# Patient Record
Sex: Male | Born: 1965 | Race: White | Hispanic: No | Marital: Married | Smoking: Never smoker
Health system: Southern US, Community
[De-identification: ages and names within clinical notes are randomized; demographics above are authoritative.]

## PROBLEM LIST (undated history)

## (undated) DIAGNOSIS — R1310 Dysphagia, unspecified: Secondary | ICD-10-CM

## (undated) DIAGNOSIS — Z87442 Personal history of urinary calculi: Secondary | ICD-10-CM

## (undated) DIAGNOSIS — E119 Type 2 diabetes mellitus without complications: Secondary | ICD-10-CM

## (undated) DIAGNOSIS — Z8619 Personal history of other infectious and parasitic diseases: Secondary | ICD-10-CM

## (undated) DIAGNOSIS — N200 Calculus of kidney: Secondary | ICD-10-CM

## (undated) DIAGNOSIS — K219 Gastro-esophageal reflux disease without esophagitis: Secondary | ICD-10-CM

## (undated) DIAGNOSIS — T7840XA Allergy, unspecified, initial encounter: Secondary | ICD-10-CM

## (undated) DIAGNOSIS — I10 Essential (primary) hypertension: Secondary | ICD-10-CM

## (undated) DIAGNOSIS — E7800 Pure hypercholesterolemia, unspecified: Secondary | ICD-10-CM

## (undated) HISTORY — DX: Pure hypercholesterolemia, unspecified: E78.00

## (undated) HISTORY — DX: Type 2 diabetes mellitus without complications: E11.9

## (undated) HISTORY — DX: Personal history of urinary calculi: Z87.442

## (undated) HISTORY — DX: Personal history of other infectious and parasitic diseases: Z86.19

## (undated) HISTORY — DX: Gastro-esophageal reflux disease without esophagitis: K21.9

## (undated) HISTORY — DX: Allergy, unspecified, initial encounter: T78.40XA

## (undated) HISTORY — DX: Calculus of kidney: N20.0

## (undated) HISTORY — DX: Essential (primary) hypertension: I10

## (undated) HISTORY — DX: Dysphagia, unspecified: R13.10

## (undated) HISTORY — PX: ESOPHAGEAL DILATION: SHX303

---
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
## 2007-01-10 ENCOUNTER — Emergency Department: Payer: Self-pay | Admitting: Emergency Medicine

## 2007-11-16 IMAGING — CT CT ABD-PELV W/O CM
1 of 2 series · 15 of 32 positions shown, 19 images · non-contrast
Comparison: none

REASON FOR EXAM: (1) L flank and abdominal pain,  stone protocol; (2) L
flank and abd pain
COMMENTS:

[Series 2: soft tissue · axial · 0.72mm/px · z∈[-1122,-678]mm · 15 of 168 slices shown, 19 images]
[im 13/168  soft-tissue]
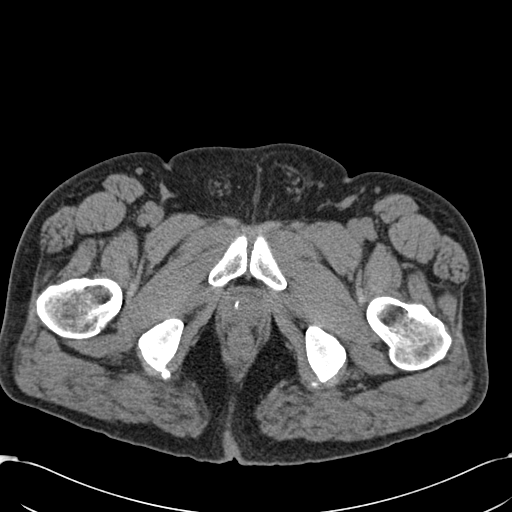
[im 13/168  bone]
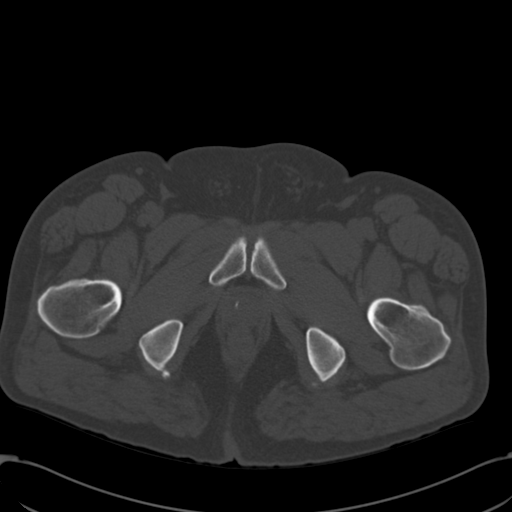
[im 25/168  soft-tissue]
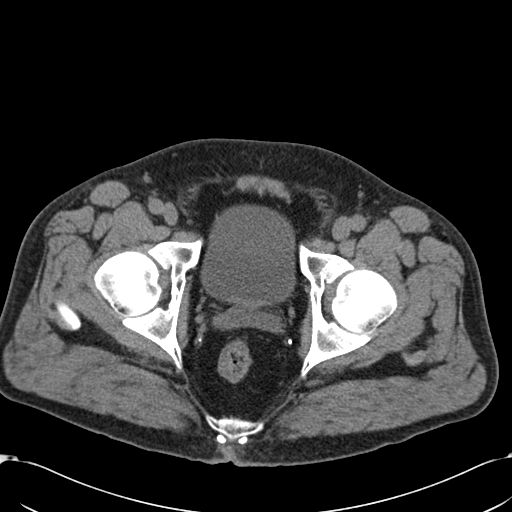
[im 38/168  soft-tissue]
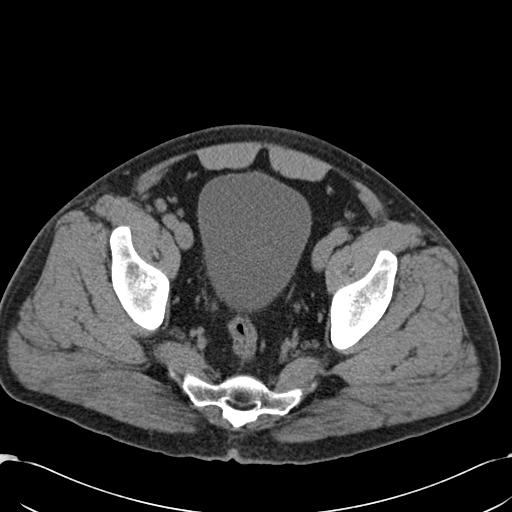
[im 50/168  soft-tissue]
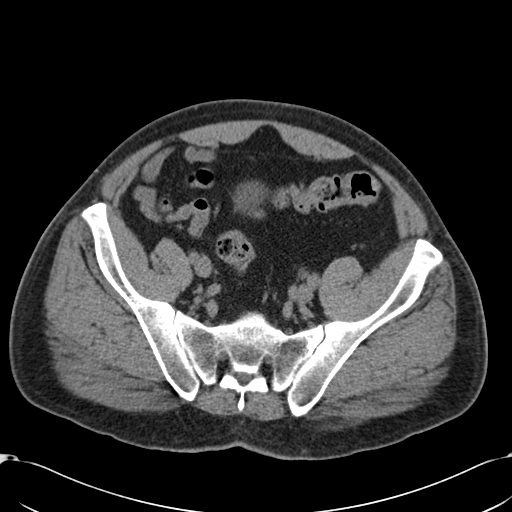
[im 62/168  soft-tissue]
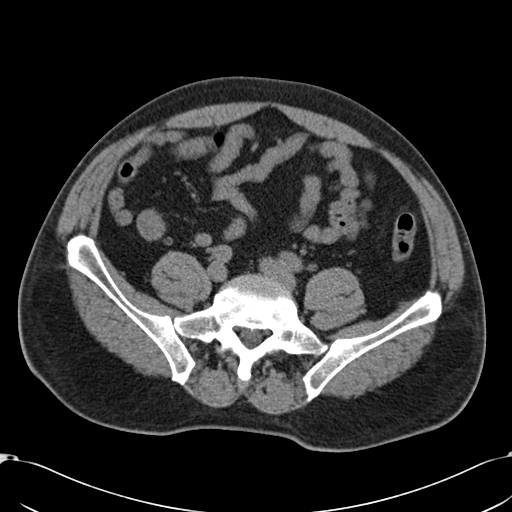
[im 75/168  soft-tissue]
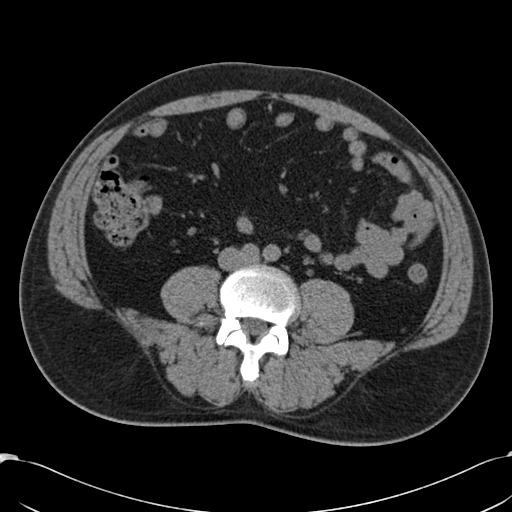
[im 87/168  soft-tissue]
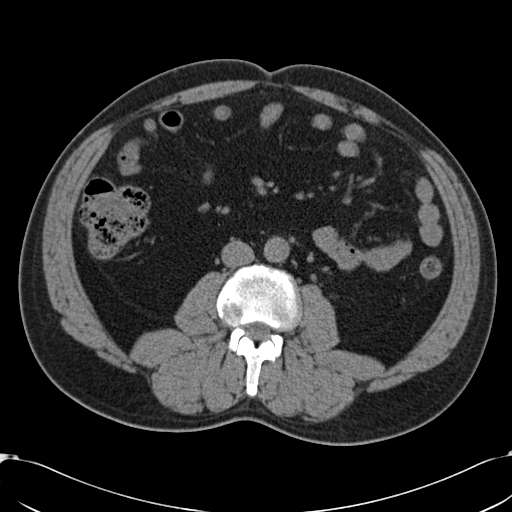
[im 99/168  soft-tissue]
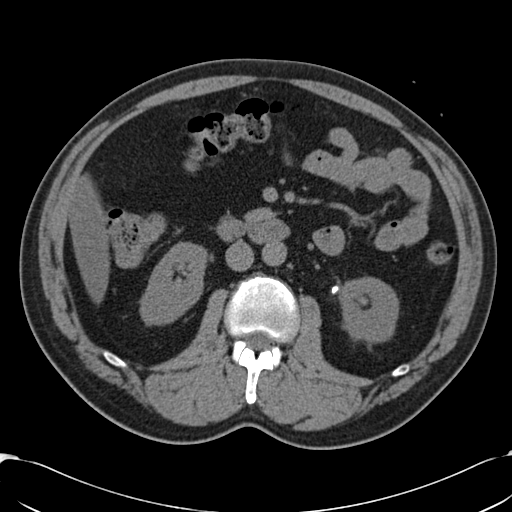
[im 112/168  soft-tissue]
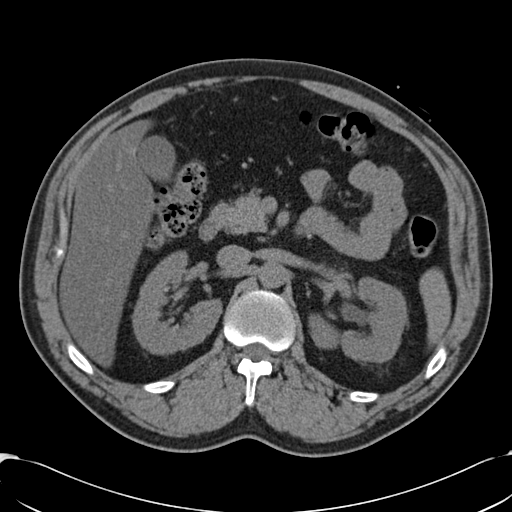
[im 112/168  bone]
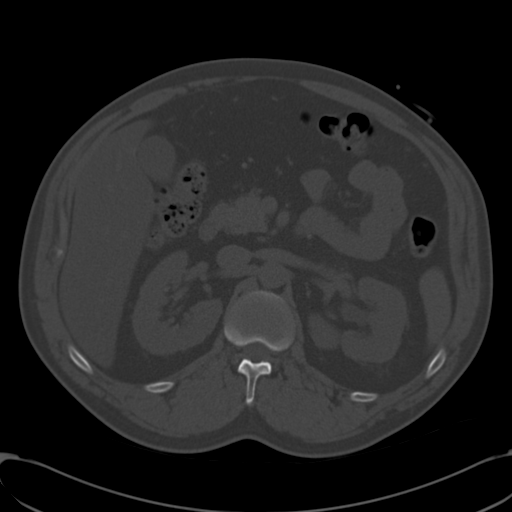
[im 124/168  soft-tissue]
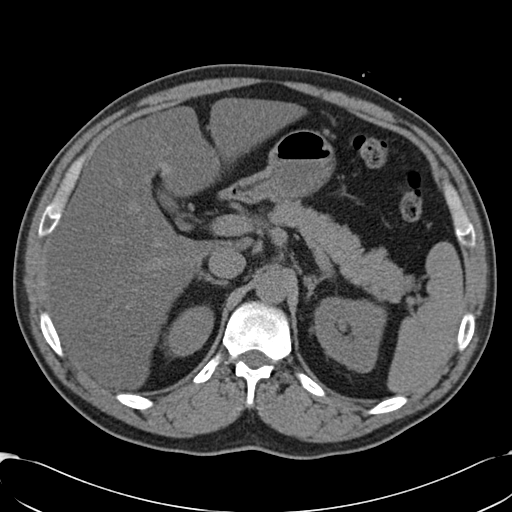
[im 137/168  soft-tissue]
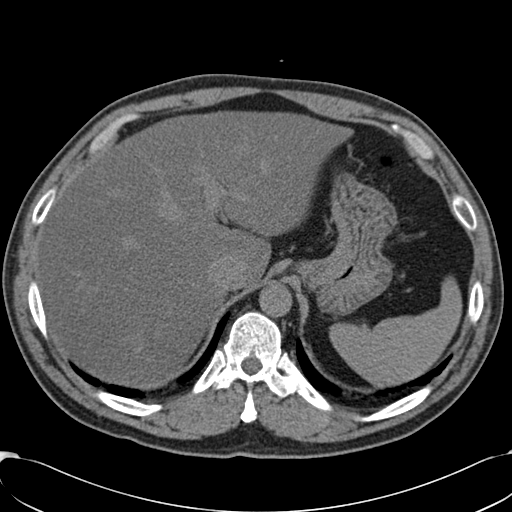
[im 143/168  lung]
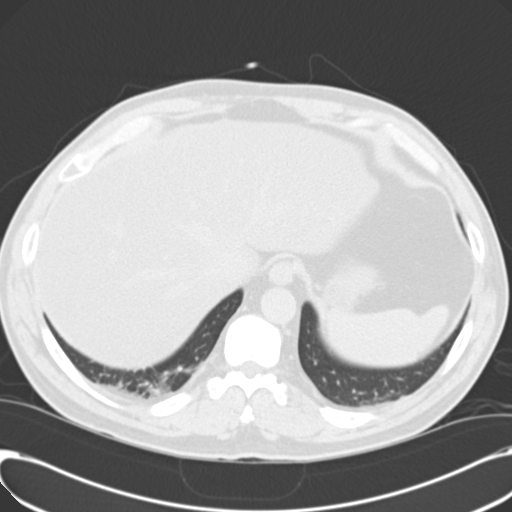
[im 149/168  soft-tissue]
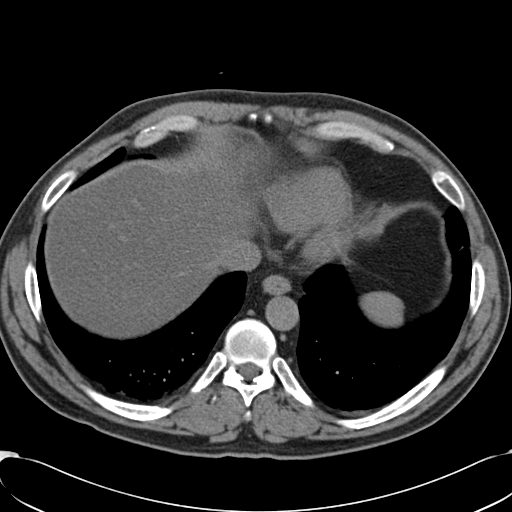
[im 149/168  lung]
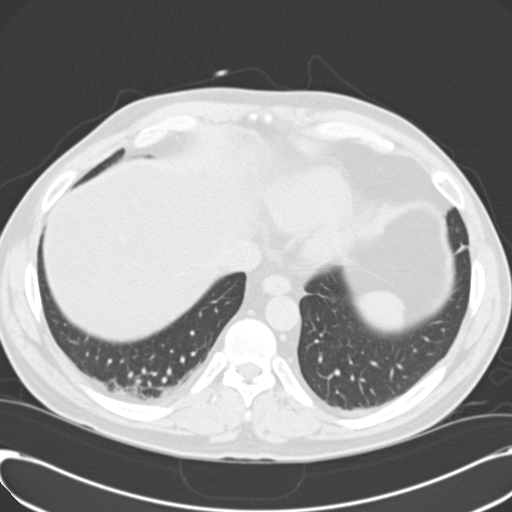
[im 155/168  lung]
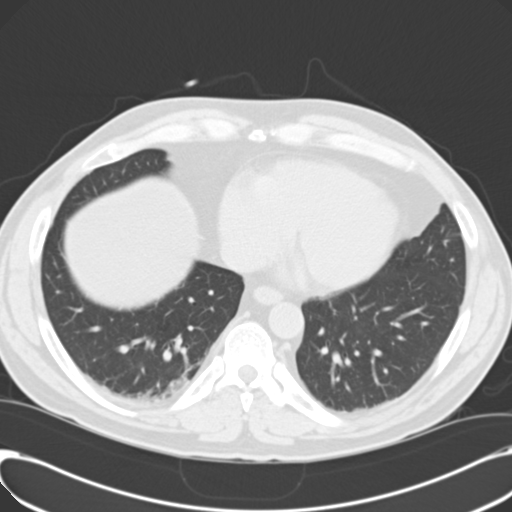
[im 161/168  soft-tissue]
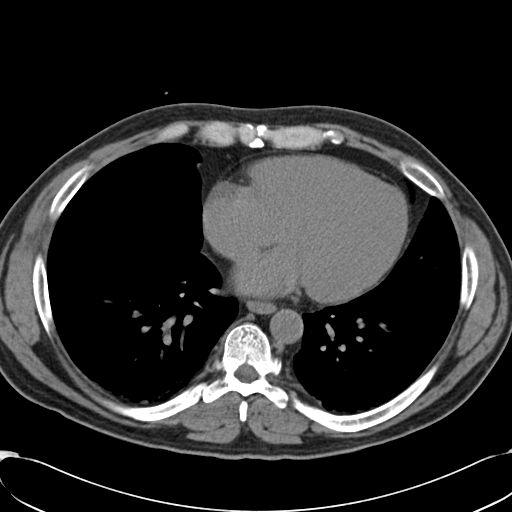
[im 161/168  lung]
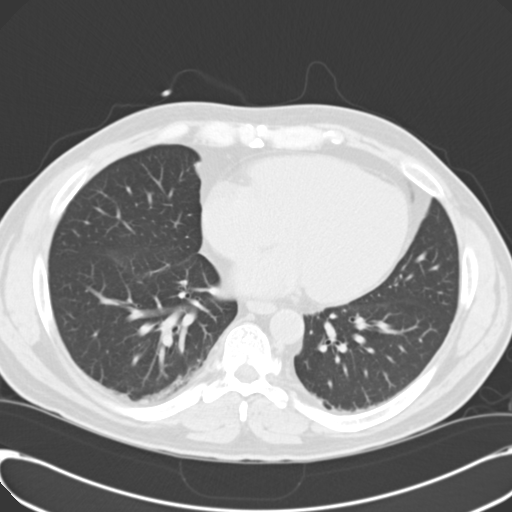

[15 of 32 positions shown; findings below may reference images not displayed]

PROCEDURE:     CT  - CT ABDOMEN AND PELVIS W[DATE]  [DATE]

RESULT:     The study was performed without IV contrast as requested. The
patient is complaining of LEFT flank discomfort.

There is mild hydronephrosis on the LEFT which is likely secondary to a
stone at the LEFT ureteropelvic junction which measures approximately 5 mm
in diameter. There is an additional, approximately 3 mm diameter stone
causing no obstruction lying in the LEFT renal pelvis. The RIGHT kidney
exhibits sub millimeter stones, but no evidence of obstruction.

The liver, gallbladder, spleen, nondistended stomach, pancreas, and adrenal
glands are normal in appearance. The caliber of the abdominal aorta is
normal. The unopacified loops of small and large bowel exhibit no acute
abnormality. The urinary bladder and prostate gland and seminal vesicles are
normal in appearance. The appendix is demonstrated and is normal in caliber.
The lung bases exhibit mild fibrosis in the posterior costophrenic gutters.
IMPRESSION: 1. There is very mild hydronephrosis on the LEFT, which is likely secondary
to an approximately 5 mm diameter stone at the ureteropelvic junction. An
additional 3 mm diameter non-obstructing stone in the LEFT renal pelvis is
seen. No obstruction on the RIGHT is identified.
2. I see no acute abnormality elsewhere within the abdomen or pelvis.

The findings were called to the Emergency Department the conclusion of the
study.

## 2012-11-30 ENCOUNTER — Ambulatory Visit: Payer: Self-pay | Admitting: Internal Medicine

## 2013-07-15 ENCOUNTER — Ambulatory Visit (INDEPENDENT_AMBULATORY_CARE_PROVIDER_SITE_OTHER): Payer: BC Managed Care – PPO | Admitting: Internal Medicine

## 2013-07-15 ENCOUNTER — Encounter: Payer: Self-pay | Admitting: Internal Medicine

## 2013-07-15 VITALS — BP 138/90 | HR 94 | Temp 98.6°F | Ht 68.0 in | Wt 196.5 lb

## 2013-07-15 DIAGNOSIS — I10 Essential (primary) hypertension: Secondary | ICD-10-CM

## 2013-07-15 DIAGNOSIS — N200 Calculus of kidney: Secondary | ICD-10-CM

## 2013-07-15 DIAGNOSIS — E119 Type 2 diabetes mellitus without complications: Secondary | ICD-10-CM

## 2013-07-15 DIAGNOSIS — E7800 Pure hypercholesterolemia, unspecified: Secondary | ICD-10-CM

## 2013-07-15 DIAGNOSIS — K219 Gastro-esophageal reflux disease without esophagitis: Secondary | ICD-10-CM

## 2013-07-15 NOTE — Progress Notes (Signed)
Pre-visit discussion using our clinic review tool. No additional management support is needed unless otherwise documented below in the visit note.  

## 2013-07-16 ENCOUNTER — Telehealth: Payer: Self-pay | Admitting: *Deleted

## 2013-07-16 DIAGNOSIS — E119 Type 2 diabetes mellitus without complications: Secondary | ICD-10-CM | POA: Insufficient documentation

## 2013-07-16 DIAGNOSIS — E7800 Pure hypercholesterolemia, unspecified: Secondary | ICD-10-CM | POA: Insufficient documentation

## 2013-07-16 DIAGNOSIS — Z125 Encounter for screening for malignant neoplasm of prostate: Secondary | ICD-10-CM

## 2013-07-16 DIAGNOSIS — I10 Essential (primary) hypertension: Secondary | ICD-10-CM

## 2013-07-16 NOTE — Telephone Encounter (Signed)
Pt is coming in tomorrow what labs and dx?  

## 2013-07-16 NOTE — Telephone Encounter (Signed)
Orders placed for labs

## 2013-07-17 ENCOUNTER — Other Ambulatory Visit (INDEPENDENT_AMBULATORY_CARE_PROVIDER_SITE_OTHER): Payer: BC Managed Care – PPO

## 2013-07-17 DIAGNOSIS — Z125 Encounter for screening for malignant neoplasm of prostate: Secondary | ICD-10-CM

## 2013-07-17 DIAGNOSIS — E7800 Pure hypercholesterolemia, unspecified: Secondary | ICD-10-CM

## 2013-07-17 DIAGNOSIS — E119 Type 2 diabetes mellitus without complications: Secondary | ICD-10-CM

## 2013-07-17 LAB — COMPREHENSIVE METABOLIC PANEL
ALT: 210 U/L — ABNORMAL HIGH (ref 0–53)
AST: 145 U/L — ABNORMAL HIGH (ref 0–37)
Albumin: 4.3 g/dL (ref 3.5–5.2)
Alkaline Phosphatase: 62 U/L (ref 39–117)
BUN: 11 mg/dL (ref 6–23)
CO2: 27 mEq/L (ref 19–32)
Calcium: 9.2 mg/dL (ref 8.4–10.5)
Chloride: 102 mEq/L (ref 96–112)
Creatinine, Ser: 0.8 mg/dL (ref 0.4–1.5)
GFR: 115.87 mL/min (ref >60.00)
Glucose, Bld: 237 mg/dL — ABNORMAL HIGH (ref 70–99)
Potassium: 4.9 mEq/L (ref 3.5–5.1)
Sodium: 136 mEq/L (ref 135–145)
Total Bilirubin: 1.2 mg/dL (ref 0.3–1.2)
Total Protein: 6.9 g/dL (ref 6.0–8.3)

## 2013-07-17 LAB — PSA: PSA: 0.18 ng/mL (ref 0.10–4.00)

## 2013-07-17 LAB — CBC WITH DIFFERENTIAL/PLATELET
Basophils Absolute: 0.1 10*3/uL (ref 0.0–0.1)
Basophils Relative: 1 % (ref 0.0–3.0)
Eosinophils Absolute: 0.2 10*3/uL (ref 0.0–0.7)
Eosinophils Relative: 3.4 % (ref 0.0–5.0)
HCT: 45.5 % (ref 39.0–52.0)
Hemoglobin: 15.7 g/dL (ref 13.0–17.0)
Lymphocytes Relative: 29.8 % (ref 12.0–46.0)
Lymphs Abs: 1.9 10*3/uL (ref 0.7–4.0)
MCHC: 34.5 g/dL (ref 30.0–36.0)
MCV: 97.9 fl (ref 78.0–100.0)
Monocytes Absolute: 0.5 10*3/uL (ref 0.1–1.0)
Monocytes Relative: 8.3 % (ref 3.0–12.0)
Neutro Abs: 3.7 10*3/uL (ref 1.4–7.7)
Neutrophils Relative %: 57.5 % (ref 43.0–77.0)
Platelets: 210 10*3/uL (ref 150.0–400.0)
RBC: 4.64 Mil/uL (ref 4.22–5.81)
RDW: 13.2 % (ref 11.5–14.6)
WBC: 6.5 10*3/uL (ref 4.5–10.5)

## 2013-07-17 LAB — LDL CHOLESTEROL, DIRECT: Direct LDL: 135.5 mg/dL

## 2013-07-17 LAB — MICROALBUMIN / CREATININE URINE RATIO
Creatinine,U: 139.5 mg/dL
Microalb Creat Ratio: 1.9 mg/g (ref 0.0–30.0)
Microalb, Ur: 2.6 mg/dL — ABNORMAL HIGH (ref 0.0–1.9)

## 2013-07-17 LAB — HEMOGLOBIN A1C: Hgb A1c MFr Bld: 9.9 % — ABNORMAL HIGH (ref 4.6–6.5)

## 2013-07-17 LAB — LIPID PANEL
Cholesterol: 227 mg/dL — ABNORMAL HIGH (ref 0–200)
HDL: 51.6 mg/dL (ref >39.00)
Total CHOL/HDL Ratio: 4
Triglycerides: 279 mg/dL — ABNORMAL HIGH (ref 0.0–149.0)
VLDL: 55.8 mg/dL — ABNORMAL HIGH (ref 0.0–40.0)

## 2013-07-17 LAB — TSH: TSH: 1.61 u[IU]/mL (ref 0.35–5.50)

## 2013-07-18 ENCOUNTER — Encounter: Payer: Self-pay | Admitting: Internal Medicine

## 2013-07-18 DIAGNOSIS — K219 Gastro-esophageal reflux disease without esophagitis: Secondary | ICD-10-CM | POA: Insufficient documentation

## 2013-07-18 DIAGNOSIS — N200 Calculus of kidney: Secondary | ICD-10-CM

## 2013-07-18 HISTORY — DX: Calculus of kidney: N20.0

## 2013-07-18 NOTE — Assessment & Plan Note (Signed)
Low cholesterol diet and exercise.  On no medication.  Check lipid profile.    

## 2013-07-18 NOTE — Assessment & Plan Note (Signed)
Brought in no recorded sugar readings.  Sugars as outlined.  States medications were just adjusted.  Was non compliant with bid dosing.  Is evaluated at [facility] for his eye exam.  Check metabolic panel and a1c.

## 2013-07-18 NOTE — Assessment & Plan Note (Signed)
Blood pressure on recheck improved.  Continue lisinopril.  Check metabolic panel.

## 2013-07-18 NOTE — Progress Notes (Signed)
   Subjective:    [name]: Phillip Shepard, male    DOB: 1966/04/09, 47 y.o.   MRN: [id number]  HPI 47 year old male with past history of hypertension, hypercholesterolemia, GERD with dysphagia s/p esophageal dilatation, allergies and diabetes.  He comes in today to follow up on these issues as well as to establish care.  Previously followed by Dr Park Breed.  States just had his medication adjusted to once daily dosing (secondary to noncompliance with multiple dosings per day.  Taking regularly now.  Has a history of reflux.  Omeprazole controls.  Swallowing better.  S/p esophageal dilatation.  Has seen Dr Markham Jordan.  No cardiac symptoms with increased activity or exertion.  Breathing stable.  Has a history of kidney stones.  Last episode 4-5 years ago.  No problems since.  Drinks 3-4 beers per day.  Discussed the need to cut back.     Past Medical History  Diagnosis Date  . History of chickenpox   . Diabetes mellitus without complication   . GERD (gastroesophageal reflux disease)   . Allergy   . Hypertension   . Hypercholesterolemia   . History of kidney stones   . Dysphagia     s/p esophageal dilatation    Outpatient Encounter Prescriptions as of 07/15/2013  Medication Sig  . glimepiride (AMARYL) 2 MG tablet Take 2 mg by mouth daily with breakfast.  . lisinopril (PRINIVIL,ZESTRIL) 20 MG tablet Take 20 mg by mouth daily.  Marland Kitchen omeprazole (PRILOSEC) 20 MG capsule Take 20 mg by mouth daily.  . SitaGLIPtin-MetFORMIN HCl (JANUMET XR) [phone] MG TB24 Take by mouth daily.    Review of Systems Patient denies any headache, lightheadedness or dizziness.  No sinus or allergy symptoms.  No chest pain, tightness or palpitations.  No increased shortness of breath, cough or congestion.  No nausea or vomiting.  Acid reflux controlled on omeprazole.  No abdominal pain or cramping.  No bowel change, such as diarrhea, constipation, BRBPR or melana.  No urine change.   No swallowing problems.  Just had his  medications adjusted.  Blood sugars averaging 190-220.  Brought in no recorded sugar readings.       Objective:   Physical Exam Filed Vitals:   07/15/13 1144  BP: 138/90  Pulse: 94  Temp: 98.6 F (37 C)   Blood pressure recheck:  130/82, pulse 36  47 year old male in no acute distress.  HEENT:  Nares - clear.  Oropharynx - without lesions. NECK:  Supple.  Nontender.  No audible carotid bruit.  HEART:  Appears to be regular.   LUNGS:  No crackles or wheezing audible.  Respirations even and unlabored.   RADIAL PULSE:  Equal bilaterally.  ABDOMEN:  Soft.  Nontender.  Bowel sounds present and normal.  No audible abdominal bruit.  EXTREMITIES:  No increased edema present.  DP pulses palpable and equal bilaterally.  FEET:  No lesions.       Assessment & Plan:  HEALTH MAINTENANCE.  Schedule her for a physical.  Check routine labs and PSA.    I spent 45 minutes with the patient and more than 50% of the time was spent in consultation regarding the above.

## 2013-07-18 NOTE — Assessment & Plan Note (Signed)
Has a history of kidney stones.  Last stone - 4-5 years ago.  Currently asymptomatic.  Follow.   

## 2013-07-18 NOTE — Assessment & Plan Note (Signed)
Reflux controlled on omeprazole.  Swallowing better after esophageal dilatation.  Follow.   

## 2013-07-19 ENCOUNTER — Other Ambulatory Visit: Payer: Self-pay | Admitting: Internal Medicine

## 2013-07-19 DIAGNOSIS — R945 Abnormal results of liver function studies: Secondary | ICD-10-CM

## 2013-07-19 NOTE — Progress Notes (Signed)
Order placed for f/u labs.  

## 2013-07-22 ENCOUNTER — Encounter: Payer: Self-pay | Admitting: *Deleted

## 2013-08-08 ENCOUNTER — Encounter: Payer: Self-pay | Admitting: *Deleted

## 2013-10-25 ENCOUNTER — Encounter (INDEPENDENT_AMBULATORY_CARE_PROVIDER_SITE_OTHER): Payer: Self-pay

## 2013-10-25 ENCOUNTER — Other Ambulatory Visit: Payer: Self-pay | Admitting: Internal Medicine

## 2013-10-25 ENCOUNTER — Encounter: Payer: Self-pay | Admitting: Internal Medicine

## 2013-10-25 ENCOUNTER — Ambulatory Visit (INDEPENDENT_AMBULATORY_CARE_PROVIDER_SITE_OTHER): Payer: BC Managed Care – PPO | Admitting: Internal Medicine

## 2013-10-25 VITALS — BP 130/80 | HR 85 | Temp 98.4°F | Ht 68.0 in | Wt 199.2 lb

## 2013-10-25 DIAGNOSIS — N200 Calculus of kidney: Secondary | ICD-10-CM

## 2013-10-25 DIAGNOSIS — E119 Type 2 diabetes mellitus without complications: Secondary | ICD-10-CM

## 2013-10-25 DIAGNOSIS — I10 Essential (primary) hypertension: Secondary | ICD-10-CM

## 2013-10-25 DIAGNOSIS — R945 Abnormal results of liver function studies: Secondary | ICD-10-CM

## 2013-10-25 DIAGNOSIS — E7800 Pure hypercholesterolemia, unspecified: Secondary | ICD-10-CM

## 2013-10-25 DIAGNOSIS — F1010 Alcohol abuse, uncomplicated: Secondary | ICD-10-CM

## 2013-10-25 DIAGNOSIS — R7989 Other specified abnormal findings of blood chemistry: Secondary | ICD-10-CM

## 2013-10-25 DIAGNOSIS — K219 Gastro-esophageal reflux disease without esophagitis: Secondary | ICD-10-CM

## 2013-10-25 MED ORDER — LISINOPRIL 20 MG PO TABS: 20.0000 mg | ORAL_TABLET | Freq: Every day | ORAL | Status: DC

## 2013-10-25 MED ORDER — FLUTICASONE PROPIONATE 50 MCG/ACT NA SUSP: 2.0000 | Freq: Every day | NASAL | Status: DC

## 2013-10-25 MED ORDER — SITAGLIP PHOS-METFORMIN HCL ER 100-1000 MG PO TB24: ORAL_TABLET | ORAL | Status: DC

## 2013-10-25 MED ORDER — GLIMEPIRIDE 2 MG PO TABS: 2.0000 mg | ORAL_TABLET | Freq: Every day | ORAL | Status: DC

## 2013-10-25 NOTE — Patient Instructions (Signed)
Flonase nasal spray - 2 sprays each nostril one time per day. Robitussin DM as directed.

## 2013-10-25 NOTE — Progress Notes (Signed)
F/u labs ordered.   

## 2013-10-27 ENCOUNTER — Encounter: Payer: Self-pay | Admitting: Internal Medicine

## 2013-10-27 DIAGNOSIS — R7989 Other specified abnormal findings of blood chemistry: Secondary | ICD-10-CM | POA: Insufficient documentation

## 2013-10-27 DIAGNOSIS — R945 Abnormal results of liver function studies: Secondary | ICD-10-CM | POA: Insufficient documentation

## 2013-10-27 DIAGNOSIS — F1010 Alcohol abuse, uncomplicated: Secondary | ICD-10-CM | POA: Insufficient documentation

## 2013-10-27 DIAGNOSIS — F1020 Alcohol dependence, uncomplicated: Secondary | ICD-10-CM | POA: Insufficient documentation

## 2013-10-27 NOTE — Assessment & Plan Note (Signed)
Has cut down on his alcohol intake.  Follow.

## 2013-10-27 NOTE — Assessment & Plan Note (Signed)
Has a history of kidney stones.  Last stone - 4-5 years ago.  Currently asymptomatic.  Follow.

## 2013-10-27 NOTE — Progress Notes (Signed)
Subjective:    [name]: Phillip Shepard, male    DOB: 01/19/1966, 48 y.o.   MRN: [id number]  HPI 48 year old male with past history of hypertension, hypercholesterolemia, GERD with dysphagia s/p esophageal dilatation, allergies and diabetes.  He comes in today for a scheduled follow up.  Did not want his physical today.   States just had his medication adjusted to once daily dosing (secondary to noncompliance with multiple dosings per day.  Taking regularly now.  Was off his medication with last labs.  Has a history of reflux.  Omeprazole controls.  Swallowing better.  S/p esophageal dilatation.  Has seen Dr Markham JordanElliot.  No cardiac symptoms with increased activity or exertion.  Breathing stable. Has a history of kidney stones.  Last episode 4-5 years ago.  No problems since.  He has cut back on his alcohol intake.  Not checking his sugars.  Discussed diet modification.  Has had some cough.  No fever.  No significant chest congestion. No sob or wheezing.    Past Medical History  Diagnosis Date  . History of chickenpox   . Diabetes mellitus without complication   . GERD (gastroesophageal reflux disease)   . Allergy   . Hypertension   . Hypercholesterolemia   . History of kidney stones   . Dysphagia     s/p esophageal dilatation  . Kidney stones 07/18/2013    Outpatient Encounter Prescriptions as of 10/25/2013  Medication Sig  . glimepiride (AMARYL) 2 MG tablet Take 1 tablet (2 mg total) by mouth daily with breakfast.  . lisinopril (PRINIVIL,ZESTRIL) 20 MG tablet Take 1 tablet (20 mg total) by mouth daily.  Marland Kitchen. omeprazole (PRILOSEC) 20 MG capsule Take 20 mg by mouth daily.  . SitaGLIPtin-MetFORMIN HCl (JANUMET XR) [phone] MG TB24 One tablet q day  . [DISCONTINUED] glimepiride (AMARYL) 2 MG tablet Take 2 mg by mouth daily with breakfast.  . [DISCONTINUED] lisinopril (PRINIVIL,ZESTRIL) 20 MG tablet Take 20 mg by mouth daily.  . [DISCONTINUED] SitaGLIPtin-MetFORMIN HCl (JANUMET XR) [phone] MG  TB24 Take by mouth daily.  . fluticasone (FLONASE) 50 MCG/ACT nasal spray Place 2 sprays into both nostrils daily.    Review of Systems Patient denies any headache, lightheadedness or dizziness.  No sinus congestion.   No chest pain, tightness or palpitations.  No increased shortness of breath.  Does report some cough.  No wheezing.  No chest congestion.  No nausea or vomiting.  Acid reflux controlled on omeprazole.  No abdominal pain or cramping.  No bowel change, such as diarrhea, constipation, BRBPR or melana.  No urine change.   No swallowing problems.   Brought in no recorded sugar readings.  Taking his medications.       Objective:   Physical Exam  Filed Vitals:   10/25/13 0843  BP: 130/80  Pulse: 85  Temp: 98.4 F (36.9 C)   Blood pressure recheck:  64128/6080  48 year old male in no acute distress.  HEENT:  Nares - clear.  Oropharynx - without lesions. NECK:  Supple.  Nontender.  No audible carotid bruit.  HEART:  Appears to be regular.   LUNGS:  No crackles or wheezing audible.  Respirations even and unlabored.   RADIAL PULSE:  Equal bilaterally.  ABDOMEN:  Soft.  Nontender.  Bowel sounds present and normal.  No audible abdominal bruit.  EXTREMITIES:  No increased edema present.  DP pulses palpable and equal bilaterally.  FEET:  No lesions.       Assessment &  Plan:  COUGH.  No chest congestion.  Lungs clear.  Treat with mucinex DM and robitussin DM as directed.  Follow.  Notify me if persistent.    HEALTH MAINTENANCE.  Schedule him for a physical.  PSA .18 (07/17/13).    I spent 25 minutes with the patient and more than 50% of the time was spent in consultation regarding the above.

## 2013-10-27 NOTE — Assessment & Plan Note (Signed)
Reflux controlled on omeprazole.  Swallowing better after esophageal dilatation.  Follow.   

## 2013-10-27 NOTE — Assessment & Plan Note (Signed)
Blood pressure as outlined.  Continue lisinopril.  Check metabolic panel.  

## 2013-10-27 NOTE — Assessment & Plan Note (Signed)
Brought in no recorded sugar readings.   States medications were just adjusted.  Was non compliant with bid dosing.  Is evaluated at [facility] for his eye exam.  Check metabolic panel and a1c.  Discussed the need to check and record sugars.  Discussed diet modifications and exercise.

## 2013-10-27 NOTE — Assessment & Plan Note (Signed)
Increased liver function.  Recheck liver panel.  Has cut down his alcohol intake.  Check abdominal ultrasound.  Further w/up pending.

## 2013-10-27 NOTE — Assessment & Plan Note (Signed)
Low cholesterol diet and exercise.  On no medication.  Check lipid profile.    

## 2013-11-01 ENCOUNTER — Other Ambulatory Visit (INDEPENDENT_AMBULATORY_CARE_PROVIDER_SITE_OTHER): Payer: BC Managed Care – PPO

## 2013-11-01 DIAGNOSIS — E119 Type 2 diabetes mellitus without complications: Secondary | ICD-10-CM

## 2013-11-01 DIAGNOSIS — R945 Abnormal results of liver function studies: Secondary | ICD-10-CM

## 2013-11-01 DIAGNOSIS — E7800 Pure hypercholesterolemia, unspecified: Secondary | ICD-10-CM

## 2013-11-01 DIAGNOSIS — R7989 Other specified abnormal findings of blood chemistry: Secondary | ICD-10-CM

## 2013-11-01 LAB — BASIC METABOLIC PANEL
BUN: 12 mg/dL (ref 6–23)
CO2: 24 mEq/L (ref 19–32)
Calcium: 9.3 mg/dL (ref 8.4–10.5)
Chloride: 104 mEq/L (ref 96–112)
Creatinine, Ser: 0.8 mg/dL (ref 0.4–1.5)
GFR: 109.08 mL/min (ref >60.00)
Glucose, Bld: 290 mg/dL — ABNORMAL HIGH (ref 70–99)
Potassium: 3.9 mEq/L (ref 3.5–5.1)
Sodium: 137 mEq/L (ref 135–145)

## 2013-11-01 LAB — HEPATIC FUNCTION PANEL
ALT: 206 U/L — ABNORMAL HIGH (ref 0–53)
AST: 191 U/L — ABNORMAL HIGH (ref 0–37)
Albumin: 4.4 g/dL (ref 3.5–5.2)
Alkaline Phosphatase: 62 U/L (ref 39–117)
Bilirubin, Direct: 0.2 mg/dL (ref 0.0–0.3)
Total Bilirubin: 1.2 mg/dL (ref 0.3–1.2)
Total Protein: 7.3 g/dL (ref 6.0–8.3)

## 2013-11-01 LAB — LIPID PANEL
Cholesterol: 246 mg/dL — ABNORMAL HIGH (ref 0–200)
HDL: 44 mg/dL (ref >39.00)
LDL Cholesterol: 150 mg/dL — ABNORMAL HIGH (ref 0–99)
Total CHOL/HDL Ratio: 6
Triglycerides: 260 mg/dL — ABNORMAL HIGH (ref 0.0–149.0)
VLDL: 52 mg/dL — ABNORMAL HIGH (ref 0.0–40.0)

## 2013-11-01 LAB — HEMOGLOBIN A1C: Hgb A1c MFr Bld: 10.4 % — ABNORMAL HIGH (ref 4.6–6.5)

## 2013-11-02 LAB — HEPATITIS B SURFACE ANTIBODY,QUALITATIVE: Hep B S Ab: NEGATIVE

## 2013-11-02 LAB — HEPATITIS B CORE ANTIBODY, IGM: Hep B C IgM: NONREACTIVE

## 2013-11-02 LAB — HEPATITIS B SURFACE ANTIGEN: Hepatitis B Surface Ag: NEGATIVE

## 2013-11-02 LAB — HEPATITIS C ANTIBODY: HCV Ab: NEGATIVE

## 2013-11-02 LAB — HEPATITIS B CORE ANTIBODY, TOTAL: Hep B Core Total Ab: NONREACTIVE

## 2013-11-04 LAB — HEPATITIS B E ANTIGEN: Hepatitis Be Antigen: NONREACTIVE

## 2013-11-05 ENCOUNTER — Other Ambulatory Visit: Payer: Self-pay | Admitting: Internal Medicine

## 2013-11-05 DIAGNOSIS — R945 Abnormal results of liver function studies: Secondary | ICD-10-CM

## 2013-11-05 DIAGNOSIS — R7989 Other specified abnormal findings of blood chemistry: Secondary | ICD-10-CM

## 2013-11-05 NOTE — Progress Notes (Signed)
Order placed for abdominal ultrasound.   

## 2013-11-14 ENCOUNTER — Ambulatory Visit: Payer: BC Managed Care – PPO | Admitting: Internal Medicine

## 2013-11-15 ENCOUNTER — Telehealth: Payer: Self-pay | Admitting: Internal Medicine

## 2013-11-15 NOTE — Telephone Encounter (Signed)
Pt came in today for his appt that was 3/26.  Pt states he was called and told to come in, but he got confused on the date, thought today was 3/26.  Next available slot mid-April.  Pt states he needs to be seen before then, he thinks she is changing his medication.  Please advise when to schedule.  Prefers first thing in mornings.

## 2013-11-18 NOTE — Telephone Encounter (Signed)
See if he can come in at 4:00 on 11/19/13.  Thanks.

## 2013-11-18 NOTE — Telephone Encounter (Signed)
Pt aware of appointment 

## 2013-11-19 ENCOUNTER — Ambulatory Visit (INDEPENDENT_AMBULATORY_CARE_PROVIDER_SITE_OTHER): Payer: BC Managed Care – PPO | Admitting: Internal Medicine

## 2013-11-19 ENCOUNTER — Encounter: Payer: Self-pay | Admitting: Internal Medicine

## 2013-11-19 VITALS — BP 112/72 | HR 91 | Temp 98.9°F | Ht 68.0 in | Wt 195.5 lb

## 2013-11-19 DIAGNOSIS — F1010 Alcohol abuse, uncomplicated: Secondary | ICD-10-CM

## 2013-11-19 DIAGNOSIS — E119 Type 2 diabetes mellitus without complications: Secondary | ICD-10-CM

## 2013-11-19 DIAGNOSIS — R7989 Other specified abnormal findings of blood chemistry: Secondary | ICD-10-CM

## 2013-11-19 DIAGNOSIS — I10 Essential (primary) hypertension: Secondary | ICD-10-CM

## 2013-11-19 DIAGNOSIS — R945 Abnormal results of liver function studies: Secondary | ICD-10-CM

## 2013-11-19 NOTE — Progress Notes (Signed)
Pre-visit discussion using our clinic review tool. No additional management support is needed unless otherwise documented below in the visit note.  

## 2013-11-20 ENCOUNTER — Encounter: Payer: Self-pay | Admitting: *Deleted

## 2013-11-20 ENCOUNTER — Ambulatory Visit: Payer: BC Managed Care – PPO | Admitting: Internal Medicine

## 2013-11-24 ENCOUNTER — Encounter: Payer: Self-pay | Admitting: Internal Medicine

## 2013-11-24 NOTE — Assessment & Plan Note (Signed)
Has cut down on his alcohol intake.  Follow.

## 2013-11-24 NOTE — Assessment & Plan Note (Signed)
Blood pressure as outlined.  Continue lisinopril.  Check metabolic panel.  

## 2013-11-24 NOTE — Assessment & Plan Note (Signed)
Brought in no recorded sugar readings.  Was non compliant with bid dosing.  Is evaluated at [facility] for his eye exam.   Discussed the need to check and record sugars.  Discussed diet modifications and exercise.  Will change his medications to Janumet XR 50/1000 2 q day.  Continue amaryl.  Follow sugars.  May need to add Invokana if not controlled.

## 2013-11-24 NOTE — Assessment & Plan Note (Signed)
Increased liver function.   Has cut down his alcohol intake.  Check abdominal ultrasound.  Has not followed through with the testing.  Further w/up pending.

## 2013-11-24 NOTE — Progress Notes (Signed)
   Subjective:    [name]: Phillip Shepard, male    DOB: 02/23/1966, 48 y.o.   MRN: [id number]  HPI 48 year old male with past history of hypertension, hypercholesterolemia, GERD with dysphagia s/p esophageal dilatation, allergies and diabetes.  He comes in today for a scheduled follow up.  Here to discuss his blood sugars.  Poorly controlled.  Not checking his sugar.  Not watching what he eats.   Takes his medication once daily secondary to noncompliance with multiple dosings per day.  Taking regularly now.    He has cut back on his alcohol intake.  Discussed diet modification.  Discussed the need to check his sugars regularly.     Past Medical History  Diagnosis Date  . History of chickenpox   . Diabetes mellitus without complication   . GERD (gastroesophageal reflux disease)   . Allergy   . Hypertension   . Hypercholesterolemia   . History of kidney stones   . Dysphagia     s/p esophageal dilatation  . Kidney stones 07/18/2013    Outpatient Encounter Prescriptions as of 11/19/2013  Medication Sig  . glimepiride (AMARYL) 2 MG tablet Take 1 tablet (2 mg total) by mouth daily with breakfast.  . lisinopril (PRINIVIL,ZESTRIL) 20 MG tablet Take 1 tablet (20 mg total) by mouth daily.  . ranitidine (ZANTAC) 150 MG tablet Take 150 mg by mouth daily as needed for heartburn.  . SitaGLIPtin-MetFORMIN HCl (JANUMET XR) 50-1000 MG TB24 Take 2 tablets by mouth daily.  . [DISCONTINUED] SitaGLIPtin-MetFORMIN HCl (JANUMET XR) [phone] MG TB24 One tablet q day  . [DISCONTINUED] fluticasone (FLONASE) 50 MCG/ACT nasal spray Place 2 sprays into both nostrils daily.  . [DISCONTINUED] omeprazole (PRILOSEC) 20 MG capsule Take 20 mg by mouth daily.    Review of Systems Patient denies any headache, lightheadedness or dizziness.  No sinus congestion.   No chest pain, tightness or palpitations.  No increased shortness of breath.  No chest congestion.  No nausea or vomiting.  Acid reflux controlled on  omeprazole.  No abdominal pain or cramping.  No bowel change, such as diarrhea, constipation, BRBPR or melana. Previous diarrhea has resolved.   Brought in no recorded sugar readings.  Taking his medications.       Objective:   Physical Exam  Filed Vitals:   11/19/13 1558  BP: 112/72  Pulse: 91  Temp: 98.9 F (6337.492 C)   48 year old male in no acute distress.  HEENT:  Nares - clear.  Oropharynx - without lesions. NECK:  Supple.  Nontender.  No audible carotid bruit.  HEART:  Appears to be regular.   LUNGS:  No crackles or wheezing audible.  Respirations even and unlabored.   RADIAL PULSE:  Equal bilaterally.  ABDOMEN:  Soft.  Nontender.  Bowel sounds present and normal.  No audible abdominal bruit.  EXTREMITIES:  No increased edema present.  DP pulses palpable and equal bilaterally.  FEET:  No lesions.       Assessment & Plan:  HEALTH MAINTENANCE.  Schedule him for a physical.  PSA .18 (07/17/13).

## 2013-12-11 ENCOUNTER — Telehealth: Payer: Self-pay | Admitting: Internal Medicine

## 2013-12-11 ENCOUNTER — Other Ambulatory Visit: Payer: Self-pay | Admitting: *Deleted

## 2013-12-11 MED ORDER — SITAGLIP PHOS-METFORMIN HCL ER 50-1000 MG PO TB24: 2.0000 | ORAL_TABLET | Freq: Every day | ORAL | Status: DC

## 2013-12-11 NOTE — Telephone Encounter (Signed)
Left pt a detailed message to notify him that I have placed a coupon & our last box of Janumet XR 50/1000mg  samples up front. Also informed him that I would send in a Rx & that we would no longer be able to give our samples.

## 2013-12-11 NOTE — Telephone Encounter (Signed)
Pt wanted to know if he could get more samples of his diabatic meds wasn't for sure the name  If no samples please send  rx sent [facility]

## 2013-12-27 ENCOUNTER — Ambulatory Visit: Payer: BC Managed Care – PPO | Admitting: Internal Medicine

## 2014-01-21 ENCOUNTER — Other Ambulatory Visit: Payer: Self-pay | Admitting: Internal Medicine

## 2014-01-24 ENCOUNTER — Ambulatory Visit: Payer: BC Managed Care – PPO | Admitting: Internal Medicine

## 2014-01-27 ENCOUNTER — Other Ambulatory Visit: Payer: Self-pay | Admitting: Internal Medicine

## 2014-02-03 ENCOUNTER — Ambulatory Visit (INDEPENDENT_AMBULATORY_CARE_PROVIDER_SITE_OTHER): Payer: BC Managed Care – PPO | Admitting: Internal Medicine

## 2014-02-03 ENCOUNTER — Encounter (INDEPENDENT_AMBULATORY_CARE_PROVIDER_SITE_OTHER): Payer: Self-pay

## 2014-02-03 ENCOUNTER — Encounter: Payer: Self-pay | Admitting: Internal Medicine

## 2014-02-03 VITALS — BP 130/70 | HR 97 | Temp 98.9°F | Ht 68.0 in | Wt 193.2 lb

## 2014-02-03 DIAGNOSIS — F1010 Alcohol abuse, uncomplicated: Secondary | ICD-10-CM

## 2014-02-03 DIAGNOSIS — R945 Abnormal results of liver function studies: Secondary | ICD-10-CM

## 2014-02-03 DIAGNOSIS — K219 Gastro-esophageal reflux disease without esophagitis: Secondary | ICD-10-CM

## 2014-02-03 DIAGNOSIS — E119 Type 2 diabetes mellitus without complications: Secondary | ICD-10-CM

## 2014-02-03 DIAGNOSIS — I10 Essential (primary) hypertension: Secondary | ICD-10-CM

## 2014-02-03 DIAGNOSIS — M25511 Pain in right shoulder: Secondary | ICD-10-CM

## 2014-02-03 DIAGNOSIS — E7800 Pure hypercholesterolemia, unspecified: Secondary | ICD-10-CM

## 2014-02-03 DIAGNOSIS — R7989 Other specified abnormal findings of blood chemistry: Secondary | ICD-10-CM

## 2014-02-03 DIAGNOSIS — M25519 Pain in unspecified shoulder: Secondary | ICD-10-CM

## 2014-02-03 LAB — LIPID PANEL
Cholesterol: 226 mg/dL — ABNORMAL HIGH (ref 0–200)
HDL: 47.9 mg/dL (ref >39.00)
LDL Cholesterol: 129 mg/dL — ABNORMAL HIGH (ref 0–99)
NonHDL: 178.1
Total CHOL/HDL Ratio: 5
Triglycerides: 246 mg/dL — ABNORMAL HIGH (ref 0.0–149.0)
VLDL: 49.2 mg/dL — ABNORMAL HIGH (ref 0.0–40.0)

## 2014-02-03 LAB — HEPATIC FUNCTION PANEL
ALT: 132 U/L — ABNORMAL HIGH (ref 0–53)
AST: 74 U/L — ABNORMAL HIGH (ref 0–37)
Albumin: 4.8 g/dL (ref 3.5–5.2)
Alkaline Phosphatase: 50 U/L (ref 39–117)
Bilirubin, Direct: 0.1 mg/dL (ref 0.0–0.3)
Total Bilirubin: 0.6 mg/dL (ref 0.2–1.2)
Total Protein: 7.4 g/dL (ref 6.0–8.3)

## 2014-02-03 LAB — BASIC METABOLIC PANEL
BUN: 13 mg/dL (ref 6–23)
CO2: 24 mEq/L (ref 19–32)
Calcium: 9.7 mg/dL (ref 8.4–10.5)
Chloride: 100 mEq/L (ref 96–112)
Creatinine, Ser: 0.9 mg/dL (ref 0.4–1.5)
GFR: 92.74 mL/min (ref >60.00)
Glucose, Bld: 222 mg/dL — ABNORMAL HIGH (ref 70–99)
Potassium: 5.4 mEq/L — ABNORMAL HIGH (ref 3.5–5.1)
Sodium: 134 mEq/L — ABNORMAL LOW (ref 135–145)

## 2014-02-03 LAB — HEMOGLOBIN A1C: Hgb A1c MFr Bld: 8.6 % — ABNORMAL HIGH (ref 4.6–6.5)

## 2014-02-03 NOTE — Progress Notes (Signed)
Pre visit review using our clinic review tool, if applicable. No additional management support is needed unless otherwise documented below in the visit note. 

## 2014-02-04 ENCOUNTER — Other Ambulatory Visit: Payer: Self-pay | Admitting: Internal Medicine

## 2014-02-04 DIAGNOSIS — E878 Other disorders of electrolyte and fluid balance, not elsewhere classified: Secondary | ICD-10-CM

## 2014-02-04 NOTE — Progress Notes (Signed)
Order placed for f/u sodium and potassium.   

## 2014-02-10 ENCOUNTER — Encounter: Payer: Self-pay | Admitting: Internal Medicine

## 2014-02-10 ENCOUNTER — Encounter: Payer: BC Managed Care – PPO | Admitting: Internal Medicine

## 2014-02-10 DIAGNOSIS — M25511 Pain in right shoulder: Secondary | ICD-10-CM | POA: Insufficient documentation

## 2014-02-10 NOTE — Assessment & Plan Note (Signed)
Brought in no recorded sugar readings.  Reported sugar readings as outlined.  Better.  Is evaluated at [facility] for his eye exam.   Discussed the need to check and record sugars.  Discussed diet modifications and exercise.  States he is doing better watching his diet.  Taking Janumet XR 50/1000 bid.  Continue amaryl.  Follow sugars.  May need to add Invokana if not controlled.  Check metabolic panel and a1c.

## 2014-02-10 NOTE — Assessment & Plan Note (Signed)
Low cholesterol diet and exercise.  On no medication.  Check lipid profile.

## 2014-02-10 NOTE — Assessment & Plan Note (Signed)
Has cut down on his alcohol intake.  Discussed the need to quit.  Recheck liver panel today.

## 2014-02-10 NOTE — Assessment & Plan Note (Signed)
Persistent shoulder and arm pain as outlined.  Wanted to pursue further evaluation.  Discussed referral to ortho for possible injection.  Need to limit pain medication given his liver function.  He declines further evaluation, w/up or xray at this time.  Will notify me if he changes his mind.

## 2014-02-10 NOTE — Assessment & Plan Note (Signed)
Reflux controlled on omeprazole.  Swallowing better after esophageal dilatation.  Follow.

## 2014-02-10 NOTE — Assessment & Plan Note (Signed)
Blood pressure as outlined.  Continue lisinopril.  Check metabolic panel.  

## 2014-02-10 NOTE — Assessment & Plan Note (Signed)
Increased liver function.   Has cut down his alcohol intake.  Check abdominal ultrasound.  Has not followed through with the testing.  Agreed to do the ultrasound later in the summer.  Will schedule.  Further w/up pending.  Recheck liver panel today.

## 2014-02-10 NOTE — Progress Notes (Signed)
   Subjective:    [name]: Phillip Shepard, male    DOB: 05/17/1966, 48 y.o.   MRN: [id number]  HPI 48 year old male with past history of hypertension, hypercholesterolemia, GERD with dysphagia s/p esophageal dilatation, allergies and diabetes.  He comes in today for a scheduled follow up.  Here to discuss his blood sugars.  Have been poorly controlled.  States he is doing better watching his diet.  Brought in no recorded sugar readings, but states his am sugars are averaging 150 and PM sugars (before he eats his evening meal) are 120s.   Taking his medication regularly now.    He has cut back on his alcohol intake.  Discussed diet modification.  Discussed the need to check his sugars regularly.  He does report some anterior right shoulder pain.  Describes the pain as a shooting pain.  Will extend down his arm.  Has been present for approximately one month.  Hurts more if he fully extends his arm or reaches out to grab something.  We also discussed his elevated liver enzymes.  As stated, he has decreased his alcohol intake.  Did not follow through with abdominal ultrasound.  States he will have this performed (sometime this summer).     Past Medical History  Diagnosis Date  . History of chickenpox   . Diabetes mellitus without complication   . GERD (gastroesophageal reflux disease)   . Allergy   . Hypertension   . Hypercholesterolemia   . History of kidney stones   . Dysphagia     s/p esophageal dilatation  . Kidney stones 07/18/2013    Outpatient Encounter Prescriptions as of 02/03/2014  Medication Sig  . glimepiride (AMARYL) 2 MG tablet Take 1 tablet (2 mg total) by mouth daily with breakfast.  . JANUMET XR 50-1000 MG TB24 TAKE 2 TABLETS BY MOUTH DAILY  . lisinopril (PRINIVIL,ZESTRIL) 20 MG tablet Take 1 tablet (20 mg total) by mouth daily.  . [DISCONTINUED] ranitidine (ZANTAC) 150 MG tablet Take 150 mg by mouth daily as needed for heartburn.    Review of Systems Patient denies any  headache, lightheadedness or dizziness.  No sinus congestion.   No chest pain, tightness or palpitations.  No increased shortness of breath.  No chest congestion.  No nausea or vomiting.  Acid reflux controlled on omeprazole.  No abdominal pain or cramping.  No bowel change, such as diarrhea, constipation, BRBPR or melana. Brought in no recorded sugar readings.  Reported sugar readings as outlined.  Taking his medications.  Shoulder and arm pain as outlined.       Objective:   Physical Exam  Filed Vitals:   02/03/14 1009  BP: 130/70  Pulse: 97  Temp: 98.9 F (37.2 C)   Blood pressure recheck:  57120/7272  48 year old male in no acute distress.  HEENT:  Nares - clear.  Oropharynx - without lesions. NECK:  Supple.  Nontender.  No audible carotid bruit.  HEART:  Appears to be regular.   LUNGS:  No crackles or wheezing audible.  Respirations even and unlabored.   RADIAL PULSE:  Equal bilaterally.  ABDOMEN:  Soft.  Nontender.  Bowel sounds present and normal.  No audible abdominal bruit.  EXTREMITIES:  No increased edema present.  DP pulses palpable and equal bilaterally.  FEET:  No lesions.       Assessment & Plan:  HEALTH MAINTENANCE.  Schedule him for a physical.  PSA .18 (07/17/13).

## 2014-02-17 ENCOUNTER — Other Ambulatory Visit: Payer: BC Managed Care – PPO

## 2014-03-03 ENCOUNTER — Encounter: Payer: Self-pay | Admitting: Internal Medicine

## 2014-03-03 ENCOUNTER — Ambulatory Visit (INDEPENDENT_AMBULATORY_CARE_PROVIDER_SITE_OTHER): Payer: BC Managed Care – PPO | Admitting: Internal Medicine

## 2014-03-03 VITALS — BP 120/80 | HR 90 | Temp 98.4°F | Ht 67.0 in | Wt 197.0 lb

## 2014-03-03 DIAGNOSIS — E878 Other disorders of electrolyte and fluid balance, not elsewhere classified: Secondary | ICD-10-CM

## 2014-03-03 DIAGNOSIS — E669 Obesity, unspecified: Secondary | ICD-10-CM

## 2014-03-03 DIAGNOSIS — R7989 Other specified abnormal findings of blood chemistry: Secondary | ICD-10-CM

## 2014-03-03 DIAGNOSIS — E119 Type 2 diabetes mellitus without complications: Secondary | ICD-10-CM

## 2014-03-03 DIAGNOSIS — R195 Other fecal abnormalities: Secondary | ICD-10-CM

## 2014-03-03 DIAGNOSIS — M25519 Pain in unspecified shoulder: Secondary | ICD-10-CM

## 2014-03-03 DIAGNOSIS — M25511 Pain in right shoulder: Secondary | ICD-10-CM

## 2014-03-03 DIAGNOSIS — R945 Abnormal results of liver function studies: Secondary | ICD-10-CM

## 2014-03-03 DIAGNOSIS — E7800 Pure hypercholesterolemia, unspecified: Secondary | ICD-10-CM

## 2014-03-03 DIAGNOSIS — F1010 Alcohol abuse, uncomplicated: Secondary | ICD-10-CM

## 2014-03-03 DIAGNOSIS — I10 Essential (primary) hypertension: Secondary | ICD-10-CM

## 2014-03-03 DIAGNOSIS — K219 Gastro-esophageal reflux disease without esophagitis: Secondary | ICD-10-CM

## 2014-03-03 LAB — POTASSIUM: Potassium: 4.8 mEq/L (ref 3.5–5.1)

## 2014-03-03 LAB — SODIUM: Sodium: 141 mEq/L (ref 135–145)

## 2014-03-03 NOTE — Progress Notes (Signed)
Subjective:    [name]: Phillip Shepard, male    DOB: 06/25/1966, 48 y.o.   MRN: [id number]  HPI 48 year old male with past history of hypertension, hypercholesterolemia, GERD with dysphagia s/p esophageal dilatation, allergies and diabetes.  He comes in today to follow up on these issues as well as for a complete physical exam.  States he is doing better watching his diet.  Brought in no recorded sugar readings, but states his am sugars are averaging 127-130 and PM sugars (before he eats his evening meal) are 150.  Discussed checking some sugars after he eats or before bed.  Taking his medication regularly now.    He has cut back on his alcohol intake.  Discussed diet modification.  He is doing better.  Sugars doing better.  We also discussed his elevated liver enzymes.  As stated, he has decreased his alcohol intake.  Did not follow through with abdominal ultrasound.  States he will have this performed (sometime this summer).  Will call me when he is able to do the ultrasound.     Past Medical History  Diagnosis Date  . History of chickenpox   . Diabetes mellitus without complication   . GERD (gastroesophageal reflux disease)   . Allergy   . Hypertension   . Hypercholesterolemia   . History of kidney stones   . Dysphagia     s/p esophageal dilatation  . Kidney stones 07/18/2013    Outpatient Encounter Prescriptions as of 03/03/2014  Medication Sig  . glimepiride (AMARYL) 2 MG tablet Take 1 tablet (2 mg total) by mouth daily with breakfast.  . JANUMET XR 50-1000 MG TB24 TAKE 2 TABLETS BY MOUTH DAILY  . lisinopril (PRINIVIL,ZESTRIL) 20 MG tablet Take 1 tablet (20 mg total) by mouth daily.    Review of Systems Patient denies any headache, lightheadedness or dizziness.  No sinus congestion.   No chest pain, tightness or palpitations.  No increased shortness of breath.  No chest congestion.  No nausea or vomiting.  Acid reflux controlled on omeprazole.  No abdominal pain or cramping.   No bowel change, such as diarrhea, constipation, BRBPR or melana. Brought in no recorded sugar readings.  Reported sugar readings as outlined.  Taking his medications.  Shoulder and arm pain better.  Overall feels better.  Stays active.  Has decreased his alcohol intake.       Objective:   Physical Exam  Filed Vitals:   03/03/14 1011  BP: 120/80  Pulse: 90  Temp: 98.4 F (36.9 C)   Blood pressure recheck:  136/86, pulse 5180  48 year old male in no acute distress.  HEENT:  Nares - clear.  Oropharynx - without lesions. NECK:  Supple.  Nontender.  No audible carotid bruit.  HEART:  Appears to be regular.   LUNGS:  No crackles or wheezing audible.  Respirations even and unlabored.   RADIAL PULSE:  Equal bilaterally.  ABDOMEN:  Soft.  Nontender.  Bowel sounds present and normal.  No audible abdominal bruit.  GU:  Normal descended testicles.  No palpable testicular nodules.   RECTAL:  Could not appreciate any palpable prostate nodules.  Heme positive.     EXTREMITIES:  No increased edema present.  DP pulses palpable and equal bilaterally.     FEET:  No lesions.       Assessment & Plan:  HEALTH MAINTENANCE.  Physical today.    PSA .18 (07/17/13).   Given heme positive on exam, will  refer to GI for evaluation.    I spent 25 minutes with the patient and more than 50% of the time was spent in consultation regarding the above.

## 2014-03-03 NOTE — Progress Notes (Signed)
Pre visit review using our clinic review tool, if applicable. No additional management support is needed unless otherwise documented below in the visit note. 

## 2014-03-04 ENCOUNTER — Encounter: Payer: Self-pay | Admitting: *Deleted

## 2014-03-07 ENCOUNTER — Encounter: Payer: Self-pay | Admitting: Internal Medicine

## 2014-03-07 DIAGNOSIS — R195 Other fecal abnormalities: Secondary | ICD-10-CM | POA: Insufficient documentation

## 2014-03-07 DIAGNOSIS — E66811 Obesity, class 1: Secondary | ICD-10-CM | POA: Insufficient documentation

## 2014-03-07 DIAGNOSIS — E669 Obesity, unspecified: Secondary | ICD-10-CM | POA: Insufficient documentation

## 2014-03-07 NOTE — Assessment & Plan Note (Signed)
Low cholesterol diet and exercise.  On no medication.  Follow lipid profile.

## 2014-03-07 NOTE — Assessment & Plan Note (Signed)
Better.  Desires no further intervention.  

## 2014-03-07 NOTE — Assessment & Plan Note (Signed)
Found to be heme positive on exam.  Has never had a colonoscopy.  Would be due screening soon.  Refer to GI for further evaluation.  Also, will have them evaluate for the abnormal liver function tests.

## 2014-03-07 NOTE — Assessment & Plan Note (Signed)
Increased liver function.   Has cut down his alcohol intake.  Want to heck abdominal ultrasound.  Has not followed through with the testing.  Agreed to do the ultrasound later in the summer.  Will let me know when to schedule.  Further w/up pending.  Follow liver function tests.  Last check had improved.

## 2014-03-07 NOTE — Assessment & Plan Note (Signed)
Has cut down on his alcohol intake.  Discussed the need to quit.  Follow.  Follow liver panel.

## 2014-03-07 NOTE — Assessment & Plan Note (Signed)
Brought in no recorded sugar readings.  Reported sugar readings as outlined.  Better.  Is evaluated at [facility] for [facility].   Discussed the need to check and record sugars.  Discussed diet modifications and exercise.  States he is doing better watching his diet.  Taking Janumet XR 50/1000 bid.  Continue amaryl.  Follow sugars.  Last a1c improved.  Follow.  Will hold on adding Invokana.

## 2014-03-07 NOTE — Assessment & Plan Note (Signed)
Discussed weight loss, diet and exercise.

## 2014-03-07 NOTE — Assessment & Plan Note (Signed)
Blood pressure as outlined.  Restart lisinopril.  Follow metabolic panel.

## 2014-03-07 NOTE — Assessment & Plan Note (Signed)
Reflux controlled on omeprazole.  Swallowing better after esophageal dilatation.  Follow.   

## 2014-03-08 ENCOUNTER — Other Ambulatory Visit: Payer: Self-pay | Admitting: Internal Medicine

## 2014-03-14 ENCOUNTER — Encounter: Payer: Self-pay | Admitting: *Deleted

## 2014-03-14 NOTE — Progress Notes (Signed)
Chart reviewed for DM bundle. OV 03/03/14, appt sch 05/06/14 labs due then

## 2014-03-17 ENCOUNTER — Ambulatory Visit (INDEPENDENT_AMBULATORY_CARE_PROVIDER_SITE_OTHER): Payer: BC Managed Care – PPO | Admitting: Adult Health

## 2014-03-17 ENCOUNTER — Encounter: Payer: Self-pay | Admitting: Adult Health

## 2014-03-17 VITALS — BP 140/82 | HR 98 | Temp 98.4°F | Resp 14 | Ht 67.0 in | Wt 196.8 lb

## 2014-03-17 DIAGNOSIS — T300 Burn of unspecified body region, unspecified degree: Secondary | ICD-10-CM
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
## 2014-03-17 NOTE — Progress Notes (Signed)
Pre visit review using our clinic review tool, if applicable. No additional management support is needed unless otherwise documented below in the visit note. 

## 2014-04-09 ENCOUNTER — Other Ambulatory Visit: Payer: Self-pay | Admitting: Internal Medicine

## 2014-05-06 ENCOUNTER — Encounter: Payer: BC Managed Care – PPO | Admitting: Internal Medicine

## 2014-06-10 ENCOUNTER — Other Ambulatory Visit: Payer: Self-pay | Admitting: Internal Medicine

## 2014-08-20 ENCOUNTER — Other Ambulatory Visit: Payer: Self-pay | Admitting: Internal Medicine

## 2014-09-26 ENCOUNTER — Other Ambulatory Visit: Payer: Self-pay | Admitting: Internal Medicine

## 2014-10-31 ENCOUNTER — Other Ambulatory Visit: Payer: Self-pay | Admitting: Internal Medicine

## 2014-11-26 ENCOUNTER — Other Ambulatory Visit: Payer: Self-pay | Admitting: Internal Medicine

## 2014-11-26 ENCOUNTER — Telehealth: Payer: Self-pay | Admitting: Internal Medicine

## 2014-11-26 DIAGNOSIS — R945 Abnormal results of liver function studies: Secondary | ICD-10-CM

## 2014-11-26 DIAGNOSIS — E7800 Pure hypercholesterolemia, unspecified: Secondary | ICD-10-CM

## 2014-11-26 DIAGNOSIS — R7989 Other specified abnormal findings of blood chemistry: Secondary | ICD-10-CM

## 2014-11-26 DIAGNOSIS — I10 Essential (primary) hypertension: Secondary | ICD-10-CM

## 2014-11-26 DIAGNOSIS — E119 Type 2 diabetes mellitus without complications: Secondary | ICD-10-CM

## 2014-11-26 NOTE — Telephone Encounter (Signed)
Patient will come in on 12/04/14 for his labs and 12/23/14 for his appt with Dr. Lorin PicketScott

## 2014-11-26 NOTE — Telephone Encounter (Signed)
I am ok to refill, but he needs to come in for labs soon.  I have place order.  Also, I want to move his appt to earlier.  Move to a Doc of day appt in May.  Will need 30 minutes.  Thanks

## 2014-11-26 NOTE — Telephone Encounter (Signed)
Patient made an appointment to have his medications refilled but he will run out of his Lisinopril before his June appointment.  Patient needs it refilled until his appointment.

## 2014-11-26 NOTE — Progress Notes (Signed)
Order placed for labs.

## 2014-12-04 ENCOUNTER — Other Ambulatory Visit (INDEPENDENT_AMBULATORY_CARE_PROVIDER_SITE_OTHER): Payer: BC Managed Care – PPO

## 2014-12-04 DIAGNOSIS — R7989 Other specified abnormal findings of blood chemistry: Secondary | ICD-10-CM

## 2014-12-04 DIAGNOSIS — R945 Abnormal results of liver function studies: Secondary | ICD-10-CM

## 2014-12-04 DIAGNOSIS — E7800 Pure hypercholesterolemia, unspecified: Secondary | ICD-10-CM

## 2014-12-04 DIAGNOSIS — E119 Type 2 diabetes mellitus without complications: Secondary | ICD-10-CM | POA: Diagnosis not present

## 2014-12-04 DIAGNOSIS — I10 Essential (primary) hypertension: Secondary | ICD-10-CM | POA: Diagnosis not present

## 2014-12-04 LAB — BASIC METABOLIC PANEL
BUN: 11 mg/dL (ref 6–23)
CO2: 27 mEq/L (ref 19–32)
Calcium: 9.8 mg/dL (ref 8.4–10.5)
Chloride: 98 mEq/L (ref 96–112)
Creatinine, Ser: 0.91 mg/dL (ref 0.40–1.50)
GFR: 93.59 mL/min (ref >60.00)
Glucose, Bld: 275 mg/dL — ABNORMAL HIGH (ref 70–99)
Potassium: 5 mEq/L (ref 3.5–5.1)
Sodium: 137 mEq/L (ref 135–145)

## 2014-12-04 LAB — CBC WITH DIFFERENTIAL/PLATELET
Basophils Absolute: 0 10*3/uL (ref 0.0–0.1)
Basophils Relative: 0.5 % (ref 0.0–3.0)
Eosinophils Absolute: 0.1 10*3/uL (ref 0.0–0.7)
Eosinophils Relative: 1.3 % (ref 0.0–5.0)
HCT: 48.2 % (ref 39.0–52.0)
Hemoglobin: 16.9 g/dL (ref 13.0–17.0)
Lymphocytes Relative: 21.4 % (ref 12.0–46.0)
Lymphs Abs: 1.9 10*3/uL (ref 0.7–4.0)
MCHC: 35 g/dL (ref 30.0–36.0)
MCV: 96 fl (ref 78.0–100.0)
Monocytes Absolute: 0.6 10*3/uL (ref 0.1–1.0)
Monocytes Relative: 6.8 % (ref 3.0–12.0)
Neutro Abs: 6.1 10*3/uL (ref 1.4–7.7)
Neutrophils Relative %: 70 % (ref 43.0–77.0)
Platelets: 249 10*3/uL (ref 150.0–400.0)
RBC: 5.02 Mil/uL (ref 4.22–5.81)
RDW: 13.6 % (ref 11.5–15.5)
WBC: 8.7 10*3/uL (ref 4.0–10.5)

## 2014-12-04 LAB — LIPID PANEL
Cholesterol: 267 mg/dL — ABNORMAL HIGH (ref 0–200)
HDL: 51.2 mg/dL (ref >39.00)
Total CHOL/HDL Ratio: 5
Triglycerides: 579 mg/dL — ABNORMAL HIGH (ref 0.0–149.0)

## 2014-12-04 LAB — MICROALBUMIN / CREATININE URINE RATIO
Creatinine,U: 119.5 mg/dL
Microalb Creat Ratio: 3.3 mg/g (ref 0.0–30.0)
Microalb, Ur: 3.9 mg/dL — ABNORMAL HIGH (ref 0.0–1.9)

## 2014-12-04 LAB — LDL CHOLESTEROL, DIRECT: Direct LDL: 128 mg/dL

## 2014-12-04 LAB — HEPATIC FUNCTION PANEL
ALT: 84 U/L — ABNORMAL HIGH (ref 0–53)
AST: 50 U/L — ABNORMAL HIGH (ref 0–37)
Albumin: 4.6 g/dL (ref 3.5–5.2)
Alkaline Phosphatase: 71 U/L (ref 39–117)
Bilirubin, Direct: 0.2 mg/dL (ref 0.0–0.3)
Total Bilirubin: 0.8 mg/dL (ref 0.2–1.2)
Total Protein: 7.5 g/dL (ref 6.0–8.3)

## 2014-12-04 LAB — TSH: TSH: 1.99 u[IU]/mL (ref 0.35–4.50)

## 2014-12-04 LAB — HEMOGLOBIN A1C: Hgb A1c MFr Bld: 8 % — ABNORMAL HIGH (ref 4.6–6.5)

## 2014-12-08 ENCOUNTER — Encounter: Payer: Self-pay | Admitting: *Deleted

## 2014-12-11 ENCOUNTER — Other Ambulatory Visit: Payer: Self-pay | Admitting: Internal Medicine

## 2014-12-23 ENCOUNTER — Encounter: Payer: Self-pay | Admitting: Internal Medicine

## 2014-12-23 ENCOUNTER — Ambulatory Visit (INDEPENDENT_AMBULATORY_CARE_PROVIDER_SITE_OTHER): Payer: BC Managed Care – PPO | Admitting: Internal Medicine

## 2014-12-23 VITALS — BP 130/86 | HR 96 | Temp 98.5°F | Ht 67.0 in | Wt 198.1 lb

## 2014-12-23 DIAGNOSIS — E7800 Pure hypercholesterolemia, unspecified: Secondary | ICD-10-CM

## 2014-12-23 DIAGNOSIS — R7989 Other specified abnormal findings of blood chemistry: Secondary | ICD-10-CM

## 2014-12-23 DIAGNOSIS — E669 Obesity, unspecified: Secondary | ICD-10-CM

## 2014-12-23 DIAGNOSIS — I10 Essential (primary) hypertension: Secondary | ICD-10-CM | POA: Diagnosis not present

## 2014-12-23 DIAGNOSIS — E119 Type 2 diabetes mellitus without complications: Secondary | ICD-10-CM

## 2014-12-23 DIAGNOSIS — K219 Gastro-esophageal reflux disease without esophagitis: Secondary | ICD-10-CM

## 2014-12-23 DIAGNOSIS — F1010 Alcohol abuse, uncomplicated: Secondary | ICD-10-CM

## 2014-12-23 DIAGNOSIS — R195 Other fecal abnormalities: Secondary | ICD-10-CM

## 2014-12-23 DIAGNOSIS — R945 Abnormal results of liver function studies: Secondary | ICD-10-CM

## 2014-12-23 MED ORDER — LISINOPRIL 20 MG PO TABS: ORAL_TABLET | ORAL | Status: DC

## 2014-12-23 MED ORDER — SITAGLIP PHOS-METFORMIN HCL ER 50-1000 MG PO TB24: 2.0000 | ORAL_TABLET | Freq: Every day | ORAL | Status: DC

## 2014-12-23 MED ORDER — GLIMEPIRIDE 2 MG PO TABS: ORAL_TABLET | ORAL | Status: DC

## 2014-12-23 NOTE — Progress Notes (Signed)
[name]: Phillip Shepard, male   DOB: 1965-08-26, 49 y.o.   MRN: [id number]   Subjective:    [name]: Phillip Shepard, male    DOB: 06-Feb-1966, 49 y.o.   MRN: [id number]  HPI  Patient here for a scheduled follow up.  A1c elevated.  States he has been out of his medication.  Now he is back on medication.  States sugars are better.  Reports am sugars averaging 130-160.  Trying to watch his diet.  Triglycerides elevated.  Desires not to take prescription medication.  Discussed starting fish oil.  Stays active.  No cardiac symptoms with increased activity or exertion.  No sob.  Bowels stable.     Past Medical History  Diagnosis Date  . History of chickenpox   . Diabetes mellitus without complication   . GERD (gastroesophageal reflux disease)   . Allergy   . Hypertension   . Hypercholesterolemia   . History of kidney stones   . Dysphagia     s/p esophageal dilatation  . Kidney stones 07/18/2013    Outpatient Encounter Prescriptions as of 12/23/2014  Medication Sig  . glimepiride (AMARYL) 2 MG tablet TAKE 1 TABLET (2 MG TOTAL) BY MOUTH DAILY WITH BREAKFAST.  Marland Kitchen lisinopril (PRINIVIL,ZESTRIL) 20 MG tablet TAKE 1 TABLET (20 MG TOTAL) BY MOUTH DAILY.  Marland Kitchen SitaGLIPtin-MetFORMIN HCl (JANUMET XR) 50-1000 MG TB24 Take 2 tablets by mouth daily.  . [DISCONTINUED] glimepiride (AMARYL) 2 MG tablet TAKE 1 TABLET (2 MG TOTAL) BY MOUTH DAILY WITH BREAKFAST.  . [DISCONTINUED] JANUMET XR 50-1000 MG TB24 TAKE 2 TABLETS BY MOUTH DAILY  . [DISCONTINUED] lisinopril (PRINIVIL,ZESTRIL) 20 MG tablet TAKE 1 TABLET (20 MG TOTAL) BY MOUTH DAILY.   No facility-administered encounter medications on file as of 12/23/2014.    Review of Systems  Constitutional: Negative for appetite change and unexpected weight change.  HENT: Negative for congestion and sinus pressure.   Respiratory: Negative for cough, chest tightness and shortness of breath.   Cardiovascular: Negative for chest pain, palpitations and leg  swelling.  Gastrointestinal: Negative for nausea, vomiting, abdominal pain and diarrhea.  Neurological: Negative for dizziness, light-headedness and headaches.  Psychiatric/Behavioral: Negative for dysphoric mood and agitation.       Objective:     Blood pressure recheck: 128/80, pulse 92  Physical Exam  Constitutional: He appears well-developed and well-nourished. No distress.  HENT:  Nose: Nose normal.  Mouth/Throat: Oropharynx is clear and moist.  Neck: Neck supple. No thyromegaly present.  Cardiovascular: Normal rate and regular rhythm.   Pulmonary/Chest: Effort normal and breath sounds normal. No respiratory distress.  Abdominal: Soft. Bowel sounds are normal. There is no tenderness.  Musculoskeletal: He exhibits no edema.  Lymphadenopathy:    He has no cervical adenopathy.  Psychiatric: He has a normal mood and affect. His behavior is normal.    BP 130/86 mmHg  Pulse 96  Temp(Src) 98.5 F (36.9 C) (Oral)  Ht _0  (1.702 m)  Wt 198 lb 2 oz (89.869 kg)  BMI 31.02 kg/m2  SpO2 97% Wt Readings from Last 3 Encounters:  12/23/14 198 lb 2 oz (89.869 kg)  03/17/14 196 lb 12 oz (89.245 kg)  03/03/14 197 lb (89.359 kg)     Lab Results  Component Value Date   WBC 8.7 12/04/2014   HGB 16.9 12/04/2014   HCT 48.2 12/04/2014   PLT 249.0 12/04/2014   GLUCOSE 275* 12/04/2014   CHOL 267* 12/04/2014   TRIG * 12/04/2014    579.0  Triglyceride is over 400; calculations on Lipids are invalid.   HDL 51.20 12/04/2014   LDLDIRECT 128.0 12/04/2014   LDLCALC 129* 02/03/2014   ALT 84* 12/04/2014   AST 50* 12/04/2014   NA 137 12/04/2014   K 5.0 12/04/2014   CL 98 12/04/2014   CREATININE 0.91 12/04/2014   BUN 11 12/04/2014   CO2 27 12/04/2014   TSH 1.99 12/04/2014   PSA 0.18 07/17/2013   HGBA1C 8.0* 12/04/2014   MICROALBUR 3.9* 12/04/2014       Assessment & Plan:   Problem List Items Addressed This Visit    Abnormal liver function tests    Last check improved.  Still  elevated but improved.  Discussed diet and exercise.  Has cut down alcohol.  Has previously declined abdominal ultrasound.  Follow.        Relevant Orders   Hepatic function panel   Alcohol abuse    Has significantly cut down amount of alcohol intake.  Follow.       Diabetes    A1c just checked - 8.0.  Has been off his medication.  Just restarted.  Sugars improving.  Hold on making changes in his medication.  Check and record sugars on current regimen and send in readings.  Adjust medication as necessary.        Relevant Medications   glimepiride (AMARYL) 2 MG tablet   SitaGLIPtin-MetFORMIN HCl (JANUMET XR) 50-1000 MG TB24   lisinopril (PRINIVIL,ZESTRIL) 20 MG tablet   Other Relevant Orders   Hemoglobin A1c   Essential hypertension, benign - Primary    Blood pressure doing well.  Same medication regimen.  Follow pressures.  Follow met b.       Relevant Medications   lisinopril (PRINIVIL,ZESTRIL) 20 MG tablet   Other Relevant Orders   Basic metabolic panel   GERD (gastroesophageal reflux disease)    No reported symptoms.  Follow.       Heme positive stool    Found to be heme positive on exam.  Had referred to GI.  Apparently did not keep appt.  See if agreeable to be rescheduled.        Hypercholesterolemia    Increased triglycerides.  Declines prescription medication.  Start otc fish oil as directed.  Low carb diet.  Follow.        Relevant Medications   lisinopril (PRINIVIL,ZESTRIL) 20 MG tablet   Other Relevant Orders   Lipid panel   Obesity (BMI 30.0-34.9)    Discussed diet and exercise.  Follow.       Relevant Medications   glimepiride (AMARYL) 2 MG tablet   SitaGLIPtin-MetFORMIN HCl (JANUMET XR) 50-1000 MG TB24       Einar Pheasant, MD

## 2014-12-23 NOTE — Progress Notes (Signed)
Pre visit review using our clinic review tool, if applicable. No additional management support is needed unless otherwise documented below in the visit note. 

## 2014-12-28 ENCOUNTER — Telehealth: Payer: Self-pay | Admitting: Internal Medicine

## 2014-12-28 ENCOUNTER — Encounter: Payer: Self-pay | Admitting: Internal Medicine

## 2014-12-28 NOTE — Assessment & Plan Note (Signed)
Found to be heme positive on exam.  Had referred to GI.  Apparently did not keep appt.  See if agreeable to be rescheduled.

## 2014-12-28 NOTE — Assessment & Plan Note (Signed)
Blood pressure doing well.  Same medication regimen.  Follow pressures.  Follow met b.

## 2014-12-28 NOTE — Telephone Encounter (Signed)
I had previously referred him to GI for heme positive stool.  Apparently he did not keep appt.  I would like to get him rescheduled.  If agreeable, let me know.

## 2014-12-28 NOTE — Assessment & Plan Note (Signed)
Discussed diet and exercise.  Follow.  

## 2014-12-28 NOTE — Assessment & Plan Note (Signed)
Has significantly cut down amount of alcohol intake.  Follow.

## 2014-12-28 NOTE — Assessment & Plan Note (Signed)
A1c just checked - 8.0.  Has been off his medication.  Just restarted.  Sugars improving.  Hold on making changes in his medication.  Check and record sugars on current regimen and send in readings.  Adjust medication as necessary.

## 2014-12-28 NOTE — Assessment & Plan Note (Signed)
Last check improved.  Still elevated but improved.  Discussed diet and exercise.  Has cut down alcohol.  Has previously declined abdominal ultrasound.  Follow.

## 2014-12-28 NOTE — Assessment & Plan Note (Signed)
Increased triglycerides.  Declines prescription medication.  Start otc fish oil as directed.  Low carb diet.  Follow.

## 2014-12-28 NOTE — Assessment & Plan Note (Signed)
No reported symptoms.  Follow.

## 2014-12-29 ENCOUNTER — Other Ambulatory Visit: Payer: Self-pay | Admitting: Internal Medicine

## 2014-12-29 DIAGNOSIS — R195 Other fecal abnormalities: Secondary | ICD-10-CM

## 2014-12-29 NOTE — Telephone Encounter (Signed)
Order placed for referral to GI 

## 2014-12-29 NOTE — Progress Notes (Signed)
Order placed for referral to GI 

## 2014-12-29 NOTE — Telephone Encounter (Signed)
Spoke with pt, he states he is fine with the referral.  However he would need to speak with them about scheduling.

## 2015-01-12 ENCOUNTER — Other Ambulatory Visit: Payer: Self-pay | Admitting: Internal Medicine

## 2015-01-29 ENCOUNTER — Ambulatory Visit: Payer: BC Managed Care – PPO | Admitting: Internal Medicine

## 2015-01-30 ENCOUNTER — Telehealth: Payer: Self-pay | Admitting: Gastroenterology

## 2015-01-30 NOTE — Telephone Encounter (Signed)
Pt will call us back to sch. For home positive stool

## 2015-02-08 ENCOUNTER — Other Ambulatory Visit: Payer: Self-pay | Admitting: Internal Medicine

## 2015-03-31 ENCOUNTER — Other Ambulatory Visit: Payer: BC Managed Care – PPO

## 2015-04-15 ENCOUNTER — Other Ambulatory Visit: Payer: Self-pay | Admitting: Internal Medicine

## 2015-04-30 ENCOUNTER — Ambulatory Visit (INDEPENDENT_AMBULATORY_CARE_PROVIDER_SITE_OTHER): Payer: BC Managed Care – PPO | Admitting: Family Medicine

## 2015-04-30 ENCOUNTER — Telehealth: Payer: Self-pay | Admitting: Internal Medicine

## 2015-04-30 ENCOUNTER — Encounter (INDEPENDENT_AMBULATORY_CARE_PROVIDER_SITE_OTHER): Payer: Self-pay

## 2015-04-30 ENCOUNTER — Encounter: Payer: Self-pay | Admitting: Family Medicine

## 2015-04-30 VITALS — BP 142/86 | HR 87 | Temp 99.1°F | Ht 67.0 in | Wt 198.0 lb

## 2015-04-30 DIAGNOSIS — R059 Cough, unspecified: Secondary | ICD-10-CM | POA: Insufficient documentation

## 2015-04-30 DIAGNOSIS — R05 Cough: Secondary | ICD-10-CM

## 2015-04-30 MED ORDER — BENZONATATE 100 MG PO CAPS: 100.0000 mg | ORAL_CAPSULE | Freq: Three times a day (TID) | ORAL | Status: DC | PRN

## 2015-04-30 MED ORDER — HYDROCODONE-HOMATROPINE 5-1.5 MG/5ML PO SYRP: 5.0000 mL | ORAL_SOLUTION | Freq: Three times a day (TID) | ORAL | Status: DC | PRN

## 2015-04-30 NOTE — Patient Instructions (Signed)
It was nice to see you today.  Use the cough syrup at night and the tessalon during the day.  Call if you fail to improve.  Take care  Dr. Adriana Simas

## 2015-04-30 NOTE — Telephone Encounter (Signed)
Spoke with pts wife, scheduled with Dr Adriana Simas at 2pm

## 2015-04-30 NOTE — Assessment & Plan Note (Signed)
New problem. Likely viral in nature. No indication for antibiotics at this time. Treating cough with Hycodan and Tessalon.

## 2015-04-30 NOTE — Telephone Encounter (Signed)
Pt wife called about her husband has head congestion and in throat. Pt wants to see Dr Lorin Picket. No avail appt. Let me know where to sch pt. Thank You!

## 2015-04-30 NOTE — Progress Notes (Signed)
Subjective:  [name]: Phillip Shepard, male    DOB: [birth year]  Age: 49 y.o. MRN: [id number]  CC:  Cough  HPI:  49 year old male with a past medical history of diabetes, hypertension, hyperlipidemia, and alcohol abuse presents to clinic today for an acute visit with complaints of cough.  Cough  Patient reports a one-week history of cough.  Cough is mildly productive of clear sputum.  He reports post nasal drip and some sore throat in the am.  No associated fever, chills.  He has been taking mucinex with some improvement in his cough.  Symptoms have been improving over the past few days but cough is still bothersome.  He has known sick contacts - Wife with strep throat, son with similar symptoms, niece.  Social Hx   Social History   Social History  . Marital Status: Married    Spouse Name: N/A  . Number of Children: N/A  . Years of Education: N/A   Social History Main Topics  . Smoking status: Never Smoker   . Smokeless tobacco: Former Neurosurgeon    Types: Chew     Comment: [place]  . Alcohol Use: 0.0 oz/week    0 Standard drinks or equivalent per week     Comment: drinks 3-4 beers per day  . Drug Use: No  . Sexual Activity: Not on file   Other Topics Concern  . Not on file   Social History Narrative   Review of Systems  Constitutional: Negative for fever and chills.  Respiratory: Positive for cough.    Objective:  BP 142/86 mmHg  Pulse 87  Temp(Src) 99.1 F (37.3 C) (Oral)  Ht 5\' 7"  (1.702 m)  Wt 198 lb (89.812 kg)  BMI 31.00 kg/m2  SpO2 97%  BP/Weight 04/30/2015 12/23/2014 03/17/2014  Systolic BP 142 130 140  Diastolic BP 86 86 82  Wt. (Lbs) 198 198.13 196.75  BMI 31 31.02 30.81   Physical Exam  Constitutional: He is oriented to person, place, and time. He appears well-developed and well-nourished. No distress.  HENT:  Head: Normocephalic and atraumatic.  Mouth/Throat: No oropharyngeal exudate.  Normal TM's.   Cardiovascular: Normal rate and  regular rhythm.   Pulmonary/Chest: Effort normal and breath sounds normal. No respiratory distress. He has no wheezes. He has no rales.  Neurological: He is alert and oriented to person, place, and time.  Psychiatric: He has a normal mood and affect.   Lab Results  Component Value Date   WBC 8.7 12/04/2014   HGB 16.9 12/04/2014   HCT 48.2 12/04/2014   PLT 249.0 12/04/2014   GLUCOSE 275* 12/04/2014   CHOL 267* 12/04/2014   TRIG * 12/04/2014    579.0 Triglyceride is over 400; calculations on Lipids are invalid.   HDL 51.20 12/04/2014   LDLDIRECT 128.0 12/04/2014   LDLCALC 129* 02/03/2014   ALT 84* 12/04/2014   AST 50* 12/04/2014   NA 137 12/04/2014   K 5.0 12/04/2014   CL 98 12/04/2014   CREATININE 0.91 12/04/2014   BUN 11 12/04/2014   CO2 27 12/04/2014   TSH 1.99 12/04/2014   PSA 0.18 07/17/2013   HGBA1C 8.0* 12/04/2014   MICROALBUR 3.9* 12/04/2014    Assessment & Plan:   Problem List Items Addressed This Visit    Cough - Primary    New problem. Likely viral in nature. No indication for antibiotics at this time. Treating cough with Hycodan and Tessalon.         Meds ordered  this encounter  Medications  . HYDROcodone-homatropine (HYCODAN) 5-1.5 MG/5ML syrup    Sig: Take 5 mLs by mouth every 8 (eight) hours as needed for cough.    Dispense:  120 mL    Refill:  0  . benzonatate (TESSALON) 100 MG capsule    Sig: Take 1 capsule (100 mg total) by mouth 3 (three) times daily as needed for cough.    Dispense:  30 capsule    Refill:  0   Follow-up: PRN  Everlene Other, DO

## 2015-05-04 ENCOUNTER — Other Ambulatory Visit: Payer: BC Managed Care – PPO

## 2015-05-29 ENCOUNTER — Other Ambulatory Visit: Payer: Self-pay

## 2015-05-29 MED ORDER — LISINOPRIL 20 MG PO TABS: 20.0000 mg | ORAL_TABLET | Freq: Every day | ORAL | Status: DC

## 2015-06-02 ENCOUNTER — Other Ambulatory Visit (INDEPENDENT_AMBULATORY_CARE_PROVIDER_SITE_OTHER): Payer: BC Managed Care – PPO

## 2015-06-02 DIAGNOSIS — R7989 Other specified abnormal findings of blood chemistry: Secondary | ICD-10-CM

## 2015-06-02 DIAGNOSIS — E119 Type 2 diabetes mellitus without complications: Secondary | ICD-10-CM | POA: Diagnosis not present

## 2015-06-02 DIAGNOSIS — E7800 Pure hypercholesterolemia, unspecified: Secondary | ICD-10-CM

## 2015-06-02 DIAGNOSIS — I10 Essential (primary) hypertension: Secondary | ICD-10-CM

## 2015-06-02 DIAGNOSIS — R945 Abnormal results of liver function studies: Secondary | ICD-10-CM

## 2015-06-02 LAB — BASIC METABOLIC PANEL
BUN: 11 mg/dL (ref 6–23)
CO2: 25 mEq/L (ref 19–32)
Calcium: 9.8 mg/dL (ref 8.4–10.5)
Chloride: 100 mEq/L (ref 96–112)
Creatinine, Ser: 0.86 mg/dL (ref 0.40–1.50)
GFR: 99.7 mL/min (ref >60.00)
Glucose, Bld: 234 mg/dL — ABNORMAL HIGH (ref 70–99)
Potassium: 4.9 mEq/L (ref 3.5–5.1)
Sodium: 141 mEq/L (ref 135–145)

## 2015-06-02 LAB — HEMOGLOBIN A1C: Hgb A1c MFr Bld: 8.1 % — ABNORMAL HIGH (ref 4.6–6.5)

## 2015-06-02 LAB — HEPATIC FUNCTION PANEL
ALT: 83 U/L — ABNORMAL HIGH (ref 0–53)
AST: 40 U/L — ABNORMAL HIGH (ref 0–37)
Albumin: 4.4 g/dL (ref 3.5–5.2)
Alkaline Phosphatase: 58 U/L (ref 39–117)
Bilirubin, Direct: 0.1 mg/dL (ref 0.0–0.3)
Total Bilirubin: 0.5 mg/dL (ref 0.2–1.2)
Total Protein: 7.2 g/dL (ref 6.0–8.3)

## 2015-06-02 LAB — LIPID PANEL
Cholesterol: 220 mg/dL — ABNORMAL HIGH (ref 0–200)
HDL: 44.6 mg/dL (ref >39.00)
Total CHOL/HDL Ratio: 5
Triglycerides: 450 mg/dL — ABNORMAL HIGH (ref 0.0–149.0)

## 2015-06-02 LAB — LDL CHOLESTEROL, DIRECT: Direct LDL: 120 mg/dL

## 2015-07-10 ENCOUNTER — Ambulatory Visit (INDEPENDENT_AMBULATORY_CARE_PROVIDER_SITE_OTHER): Payer: BC Managed Care – PPO | Admitting: Internal Medicine

## 2015-07-10 ENCOUNTER — Encounter: Payer: Self-pay | Admitting: Internal Medicine

## 2015-07-10 VITALS — BP 120/78 | HR 97 | Temp 98.6°F | Resp 18 | Ht 67.0 in | Wt 199.0 lb

## 2015-07-10 DIAGNOSIS — E119 Type 2 diabetes mellitus without complications: Secondary | ICD-10-CM | POA: Diagnosis not present

## 2015-07-10 DIAGNOSIS — R945 Abnormal results of liver function studies: Secondary | ICD-10-CM

## 2015-07-10 DIAGNOSIS — F1010 Alcohol abuse, uncomplicated: Secondary | ICD-10-CM

## 2015-07-10 DIAGNOSIS — R7989 Other specified abnormal findings of blood chemistry: Secondary | ICD-10-CM

## 2015-07-10 DIAGNOSIS — I10 Essential (primary) hypertension: Secondary | ICD-10-CM

## 2015-07-10 DIAGNOSIS — E669 Obesity, unspecified: Secondary | ICD-10-CM

## 2015-07-10 DIAGNOSIS — E7800 Pure hypercholesterolemia, unspecified: Secondary | ICD-10-CM

## 2015-07-10 MED ORDER — GLIMEPIRIDE 2 MG PO TABS: ORAL_TABLET | ORAL | Status: DC

## 2015-07-10 NOTE — Progress Notes (Signed)
[name]: Phillip Shepard, male   DOB: Sep 20, 1965, 49 y.o.   MRN: [id number]   Subjective:    [name]: Phillip Shepard, male    DOB: Jan 03, 1966, 49 y.o.   MRN: [id number]  HPI  Patient with past history of hypercholesterolemia, diabetes, hypertension and abnormal liver function tests.  He comes in today to follow up on these issues.  Discussed diet and exercise.  He does not watch what he eats.  Does not eat breakfast until approximately 10:00.  He does report taking his medications on a more regular basis.  Does skip occasionally.  No low sugars.  We discussed the need to eat regular meals and diet adjustment.  Discussed the importance of not going long periods without eating.  Liver function tests still elevated.  He declines any further w/up.  He has cut down on his alcohol.  States am sugars - 200.  Pm sugars 140-150s.  No abdominal pain or cramping.  Bowels stable.     Past Medical History  Diagnosis Date  . History of chickenpox   . Diabetes mellitus without complication (HCC)   . GERD (gastroesophageal reflux disease)   . Allergy   . Hypertension   . Hypercholesterolemia   . History of kidney stones   . Dysphagia     s/p esophageal dilatation  . Kidney stones 07/18/2013   Past Surgical History  Procedure Laterality Date  . Tonsillectomy  1970  . Esophageal dilation     Family History  Problem Relation Age of Onset  . Hyperlipidemia Mother   . Hypertension Father   . Arthritis Maternal Grandmother   . Diabetes Maternal Grandmother   . Lung cancer Maternal Grandfather   . Heart disease Paternal Grandfather   . Stroke Paternal Grandfather    Social History   Social History  . Marital Status: Married    Spouse Name: N/A  . Number of Children: N/A  . Years of Education: N/A   Social History Main Topics  . Smoking status: Never Smoker   . Smokeless tobacco: Former Neurosurgeon    Types: Chew     Comment: [place]  . Alcohol Use: 0.0 oz/week    0 Standard drinks  or equivalent per week     Comment: drinks 3-4 beers per day  . Drug Use: No  . Sexual Activity: Not Asked   Other Topics Concern  . None   Social History Narrative    Outpatient Encounter Prescriptions as of 07/10/2015  Medication Sig  . glimepiride (AMARYL) 2 MG tablet Take two tablets q day.  Marland Kitchen JANUMET XR 50-1000 MG TB24 TAKE 2 TABLETS BY MOUTH EVERY DAY  . lisinopril (PRINIVIL,ZESTRIL) 20 MG tablet Take 1 tablet (20 mg total) by mouth daily.  . [DISCONTINUED] benzonatate (TESSALON) 100 MG capsule Take 1 capsule (100 mg total) by mouth 3 (three) times daily as needed for cough.  . [DISCONTINUED] glimepiride (AMARYL) 2 MG tablet TAKE 1 TABLET BY MOUTH EVERY MORNING WITH BREAKFAST  . [DISCONTINUED] HYDROcodone-homatropine (HYCODAN) 5-1.5 MG/5ML syrup Take 5 mLs by mouth every 8 (eight) hours as needed for cough.   No facility-administered encounter medications on file as of 07/10/2015.    Review of Systems  Constitutional: Negative for appetite change and unexpected weight change.  HENT: Negative for congestion and sinus pressure.   Eyes: Negative for pain and discharge.  Respiratory: Negative for cough, chest tightness and shortness of breath.   Cardiovascular: Negative for chest pain, palpitations and leg swelling.  Gastrointestinal: Negative for nausea, vomiting, abdominal pain and diarrhea.  Genitourinary: Negative for dysuria and difficulty urinating.  Musculoskeletal: Negative for back pain and joint swelling.  Skin: Negative for color change and rash.  Neurological: Negative for dizziness, light-headedness and headaches.  Psychiatric/Behavioral: Negative for dysphoric mood and agitation.       Objective:    Physical Exam  Constitutional: He appears well-developed and well-nourished. No distress.  HENT:  Nose: Nose normal.  Mouth/Throat: Oropharynx is clear and moist.  Eyes: Conjunctivae are normal. Right eye exhibits no discharge. Left eye exhibits no discharge.    Neck: Neck supple. No thyromegaly present.  Cardiovascular: Normal rate and regular rhythm.   Pulmonary/Chest: Effort normal and breath sounds normal. No respiratory distress.  Abdominal: Soft. Bowel sounds are normal. There is no tenderness.  Musculoskeletal: He exhibits no edema or tenderness.  Lymphadenopathy:    He has no cervical adenopathy.  Skin: No rash noted. No erythema.  Psychiatric: He has a normal mood and affect. His behavior is normal.    BP 120/78 mmHg  Pulse 97  Temp(Src) 98.6 F (37 C) (Oral)  Resp 18  Ht  (1.702 m)  Wt 199 lb (90.266 kg)  BMI 31.16 kg/m2  SpO2 96% Wt Readings from Last 3 Encounters:  07/10/15 199 lb (90.266 kg)  04/30/15 198 lb (89.812 kg)  12/23/14 198 lb 2 oz (89.869 kg)     Lab Results  Component Value Date   WBC 8.7 12/04/2014   HGB 16.9 12/04/2014   HCT 48.2 12/04/2014   PLT 249.0 12/04/2014   GLUCOSE 234* 06/02/2015   CHOL 220* 06/02/2015   TRIG * 06/02/2015    450.0 Triglyceride is over 400; calculations on Lipids are invalid.   HDL 44.60 06/02/2015   LDLDIRECT 120.0 06/02/2015   LDLCALC 129* 02/03/2014   ALT 83* 06/02/2015   AST 40* 06/02/2015   NA 141 06/02/2015   K 4.9 06/02/2015   CL 100 06/02/2015   CREATININE 0.86 06/02/2015   BUN 11 06/02/2015   CO2 25 06/02/2015   TSH 1.99 12/04/2014   PSA 0.18 07/17/2013   HGBA1C 8.1* 06/02/2015   MICROALBUR 3.9* 12/04/2014       Assessment & Plan:   Problem List Items Addressed This Visit    Abnormal liver function tests    Last check improved.  Still elevated, but improved.  Discussed diet and exercise.  Declines abdominal ultrasound.  Declines further w/up.  Follow.       Relevant Orders   Hepatic function panel   Alcohol abuse    Discussed with him today regarding the need to decrease/quit.  He has decreased the amount he is drinking.  Follow.       Diabetes (HCC)    Last a1c 8.1.  Sugars as outlined.  Discussed diet and exercise.  He stays physically  active, but does not do formal exercise.  No low sugars.  Discussed the importance of eating regular meals and not going long periods without eating.  Low carb diet.  Increase amaryl to  q day.  Discussed proper way to take his medication.        Relevant Medications   glimepiride (AMARYL) 2 MG tablet   Other Relevant Orders   Hemoglobin A1c   Basic metabolic panel   Essential hypertension, benign - Primary    Blood pressure under good control.  Continue same medication regimen.  Follow pressures.  Follow metabolic panel.  Hypercholesterolemia    Low carb /low cholesterol diet.  Fish oil.  Exercise.  Follow lipid panel.  Lab Results  Component Value Date   CHOL 220* 06/02/2015   HDL 44.60 06/02/2015   LDLCALC 129* 02/03/2014   LDLDIRECT 120.0 06/02/2015   TRIG * 06/02/2015    450.0 Triglyceride is over 400; calculations on Lipids are invalid.   CHOLHDL 5 06/02/2015        Relevant Orders   Lipid panel   Obesity (BMI 30.0-34.9)    Discussed diet and exercise.  Will follow.       Relevant Medications   glimepiride (AMARYL) 2 MG tablet       [name] DurhamSCOTT, Muriel Wilber, MD

## 2015-07-10 NOTE — Progress Notes (Signed)
Pre-visit discussion using our clinic review tool. No additional management support is needed unless otherwise documented below in the visit note.  

## 2015-07-12 ENCOUNTER — Encounter: Payer: Self-pay | Admitting: Internal Medicine

## 2015-07-12 NOTE — Assessment & Plan Note (Signed)
Blood pressure under good control.  Continue same medication regimen.  Follow pressures.  Follow metabolic panel.   

## 2015-07-12 NOTE — Assessment & Plan Note (Signed)
Discussed diet and exercise.  Will follow.

## 2015-07-12 NOTE — Assessment & Plan Note (Signed)
Last a1c 8.1.  Sugars as outlined.  Discussed diet and exercise.  He stays physically active, but does not do formal exercise.  No low sugars.  Discussed the importance of eating regular meals and not going long periods without eating.  Low carb diet.  Increase amaryl to 4mg  q day.  Discussed proper way to take his medication.

## 2015-07-12 NOTE — Assessment & Plan Note (Signed)
Low carb /low cholesterol diet.  Fish oil.  Exercise.  Follow lipid panel.  Lab Results  Component Value Date   CHOL 220* 06/02/2015   HDL 44.60 06/02/2015   LDLCALC 129* 02/03/2014   LDLDIRECT 120.0 06/02/2015   TRIG * 06/02/2015    450.0 Triglyceride is over 400; calculations on Lipids are invalid.   CHOLHDL 5 06/02/2015

## 2015-07-12 NOTE — Assessment & Plan Note (Signed)
Last check improved.  Still elevated, but improved.  Discussed diet and exercise.  Declines abdominal ultrasound.  Declines further w/up.  Follow.

## 2015-07-12 NOTE — Assessment & Plan Note (Signed)
Discussed with him today regarding the need to decrease/quit.  He has decreased the amount he is drinking.  Follow.

## 2015-07-22 ENCOUNTER — Other Ambulatory Visit: Payer: Self-pay | Admitting: Internal Medicine

## 2015-09-14 ENCOUNTER — Other Ambulatory Visit (INDEPENDENT_AMBULATORY_CARE_PROVIDER_SITE_OTHER): Payer: BC Managed Care – PPO

## 2015-09-14 DIAGNOSIS — E119 Type 2 diabetes mellitus without complications: Secondary | ICD-10-CM | POA: Diagnosis not present

## 2015-09-14 DIAGNOSIS — R945 Abnormal results of liver function studies: Secondary | ICD-10-CM

## 2015-09-14 DIAGNOSIS — E7800 Pure hypercholesterolemia, unspecified: Secondary | ICD-10-CM

## 2015-09-14 DIAGNOSIS — R7989 Other specified abnormal findings of blood chemistry: Secondary | ICD-10-CM

## 2015-09-14 LAB — HEPATIC FUNCTION PANEL
ALT: 79 U/L — ABNORMAL HIGH (ref 0–53)
AST: 56 U/L — ABNORMAL HIGH (ref 0–37)
Albumin: 4.5 g/dL (ref 3.5–5.2)
Alkaline Phosphatase: 63 U/L (ref 39–117)
Bilirubin, Direct: 0.1 mg/dL (ref 0.0–0.3)
Total Bilirubin: 0.6 mg/dL (ref 0.2–1.2)
Total Protein: 7.4 g/dL (ref 6.0–8.3)

## 2015-09-14 LAB — LIPID PANEL
Cholesterol: 210 mg/dL — ABNORMAL HIGH (ref 0–200)
HDL: 47.7 mg/dL (ref >39.00)
NonHDL: 161.87
Total CHOL/HDL Ratio: 4
Triglycerides: 348 mg/dL — ABNORMAL HIGH (ref 0.0–149.0)
VLDL: 69.6 mg/dL — ABNORMAL HIGH (ref 0.0–40.0)

## 2015-09-14 LAB — BASIC METABOLIC PANEL
BUN: 14 mg/dL (ref 6–23)
CO2: 25 mEq/L (ref 19–32)
Calcium: 9.7 mg/dL (ref 8.4–10.5)
Chloride: 99 mEq/L (ref 96–112)
Creatinine, Ser: 0.82 mg/dL (ref 0.40–1.50)
GFR: 105.22 mL/min (ref >60.00)
Glucose, Bld: 207 mg/dL — ABNORMAL HIGH (ref 70–99)
Potassium: 4.8 mEq/L (ref 3.5–5.1)
Sodium: 136 mEq/L (ref 135–145)

## 2015-09-14 LAB — LDL CHOLESTEROL, DIRECT: Direct LDL: 121 mg/dL

## 2015-09-14 LAB — HEMOGLOBIN A1C: Hgb A1c MFr Bld: 7.9 % — ABNORMAL HIGH (ref 4.6–6.5)

## 2015-09-17 ENCOUNTER — Ambulatory Visit (INDEPENDENT_AMBULATORY_CARE_PROVIDER_SITE_OTHER): Payer: BC Managed Care – PPO | Admitting: Internal Medicine

## 2015-09-17 ENCOUNTER — Encounter: Payer: Self-pay | Admitting: Internal Medicine

## 2015-09-17 VITALS — BP 126/78 | HR 87 | Temp 98.5°F | Resp 18 | Ht 67.0 in | Wt 202.1 lb

## 2015-09-17 DIAGNOSIS — R945 Abnormal results of liver function studies: Secondary | ICD-10-CM

## 2015-09-17 DIAGNOSIS — Z125 Encounter for screening for malignant neoplasm of prostate: Secondary | ICD-10-CM

## 2015-09-17 DIAGNOSIS — I10 Essential (primary) hypertension: Secondary | ICD-10-CM

## 2015-09-17 DIAGNOSIS — F1010 Alcohol abuse, uncomplicated: Secondary | ICD-10-CM

## 2015-09-17 DIAGNOSIS — E119 Type 2 diabetes mellitus without complications: Secondary | ICD-10-CM | POA: Diagnosis not present

## 2015-09-17 DIAGNOSIS — E669 Obesity, unspecified: Secondary | ICD-10-CM

## 2015-09-17 DIAGNOSIS — R195 Other fecal abnormalities: Secondary | ICD-10-CM | POA: Diagnosis not present

## 2015-09-17 DIAGNOSIS — R7989 Other specified abnormal findings of blood chemistry: Secondary | ICD-10-CM

## 2015-09-17 DIAGNOSIS — E7800 Pure hypercholesterolemia, unspecified: Secondary | ICD-10-CM

## 2015-09-17 NOTE — Progress Notes (Signed)
[name]: Phillip Shepard, male   DOB: 10-29-1965, 50 y.o.   MRN: [id number]   Subjective:    [name]: Phillip Shepard, male    DOB: Jun 29, 1966, 50 y.o.   MRN: [id number]  HPI  Patient with past history of hypercholesterolemia, GERD and hypertension.  He comes in today to follow up on these issues.  He stays active.  We discussed formal exercise.  No chest pain or tightness.  No sob.  No acid reflux.  No abdominal pain or cramping.  Bowels stable.  Discussed his sugar. a1c just checked - 7.9.  Decreased slightly.  He is not taking his medication as prescribed.  Misses pm doses.  We discussed the importance of taking his medication regularly.  Discussed low carb diet.  Discussed the need to decrease alcohol intake.  Triglycerides have improved.     Past Medical History  Diagnosis Date  . History of chickenpox   . Diabetes mellitus without complication (HCC)   . GERD (gastroesophageal reflux disease)   . Allergy   . Hypertension   . Hypercholesterolemia   . History of kidney stones   . Dysphagia     s/p esophageal dilatation  . Kidney stones 07/18/2013   Past Surgical History  Procedure Laterality Date  . Tonsillectomy  1970  . Esophageal dilation     Family History  Problem Relation Age of Onset  . Hyperlipidemia Mother   . Hypertension Father   . Arthritis Maternal Grandmother   . Diabetes Maternal Grandmother   . Lung cancer Maternal Grandfather   . Heart disease Paternal Grandfather   . Stroke Paternal Grandfather    Social History   Social History  . Marital Status: Married    Spouse Name: N/A  . Number of Children: N/A  . Years of Education: N/A   Social History Main Topics  . Smoking status: Never Smoker   . Smokeless tobacco: Former Neurosurgeon    Types: Chew     Comment: [place]  . Alcohol Use: 0.0 oz/week    0 Standard drinks or equivalent per week     Comment: drinks 3-4 beers per day  . Drug Use: No  . Sexual Activity: Not Asked   Other Topics  Concern  . None   Social History Narrative    Outpatient Encounter Prescriptions as of 09/17/2015  Medication Sig  . glimepiride (AMARYL) 2 MG tablet TAKE 1 TABLET BY MOUTH EVERY MORNING WITH BREAKFAST  . JANUMET XR 50-1000 MG TB24 TAKE 2 TABLETS BY MOUTH EVERY DAY  . lisinopril (PRINIVIL,ZESTRIL) 20 MG tablet Take 1 tablet (20 mg total) by mouth daily.   No facility-administered encounter medications on file as of 09/17/2015.    Review of Systems  Constitutional: Negative for appetite change and unexpected weight change.  HENT: Negative for congestion and sinus pressure.   Eyes: Negative for discharge and redness.  Respiratory: Negative for cough, chest tightness and shortness of breath.   Cardiovascular: Negative for chest pain, palpitations and leg swelling.  Gastrointestinal: Negative for nausea, vomiting, abdominal pain and diarrhea.  Genitourinary: Negative for dysuria and difficulty urinating.  Musculoskeletal: Negative for back pain and joint swelling.  Skin: Negative for color change and rash.  Neurological: Negative for dizziness, light-headedness and headaches.  Psychiatric/Behavioral: Negative for dysphoric mood and agitation.       Objective:    Physical Exam  Constitutional: He appears well-developed and well-nourished. No distress.  HENT:  Nose: Nose normal.  Mouth/Throat: Oropharynx is clear  and moist.  Eyes: Conjunctivae are normal. Right eye exhibits no discharge. Left eye exhibits no discharge.  Neck: Neck supple. No thyromegaly present.  Cardiovascular: Normal rate and regular rhythm.   Pulmonary/Chest: Effort normal and breath sounds normal. No respiratory distress.  Abdominal: Soft. Bowel sounds are normal. There is no tenderness.  Musculoskeletal: He exhibits no edema or tenderness.  Lymphadenopathy:    He has no cervical adenopathy.  Skin: Skin is warm. No rash noted. No erythema.  Psychiatric: He has a normal mood and affect. His behavior is  normal.    BP 126/78 mmHg  Pulse 87  Temp(Src) 98.5 F (36.9 C) (Oral)  Resp 18  Ht  (1.702 m)  Wt 202 lb 2 oz (91.683 kg)  BMI 31.65 kg/m2  SpO2 97% Wt Readings from Last 3 Encounters:  09/17/15 202 lb 2 oz (91.683 kg)  07/10/15 199 lb (90.266 kg)  04/30/15 198 lb (89.812 kg)     Lab Results  Component Value Date   WBC 8.7 12/04/2014   HGB 16.9 12/04/2014   HCT 48.2 12/04/2014   PLT 249.0 12/04/2014   GLUCOSE 207* 09/14/2015   CHOL 210* 09/14/2015   TRIG 348.0* 09/14/2015   HDL 47.70 09/14/2015   LDLDIRECT 121.0 09/14/2015   LDLCALC 129* 02/03/2014   ALT 79* 09/14/2015   AST 56* 09/14/2015   NA 136 09/14/2015   K 4.8 09/14/2015   CL 99 09/14/2015   CREATININE 0.82 09/14/2015   BUN 14 09/14/2015   CO2 25 09/14/2015   TSH 1.99 12/04/2014   PSA 0.18 07/17/2013   HGBA1C 7.9* 09/14/2015   MICROALBUR 3.9* 12/04/2014       Assessment & Plan:   Problem List Items Addressed This Visit    Abnormal liver function tests    Recent liver panel relatively stable.  Discussed abdominal ultrasound and further w/up.  He declines.  Follow.  Decrease alcohol intake.  Needs better control of his sugar and cholesterol.  Diet and weight loss.        Relevant Orders   Hepatic function panel   Alcohol abuse    Discussed the need to cut down on his alcohol intake.  Follow.        Diabetes (HCC)    Last a1c 7.9.  Discussed low carb diet and exercise.  Discussed importance of taking his medication regularly.  Will hold on changing or adding medication,since he is not taking as prescribed.  Follow sugars.  Decrease alcohol intake.        Relevant Orders   Hemoglobin A1c   Microalbumin / creatinine urine ratio   Essential hypertension, benign    Blood pressure under good control.  Continue same medication regimen.  Follow pressures.  Follow metabolic panel.        Relevant Orders   TSH   Basic metabolic panel   Heme positive stool - Primary    Discussed GI referral.   He declines.        Relevant Orders   Fecal occult blood, imunochemical   CBC with Differential/Platelet   Hypercholesterolemia    Triglycerides have improved.  Discussed diet and exercise.  Follow lipid panel.        Relevant Orders   Lipid panel   Obesity (BMI 30.0-34.9)    Discussed diet and exercise.         Other Visit Diagnoses    Prostate cancer screening        Relevant Orders    PSA  Einar Pheasant, MD

## 2015-09-17 NOTE — Progress Notes (Signed)
Pre-visit discussion using our clinic review tool. No additional management support is needed unless otherwise documented below in the visit note.  

## 2015-09-21 ENCOUNTER — Encounter: Payer: Self-pay | Admitting: Internal Medicine

## 2015-09-21 NOTE — Assessment & Plan Note (Signed)
Discussed diet and exercise 

## 2015-09-21 NOTE — Assessment & Plan Note (Signed)
Discussed the need to cut down on his alcohol intake.  Follow.

## 2015-09-21 NOTE — Assessment & Plan Note (Signed)
Blood pressure under good control.  Continue same medication regimen.  Follow pressures.  Follow metabolic panel.   

## 2015-09-21 NOTE — Assessment & Plan Note (Signed)
Last a1c 7.9.  Discussed low carb diet and exercise.  Discussed importance of taking his medication regularly.  Will hold on changing or adding medication,since he is not taking as prescribed.  Follow sugars.  Decrease alcohol intake.

## 2015-09-21 NOTE — Assessment & Plan Note (Signed)
Discussed GI referral.  He declines.

## 2015-09-21 NOTE — Assessment & Plan Note (Signed)
Triglycerides have improved.  Discussed diet and exercise.  Follow lipid panel.

## 2015-09-21 NOTE — Assessment & Plan Note (Signed)
Recent liver panel relatively stable.  Discussed abdominal ultrasound and further w/up.  He declines.  Follow.  Decrease alcohol intake.  Needs better control of his sugar and cholesterol.  Diet and weight loss.

## 2015-09-23 ENCOUNTER — Other Ambulatory Visit: Payer: Self-pay | Admitting: Internal Medicine

## 2015-11-11 ENCOUNTER — Other Ambulatory Visit: Payer: Self-pay | Admitting: Internal Medicine

## 2015-12-22 ENCOUNTER — Other Ambulatory Visit (INDEPENDENT_AMBULATORY_CARE_PROVIDER_SITE_OTHER): Payer: BC Managed Care – PPO

## 2015-12-22 DIAGNOSIS — R7989 Other specified abnormal findings of blood chemistry: Secondary | ICD-10-CM | POA: Diagnosis not present

## 2015-12-22 DIAGNOSIS — E119 Type 2 diabetes mellitus without complications: Secondary | ICD-10-CM | POA: Diagnosis not present

## 2015-12-22 DIAGNOSIS — R195 Other fecal abnormalities: Secondary | ICD-10-CM | POA: Diagnosis not present

## 2015-12-22 DIAGNOSIS — I10 Essential (primary) hypertension: Secondary | ICD-10-CM

## 2015-12-22 DIAGNOSIS — E7800 Pure hypercholesterolemia, unspecified: Secondary | ICD-10-CM

## 2015-12-22 DIAGNOSIS — R945 Abnormal results of liver function studies: Secondary | ICD-10-CM

## 2015-12-22 DIAGNOSIS — Z125 Encounter for screening for malignant neoplasm of prostate: Secondary | ICD-10-CM | POA: Diagnosis not present

## 2015-12-22 LAB — HEPATIC FUNCTION PANEL
ALT: 112 U/L — ABNORMAL HIGH (ref 0–53)
AST: 56 U/L — ABNORMAL HIGH (ref 0–37)
Albumin: 4.5 g/dL (ref 3.5–5.2)
Alkaline Phosphatase: 49 U/L (ref 39–117)
Bilirubin, Direct: 0.1 mg/dL (ref 0.0–0.3)
Total Bilirubin: 0.6 mg/dL (ref 0.2–1.2)
Total Protein: 6.8 g/dL (ref 6.0–8.3)

## 2015-12-22 LAB — HEMOGLOBIN A1C: Hgb A1c MFr Bld: 9.5 % — ABNORMAL HIGH (ref 4.6–6.5)

## 2015-12-22 LAB — TSH: TSH: 2 u[IU]/mL (ref 0.35–4.50)

## 2015-12-22 LAB — LIPID PANEL
Cholesterol: 233 mg/dL — ABNORMAL HIGH (ref 0–200)
HDL: 51.8 mg/dL (ref >39.00)
NonHDL: 181.19
Total CHOL/HDL Ratio: 4
Triglycerides: 244 mg/dL — ABNORMAL HIGH (ref 0.0–149.0)
VLDL: 48.8 mg/dL — ABNORMAL HIGH (ref 0.0–40.0)

## 2015-12-22 LAB — CBC WITH DIFFERENTIAL/PLATELET
Basophils Absolute: 0.1 10*3/uL (ref 0.0–0.1)
Basophils Relative: 1 % (ref 0.0–3.0)
Eosinophils Absolute: 0.3 10*3/uL (ref 0.0–0.7)
Eosinophils Relative: 5.1 % — ABNORMAL HIGH (ref 0.0–5.0)
HCT: 41.9 % (ref 39.0–52.0)
Hemoglobin: 14.5 g/dL (ref 13.0–17.0)
Lymphocytes Relative: 26.2 % (ref 12.0–46.0)
Lymphs Abs: 1.6 10*3/uL (ref 0.7–4.0)
MCHC: 34.7 g/dL (ref 30.0–36.0)
MCV: 96.9 fl (ref 78.0–100.0)
Monocytes Absolute: 0.4 10*3/uL (ref 0.1–1.0)
Monocytes Relative: 7.5 % (ref 3.0–12.0)
Neutro Abs: 3.6 10*3/uL (ref 1.4–7.7)
Neutrophils Relative %: 60.2 % (ref 43.0–77.0)
Platelets: 196 10*3/uL (ref 150.0–400.0)
RBC: 4.32 Mil/uL (ref 4.22–5.81)
RDW: 13.4 % (ref 11.5–15.5)
WBC: 6 10*3/uL (ref 4.0–10.5)

## 2015-12-22 LAB — BASIC METABOLIC PANEL
BUN: 13 mg/dL (ref 6–23)
CO2: 26 mEq/L (ref 19–32)
Calcium: 9.9 mg/dL (ref 8.4–10.5)
Chloride: 100 mEq/L (ref 96–112)
Creatinine, Ser: 0.86 mg/dL (ref 0.40–1.50)
GFR: 99.49 mL/min (ref >60.00)
Glucose, Bld: 247 mg/dL — ABNORMAL HIGH (ref 70–99)
Potassium: 5.1 mEq/L (ref 3.5–5.1)
Sodium: 137 mEq/L (ref 135–145)

## 2015-12-22 LAB — LDL CHOLESTEROL, DIRECT: Direct LDL: 140 mg/dL

## 2015-12-22 LAB — PSA: PSA: 0.18 ng/mL (ref 0.10–4.00)

## 2015-12-24 ENCOUNTER — Encounter: Payer: BC Managed Care – PPO | Admitting: Internal Medicine

## 2016-02-15 ENCOUNTER — Telehealth: Payer: Self-pay | Admitting: Internal Medicine

## 2016-02-15 NOTE — Telephone Encounter (Signed)
Called and lm for pt to call back to see if he could do the 6/29 @ 9am.. Please schedule when pt calls back

## 2016-02-16 ENCOUNTER — Other Ambulatory Visit: Payer: Self-pay | Admitting: Internal Medicine

## 2016-03-23 ENCOUNTER — Encounter: Payer: BC Managed Care – PPO | Admitting: Internal Medicine

## 2016-04-04 ENCOUNTER — Other Ambulatory Visit: Payer: Self-pay | Admitting: Internal Medicine

## 2016-04-18 ENCOUNTER — Ambulatory Visit (INDEPENDENT_AMBULATORY_CARE_PROVIDER_SITE_OTHER): Payer: BC Managed Care – PPO | Admitting: Internal Medicine

## 2016-04-18 ENCOUNTER — Encounter (INDEPENDENT_AMBULATORY_CARE_PROVIDER_SITE_OTHER): Payer: Self-pay

## 2016-04-18 ENCOUNTER — Encounter: Payer: Self-pay | Admitting: Internal Medicine

## 2016-04-18 VITALS — BP 126/78 | HR 92 | Temp 98.5°F | Resp 16 | Ht 67.5 in | Wt 200.0 lb

## 2016-04-18 DIAGNOSIS — I10 Essential (primary) hypertension: Secondary | ICD-10-CM

## 2016-04-18 DIAGNOSIS — R7989 Other specified abnormal findings of blood chemistry: Secondary | ICD-10-CM

## 2016-04-18 DIAGNOSIS — E119 Type 2 diabetes mellitus without complications: Secondary | ICD-10-CM

## 2016-04-18 DIAGNOSIS — Z0000 Encounter for general adult medical examination without abnormal findings: Secondary | ICD-10-CM

## 2016-04-18 DIAGNOSIS — E7800 Pure hypercholesterolemia, unspecified: Secondary | ICD-10-CM

## 2016-04-18 DIAGNOSIS — F1010 Alcohol abuse, uncomplicated: Secondary | ICD-10-CM

## 2016-04-18 DIAGNOSIS — R195 Other fecal abnormalities: Secondary | ICD-10-CM

## 2016-04-18 DIAGNOSIS — E669 Obesity, unspecified: Secondary | ICD-10-CM | POA: Diagnosis not present

## 2016-04-18 DIAGNOSIS — R945 Abnormal results of liver function studies: Secondary | ICD-10-CM

## 2016-04-18 DIAGNOSIS — K219 Gastro-esophageal reflux disease without esophagitis: Secondary | ICD-10-CM

## 2016-04-18 MED ORDER — TADALAFIL 5 MG PO TABS: ORAL_TABLET | ORAL | 0 refills | Status: DC

## 2016-04-18 NOTE — Progress Notes (Signed)
[name]: Phillip Shepard, male   DOB: 1965-11-08, 50 y.o.   MRN: [id number]   Subjective:    [name]: Phillip Shepard, male    DOB: 08/10/1966, 50 y.o.   MRN: [id number]  HPI  Patient here for his physical exam.  States he is doing relatively well.  Stays active.  No cardiac symptoms with increased activity or exertion.  No sob.  No acid reflux.  No abdominal pain or cramping.  Bowels stable.  Sugars elevated.  States highest reading 230.  States sugar improved for a while and then now elevated again.  Has decreased his alcohol intake.  States sugars seem to increased when he cut back on his alcohol intake.  He also reports problems with sustaining an erection.  Can obtain an erection, but trouble sustaining an erection.     Past Medical History:  Diagnosis Date  . Allergy   . Diabetes mellitus without complication ([place])   . Dysphagia    s/p esophageal dilatation  . GERD (gastroesophageal reflux disease)   . History of chickenpox   . History of kidney stones   . Hypercholesterolemia   . Hypertension   . Kidney stones 07/18/2013   Past Surgical History:  Procedure Laterality Date  . ESOPHAGEAL DILATION    . TONSILLECTOMY  1970   Family History  Problem Relation Age of Onset  . Hyperlipidemia Mother   . Hypertension Father   . Arthritis Maternal Grandmother   . Diabetes Maternal Grandmother   . Lung cancer Maternal Grandfather   . Heart disease Paternal Grandfather   . Stroke Paternal Grandfather    Social History   Social History  . Marital status: Married    Spouse name: Diane  . Number of children: 2  . Years of education: N/A   Occupational History  .  [employer]   Social History Main Topics  . Smoking status: Never Smoker  . Smokeless tobacco: Former Systems developer    Types: Chew     Comment: [address]  . Alcohol use 0.0 oz/week     Comment: drinks 3-4 beers per day  . Drug use: No  . Sexual activity: Yes   Other Topics Concern  . None   Social  History Narrative  . None    Outpatient Encounter Prescriptions as of 04/18/2016  Medication Sig  . glimepiride (AMARYL) 2 MG tablet TAKE 2 TABLETS BY MOUTH EVERY DAY  . JANUMET XR 50-1000 MG TB24 TAKE 2 TABLETS BY MOUTH EVERY DAY  . lisinopril (PRINIVIL,ZESTRIL) 20 MG tablet Take 1 tablet (20 mg total) by mouth daily.  Marland Kitchen omeprazole (PRILOSEC) 20 MG capsule Take 20 mg by mouth daily.  . tadalafil (CIALIS) 5 MG tablet One tablet q 72 hours as directed.   No facility-administered encounter medications on file as of 04/18/2016.     Review of Systems  Constitutional: Negative for appetite change and unexpected weight change.  HENT: Negative for congestion and sinus pressure.   Eyes: Negative for pain and visual disturbance.  Respiratory: Negative for cough, chest tightness and shortness of breath.   Cardiovascular: Negative for chest pain, palpitations and leg swelling.  Gastrointestinal: Negative for abdominal pain, diarrhea, nausea and vomiting.  Genitourinary: Negative for difficulty urinating and dysuria.       Describes having problems sustaining an erection.    Musculoskeletal: Negative for back pain and joint swelling.  Skin: Negative for color change and rash.  Neurological: Negative for dizziness, light-headedness and headaches.  Hematological: Negative  for adenopathy. Does not bruise/bleed easily.  Psychiatric/Behavioral: Negative for agitation and dysphoric mood.       Objective:     Blood pressure rechecked by me;  128/80  Physical Exam  Constitutional: He is oriented to person, place, and time. He appears well-developed and well-nourished. No distress.  HENT:  Head: Normocephalic and atraumatic.  Nose: Nose normal.  Mouth/Throat: Oropharynx is clear and moist. No oropharyngeal exudate.  Eyes: Conjunctivae are normal. Right eye exhibits no discharge. Left eye exhibits no discharge.  Neck: Neck supple. No thyromegaly present.  Cardiovascular: Normal rate and regular  rhythm.   Pulmonary/Chest: Breath sounds normal. No respiratory distress. He has no wheezes.  Abdominal: Soft. Bowel sounds are normal. There is no tenderness.  Genitourinary: Rectum normal.  Genitourinary Comments: Rectal exam - no palpable prostate nodules.    Musculoskeletal: He exhibits no edema or tenderness.  Lymphadenopathy:    He has no cervical adenopathy.  Neurological: He is alert and oriented to person, place, and time.  Skin: Skin is warm and dry. No rash noted. No erythema.  Psychiatric: He has a normal mood and affect. His behavior is normal.    BP 126/78 (BP Location: Left Arm, Patient Position: Sitting, Cuff Size: Large)   Pulse 92   Temp 98.5 F (36.9 C) (Oral)   Resp 16   Ht 5' 7.5" (1.715 m)   Wt 200 lb (90.7 kg)   BMI 30.86 kg/m  Wt Readings from Last 3 Encounters:  04/18/16 200 lb (90.7 kg)  09/17/15 202 lb 2 oz (91.7 kg)  07/10/15 199 lb (90.3 kg)     Lab Results  Component Value Date   WBC 6.0 12/22/2015   HGB 14.5 12/22/2015   HCT 41.9 12/22/2015   PLT 196.0 12/22/2015   GLUCOSE 247 (H) 12/22/2015   CHOL 233 (H) 12/22/2015   TRIG 244.0 (H) 12/22/2015   HDL 51.80 12/22/2015   LDLDIRECT 140.0 12/22/2015   LDLCALC 129 (H) 02/03/2014   ALT 112 (H) 12/22/2015   AST 56 (H) 12/22/2015   NA 137 12/22/2015   K 5.1 12/22/2015   CL 100 12/22/2015   CREATININE 0.86 12/22/2015   BUN 13 12/22/2015   CO2 26 12/22/2015   TSH 2.00 12/22/2015   PSA 0.18 12/22/2015   HGBA1C 9.5 (H) 12/22/2015   MICROALBUR 3.9 (H) 12/04/2014        Assessment & Plan:   Problem List Items Addressed This Visit    Abnormal liver function tests    Declines abdominal ultrasound.  Has decreased alcohol intake.  Follow liver panel.        Alcohol abuse    Has cut down significantly on alcohol intake.  Follow.       Diabetes ([place])    a1c elevated on last check.  No recorded sugars.  Low carb diet.  Follow met b and a1c.  Discussed importance of diet and exercise.          Relevant Orders   EKG 12-Lead (Completed)   Hemoglobin N3I   Basic metabolic panel   Essential hypertension, benign    Blood pressure under good control.  Continue same medication regimen.  Follow pressures.  Follow metabolic panel.        Relevant Medications   tadalafil (CIALIS) 5 MG tablet   GERD (gastroesophageal reflux disease)    No upper symptoms reported.       Relevant Medications   omeprazole (PRILOSEC) 20 MG capsule   Heme positive stool  Again discussed GI referral.  He declines.  Will notify me when agreeable.  No further bleeding.       Hypercholesterolemia    Low cholesterol diet and exercise.  Follow lipid panel.  If persistent elevation, start statin medication.       Relevant Medications   tadalafil (CIALIS) 5 MG tablet   Other Relevant Orders   Hepatic function panel   Lipid panel   Obesity (BMI 30.0-34.9)    Diet and exercise.  Follow.        Other Visit Diagnoses    Routine general medical examination at a health care facility    -  Primary       Einar Pheasant, MD

## 2016-04-24 ENCOUNTER — Encounter: Payer: Self-pay | Admitting: Internal Medicine

## 2016-04-24 NOTE — Assessment & Plan Note (Signed)
Again discussed GI referral.  He declines.  Will notify me when agreeable.  No further bleeding.

## 2016-04-24 NOTE — Assessment & Plan Note (Signed)
a1c elevated on last check.  No recorded sugars.  Low carb diet.  Follow met b and a1c.  Discussed importance of diet and exercise.

## 2016-04-24 NOTE — Assessment & Plan Note (Signed)
Has cut down significantly on alcohol intake.  Follow.

## 2016-04-24 NOTE — Assessment & Plan Note (Signed)
Low cholesterol diet and exercise.  Follow lipid panel.  If persistent elevation, start statin medication.

## 2016-04-24 NOTE — Assessment & Plan Note (Signed)
Declines abdominal ultrasound.  Has decreased alcohol intake.  Follow liver panel.

## 2016-04-24 NOTE — Assessment & Plan Note (Signed)
Diet and exercise.  Follow.  

## 2016-04-24 NOTE — Assessment & Plan Note (Signed)
Blood pressure under good control.  Continue same medication regimen.  Follow pressures.  Follow metabolic panel.   

## 2016-04-24 NOTE — Assessment & Plan Note (Signed)
No upper symptoms reported.   

## 2016-04-29 ENCOUNTER — Other Ambulatory Visit: Payer: BC Managed Care – PPO

## 2016-04-29 ENCOUNTER — Telehealth: Payer: Self-pay | Admitting: *Deleted

## 2016-04-29 NOTE — Telephone Encounter (Signed)
Received a letter of denial for Cialis. Form placed in Dr. Lorin PicketScott red folder for appeal letter.

## 2016-05-01 NOTE — Telephone Encounter (Signed)
I have looked through my papers and do not have form.

## 2016-05-03 NOTE — Telephone Encounter (Signed)
Per Dr. Lorin PicketScott, form was found in folder.

## 2016-05-06 ENCOUNTER — Encounter: Payer: Self-pay | Admitting: Internal Medicine

## 2016-05-06 NOTE — Telephone Encounter (Signed)
Checking on status of form just in case I missed it

## 2016-05-06 NOTE — Telephone Encounter (Signed)
[facility] to [facility]

## 2016-05-06 NOTE — Telephone Encounter (Signed)
Note typed and printed.   In box.  Please forward.  Thanks

## 2016-05-09 ENCOUNTER — Other Ambulatory Visit: Payer: BC Managed Care – PPO

## 2016-06-14 ENCOUNTER — Other Ambulatory Visit: Payer: Self-pay | Admitting: Internal Medicine

## 2016-06-27 ENCOUNTER — Other Ambulatory Visit: Payer: Self-pay | Admitting: Internal Medicine

## 2016-06-30 ENCOUNTER — Other Ambulatory Visit: Payer: Self-pay | Admitting: Internal Medicine

## 2016-07-22 ENCOUNTER — Encounter: Payer: Self-pay | Admitting: Internal Medicine

## 2016-07-22 ENCOUNTER — Ambulatory Visit (INDEPENDENT_AMBULATORY_CARE_PROVIDER_SITE_OTHER): Payer: BC Managed Care – PPO | Admitting: Internal Medicine

## 2016-07-22 DIAGNOSIS — R195 Other fecal abnormalities: Secondary | ICD-10-CM

## 2016-07-22 DIAGNOSIS — E669 Obesity, unspecified: Secondary | ICD-10-CM

## 2016-07-22 DIAGNOSIS — R945 Abnormal results of liver function studies: Secondary | ICD-10-CM

## 2016-07-22 DIAGNOSIS — F1010 Alcohol abuse, uncomplicated: Secondary | ICD-10-CM

## 2016-07-22 DIAGNOSIS — E7800 Pure hypercholesterolemia, unspecified: Secondary | ICD-10-CM | POA: Diagnosis not present

## 2016-07-22 DIAGNOSIS — R7989 Other specified abnormal findings of blood chemistry: Secondary | ICD-10-CM

## 2016-07-22 DIAGNOSIS — E119 Type 2 diabetes mellitus without complications: Secondary | ICD-10-CM | POA: Diagnosis not present

## 2016-07-22 DIAGNOSIS — K219 Gastro-esophageal reflux disease without esophagitis: Secondary | ICD-10-CM

## 2016-07-22 DIAGNOSIS — I10 Essential (primary) hypertension: Secondary | ICD-10-CM

## 2016-07-22 LAB — HEPATIC FUNCTION PANEL
ALT: 91 U/L — ABNORMAL HIGH (ref 0–53)
AST: 65 U/L — ABNORMAL HIGH (ref 0–37)
Albumin: 4.4 g/dL (ref 3.5–5.2)
Alkaline Phosphatase: 53 U/L (ref 39–117)
Bilirubin, Direct: 0.1 mg/dL (ref 0.0–0.3)
Total Bilirubin: 0.6 mg/dL (ref 0.2–1.2)
Total Protein: 6.8 g/dL (ref 6.0–8.3)

## 2016-07-22 LAB — LIPID PANEL
Cholesterol: 250 mg/dL — ABNORMAL HIGH (ref 0–200)
HDL: 52.7 mg/dL (ref >39.00)
NonHDL: 197.38
Total CHOL/HDL Ratio: 5
Triglycerides: 319 mg/dL — ABNORMAL HIGH (ref 0.0–149.0)
VLDL: 63.8 mg/dL — ABNORMAL HIGH (ref 0.0–40.0)

## 2016-07-22 LAB — BASIC METABOLIC PANEL
BUN: 14 mg/dL (ref 6–23)
CO2: 28 mEq/L (ref 19–32)
Calcium: 9.3 mg/dL (ref 8.4–10.5)
Chloride: 104 mEq/L (ref 96–112)
Creatinine, Ser: 0.82 mg/dL (ref 0.40–1.50)
GFR: 104.87 mL/min (ref >60.00)
Glucose, Bld: 238 mg/dL — ABNORMAL HIGH (ref 70–99)
Potassium: 4.1 mEq/L (ref 3.5–5.1)
Sodium: 142 mEq/L (ref 135–145)

## 2016-07-22 LAB — HEMOGLOBIN A1C: Hgb A1c MFr Bld: 9.6 % — ABNORMAL HIGH (ref 4.6–6.5)

## 2016-07-22 LAB — LDL CHOLESTEROL, DIRECT: Direct LDL: 136 mg/dL

## 2016-07-22 NOTE — Progress Notes (Signed)
[name]: Phillip Shepard, male   DOB: 1966/07/08, 50 y.o.   MRN: [id number]   Subjective:    [name]: Phillip Shepard, male    DOB: 1965/11/09, 50 y.o.   MRN: [id number]  HPI  Patient here for a scheduled follow up.  He has not been checking his blood sugars.  States when he has checked they were ranging 140-200.  Not checked lately.  States has to get more strips.  Stays active.  No chest pain.  No sob.  No acid reflux.  No abdominal pain or cramping.  Bowels stable.  He has decreased his alcohol intake.  Discussed diet and exercise.  Discussed importance of checking his blood sugars.     Past Medical History:  Diagnosis Date  . Allergy   . Diabetes mellitus without complication ([place])   . Dysphagia    s/p esophageal dilatation  . GERD (gastroesophageal reflux disease)   . History of chickenpox   . History of kidney stones   . Hypercholesterolemia   . Hypertension   . Kidney stones 07/18/2013   Past Surgical History:  Procedure Laterality Date  . ESOPHAGEAL DILATION    . TONSILLECTOMY  1970   Family History  Problem Relation Age of Onset  . Hyperlipidemia Mother   . Hypertension Father   . Arthritis Maternal Grandmother   . Diabetes Maternal Grandmother   . Lung cancer Maternal Grandfather   . Heart disease Paternal Grandfather   . Stroke Paternal Grandfather    Social History   Social History  . Marital status: Married    Spouse name: Diane  . Number of children: 2  . Years of education: N/A   Occupational History  .  [employer]   Social History Main Topics  . Smoking status: Never Smoker  . Smokeless tobacco: Former Systems developer    Types: Chew     Comment: [place]  . Alcohol use 0.0 oz/week     Comment: drinks 3-4 beers per day  . Drug use: No  . Sexual activity: Yes   Other Topics Concern  . None   Social History Narrative  . None    Outpatient Encounter Prescriptions as of 07/22/2016  Medication Sig  . glimepiride (AMARYL) 2 MG tablet  TAKE 2 TABLETS BY MOUTH EVERY DAY  . JANUMET XR 50-1000 MG TB24 TAKE 2 TABLETS BY MOUTH EVERY DAY  . lisinopril (PRINIVIL,ZESTRIL) 20 MG tablet TAKE 1 TABLET (20 MG TOTAL) BY MOUTH DAILY.  Marland Kitchen omeprazole (PRILOSEC) 20 MG capsule Take 20 mg by mouth daily.  . tadalafil (CIALIS) 5 MG tablet One tablet q 72 hours as directed.   No facility-administered encounter medications on file as of 07/22/2016.     Review of Systems  Constitutional: Negative for appetite change and unexpected weight change.  HENT: Negative for sinus pain and sore throat.   Respiratory: Negative for cough, chest tightness and shortness of breath.   Cardiovascular: Negative for chest pain, palpitations and leg swelling.  Gastrointestinal: Negative for abdominal pain, diarrhea, nausea and vomiting.  Genitourinary: Negative for difficulty urinating and dysuria.  Musculoskeletal: Negative for back pain and joint swelling.  Skin: Negative for color change and rash.  Neurological: Negative for dizziness, light-headedness and headaches.  Psychiatric/Behavioral: Negative for agitation and dysphoric mood.       Objective:     Blood pressure rechecked by me:  130/84  Physical Exam  Constitutional: He appears well-developed and well-nourished. No distress.  HENT:  Nose: Nose normal.  Mouth/Throat: Oropharynx is clear and moist.  Neck: Neck supple. No thyromegaly present.  Cardiovascular: Normal rate and regular rhythm.   Pulmonary/Chest: Effort normal and breath sounds normal. No respiratory distress.  Abdominal: Soft. Bowel sounds are normal. There is no tenderness.  Musculoskeletal: He exhibits no edema or tenderness.  Lymphadenopathy:    He has no cervical adenopathy.  Skin: No rash noted. No erythema.  Psychiatric: He has a normal mood and affect. His behavior is normal.    BP 130/84   Pulse 94   Temp 98.3 F (36.8 C) (Oral)   Ht _0  (1.727 m)   Wt 203 lb 9.6 oz (92.4 kg)   SpO2 95%   BMI 30.96 kg/m  Wt  Readings from Last 3 Encounters:  07/22/16 203 lb 9.6 oz (92.4 kg)  04/18/16 200 lb (90.7 kg)  09/17/15 202 lb 2 oz (91.7 kg)     Lab Results  Component Value Date   WBC 6.0 12/22/2015   HGB 14.5 12/22/2015   HCT 41.9 12/22/2015   PLT 196.0 12/22/2015   GLUCOSE 238 (H) 07/22/2016   CHOL 250 (H) 07/22/2016   TRIG 319.0 (H) 07/22/2016   HDL 52.70 07/22/2016   LDLDIRECT 136.0 07/22/2016   LDLCALC 129 (H) 02/03/2014   ALT 91 (H) 07/22/2016   AST 65 (H) 07/22/2016   NA 142 07/22/2016   K 4.1 07/22/2016   CL 104 07/22/2016   CREATININE 0.82 07/22/2016   BUN 14 07/22/2016   CO2 28 07/22/2016   TSH 2.00 12/22/2015   PSA 0.18 12/22/2015   HGBA1C 9.6 (H) 07/22/2016   MICROALBUR 3.9 (H) 12/04/2014       Assessment & Plan:   Problem List Items Addressed This Visit    Abnormal liver function tests    Has declined abdominal ultrasound.  Recheck liver panel today.        Alcohol abuse    Has cut down.  Follow.  Discussed with him today.        Diabetes [facility])    Not checking his sugars.  Discussed importance of checking his sugars.  Discussed importance of diet and exercise and taking medication regularly. Discussed the need to continue with decrease alcohol intake.  Exercise.  Follow met b and a1c.       Essential hypertension, benign    Blood pressure elevated initially.  Recheck improved.  Have him spot check his pressure.  Check metabolic panel.        GERD (gastroesophageal reflux disease)    Upper symptoms.  On prilosec.        Heme positive stool    He has declined GI evaluation. Discussed with him today.  Will notify me when agreeable for referral.       Hypercholesterolemia    Low cholesterol diet and exercise.  Follow lipid panel. Recheck lipid panel today.        Obesity (BMI 30.0-34.9)    Discussed diet and exercise.  Follow.            Einar Pheasant, MD

## 2016-07-22 NOTE — Progress Notes (Signed)
Pre visit review using our clinic review tool, if applicable. No additional management support is needed unless otherwise documented below in the visit note. 

## 2016-07-24 ENCOUNTER — Encounter: Payer: Self-pay | Admitting: Internal Medicine

## 2016-07-24 NOTE — Assessment & Plan Note (Signed)
Discussed diet and exercise.  Follow.  

## 2016-07-24 NOTE — Assessment & Plan Note (Signed)
Not checking his sugars.  Discussed importance of checking his sugars.  Discussed importance of diet and exercise and taking medication regularly. Discussed the need to continue with decrease alcohol intake.  Exercise.  Follow met b and a1c.

## 2016-07-24 NOTE — Assessment & Plan Note (Signed)
Has cut down.  Follow.  Discussed with him today.

## 2016-07-24 NOTE — Assessment & Plan Note (Signed)
Blood pressure elevated initially.  Recheck improved.  Have him spot check his pressure.  Check metabolic panel.

## 2016-07-24 NOTE — Assessment & Plan Note (Signed)
Low cholesterol diet and exercise.  Follow lipid panel. Recheck lipid panel today.

## 2016-07-24 NOTE — Assessment & Plan Note (Signed)
Has declined abdominal ultrasound.  Recheck liver panel today.

## 2016-07-24 NOTE — Assessment & Plan Note (Signed)
He has declined GI evaluation. Discussed with him today.  Will notify me when agreeable for referral.

## 2016-07-24 NOTE — Assessment & Plan Note (Signed)
Upper symptoms.  On prilosec.

## 2016-08-23 ENCOUNTER — Other Ambulatory Visit: Payer: Self-pay | Admitting: Internal Medicine

## 2016-09-23 ENCOUNTER — Other Ambulatory Visit: Payer: Self-pay | Admitting: Internal Medicine

## 2016-09-30 ENCOUNTER — Ambulatory Visit (INDEPENDENT_AMBULATORY_CARE_PROVIDER_SITE_OTHER): Payer: BC Managed Care – PPO | Admitting: Internal Medicine

## 2016-09-30 ENCOUNTER — Encounter: Payer: Self-pay | Admitting: Internal Medicine

## 2016-09-30 VITALS — BP 130/76 | HR 64 | Temp 98.5°F | Resp 16 | Ht 68.0 in | Wt 202.2 lb

## 2016-09-30 DIAGNOSIS — E7800 Pure hypercholesterolemia, unspecified: Secondary | ICD-10-CM

## 2016-09-30 DIAGNOSIS — K219 Gastro-esophageal reflux disease without esophagitis: Secondary | ICD-10-CM

## 2016-09-30 DIAGNOSIS — F1010 Alcohol abuse, uncomplicated: Secondary | ICD-10-CM

## 2016-09-30 DIAGNOSIS — E119 Type 2 diabetes mellitus without complications: Secondary | ICD-10-CM | POA: Diagnosis not present

## 2016-09-30 DIAGNOSIS — E66811 Obesity, class 1: Secondary | ICD-10-CM

## 2016-09-30 DIAGNOSIS — R7989 Other specified abnormal findings of blood chemistry: Secondary | ICD-10-CM

## 2016-09-30 DIAGNOSIS — R945 Abnormal results of liver function studies: Secondary | ICD-10-CM

## 2016-09-30 DIAGNOSIS — E669 Obesity, unspecified: Secondary | ICD-10-CM | POA: Diagnosis not present

## 2016-09-30 DIAGNOSIS — I10 Essential (primary) hypertension: Secondary | ICD-10-CM

## 2016-09-30 NOTE — Progress Notes (Signed)
Pre-visit discussion using our clinic review tool. No additional management support is needed unless otherwise documented below in the visit note.  

## 2016-09-30 NOTE — Progress Notes (Signed)
[name]: Phillip Shepard, male   DOB: 03/28/1966, 51 y.o.   MRN: [id number]   Subjective:    [name]: Phillip Shepard, male    DOB: [birth year], 51 y.o.   MRN: [id number]  HPI  Patient here for a scheduled follow up.  Has known diabetes.  Sugar is poorly controlled.  States am sugars averaging 170-190, pm sugars 210 and sugars before bed averaging 160.  He is not watching what he eats.  Not exercising.  No chest pain.  No sob.  No acid reflux.  No abdominal pain.  Bowels moving.  Taking medication.  Discussed treatment changes.  He desires not to do injectable medications.     Past Medical History:  Diagnosis Date  . Allergy   . Diabetes mellitus without complication ([facility])   . Dysphagia    s/p esophageal dilatation  . GERD (gastroesophageal reflux disease)   . History of chickenpox   . History of kidney stones   . Hypercholesterolemia   . Hypertension   . Kidney stones 07/18/2013   Past Surgical History:  Procedure Laterality Date  . ESOPHAGEAL DILATION    . TONSILLECTOMY  1970   Family History  Problem Relation Age of Onset  . Hyperlipidemia Mother   . Hypertension Father   . Arthritis Maternal Grandmother   . Diabetes Maternal Grandmother   . Lung cancer Maternal Grandfather   . Heart disease Paternal Grandfather   . Stroke Paternal Grandfather    Social History   Social History  . Marital status: Married    Spouse name: Diane  . Number of children: 2  . Years of education: N/A   Occupational History  .  [employer]   Social History Main Topics  . Smoking status: Never Smoker  . Smokeless tobacco: Former Neurosurgeon    Types: Chew     Comment: [place]  . Alcohol use 0.0 oz/week     Comment: drinks 3-4 beers per day  . Drug use: No  . Sexual activity: Yes   Other Topics Concern  . None   Social History Narrative  . None    Outpatient Encounter Prescriptions as of 09/30/2016  Medication Sig  . glimepiride (AMARYL) 2 MG tablet TAKE 2 TABLETS BY  MOUTH EVERY DAY  . JANUMET XR 50-1000 MG TB24 TAKE 2 TABLETS BY MOUTH EVERY DAY  . lisinopril (PRINIVIL,ZESTRIL) 20 MG tablet TAKE 1 TABLET (20 MG TOTAL) BY MOUTH DAILY.  Marland Kitchen omeprazole (PRILOSEC) 20 MG capsule Take 20 mg by mouth daily.  . tadalafil (CIALIS) 5 MG tablet One tablet q 72 hours as directed.   No facility-administered encounter medications on file as of 09/30/2016.     Review of Systems  Constitutional: Negative for appetite change and unexpected weight change.  HENT: Negative for congestion and sinus pressure.   Respiratory: Negative for cough, chest tightness and shortness of breath.   Cardiovascular: Negative for chest pain, palpitations and leg swelling.  Gastrointestinal: Negative for abdominal pain, diarrhea, nausea and vomiting.  Genitourinary: Negative for difficulty urinating and dysuria.  Musculoskeletal: Negative for back pain and joint swelling.  Skin: Negative for color change and rash.  Neurological: Negative for dizziness, light-headedness and headaches.  Psychiatric/Behavioral: Negative for agitation and dysphoric mood.       Objective:    Physical Exam  Constitutional: He appears well-developed and well-nourished. No distress.  HENT:  Nose: Nose normal.  Mouth/Throat: Oropharynx is clear and moist.  Neck: Neck supple. No thyromegaly present.  Cardiovascular: Normal rate and regular rhythm.   Pulmonary/Chest: Effort normal and breath sounds normal. No respiratory distress.  Abdominal: Soft. Bowel sounds are normal. There is no tenderness.  Musculoskeletal: He exhibits no edema or tenderness.  Lymphadenopathy:    He has no cervical adenopathy.  Skin: No rash noted. No erythema.  Psychiatric: He has a normal mood and affect. His behavior is normal.    BP 130/76 (BP Location: Left Arm, Patient Position: Sitting, Cuff Size: Large)   Pulse 64   Temp 98.5 F (36.9 C) (Oral)   Resp 16   Ht 5\' 8"  (1.727 m)   Wt 202 lb 3.2 oz (91.7 kg)   SpO2 98%    BMI 30.74 kg/m  Wt Readings from Last 3 Encounters:  09/30/16 202 lb 3.2 oz (91.7 kg)  07/22/16 203 lb 9.6 oz (92.4 kg)  04/18/16 200 lb (90.7 kg)     Lab Results  Component Value Date   WBC 6.0 12/22/2015   HGB 14.5 12/22/2015   HCT 41.9 12/22/2015   PLT 196.0 12/22/2015   GLUCOSE 238 (H) 07/22/2016   CHOL 250 (H) 07/22/2016   TRIG 319.0 (H) 07/22/2016   HDL 52.70 07/22/2016   LDLDIRECT 136.0 07/22/2016   LDLCALC 129 (H) 02/03/2014   ALT 91 (H) 07/22/2016   AST 65 (H) 07/22/2016   NA 142 07/22/2016   K 4.1 07/22/2016   CL 104 07/22/2016   CREATININE 0.82 07/22/2016   BUN 14 07/22/2016   CO2 28 07/22/2016   TSH 2.00 12/22/2015   PSA 0.18 12/22/2015   HGBA1C 9.6 (H) 07/22/2016   MICROALBUR 3.9 (H) 12/04/2014       Assessment & Plan:   Problem List Items Addressed This Visit    Abnormal liver function tests    Declines ultrasound.  Discussed with him today.  He continues to decline.  Diet, exercise and weight loss.  Follow liver panel.        Alcohol abuse    Discussed the need to cut down the amount he drinks.  Follow.        Diabetes (HCC) - Primary    Low carb diet and exercise.  Discussed changing medications.  He desires not to do injectable medications.  Given persistent increase in his sugars, will refer to endocrinology for further evaluation and treatment.        Relevant Orders   Ambulatory referral to Endocrinology   Essential hypertension, benign    Blood pressure under good control.  Continue same medication regimen.  Follow pressures.  Follow metabolic panel.        GERD (gastroesophageal reflux disease) (Chronic)    On prilosec.  Controlled.        Hypercholesterolemia    Low cholesterol diet and exercise.  Follow lipid panel.        Obesity (BMI 30.0-34.9)    Diet and exercise.  Follow.            Phillip Shepard, Paisleigh Maroney, MD

## 2016-10-07 ENCOUNTER — Telehealth: Payer: Self-pay | Admitting: *Deleted

## 2016-10-09 ENCOUNTER — Encounter: Payer: Self-pay | Admitting: Internal Medicine

## 2016-10-09 NOTE — Assessment & Plan Note (Signed)
Discussed the need to cut down the amount he drinks.  Follow.

## 2016-10-09 NOTE — Assessment & Plan Note (Signed)
Low carb diet and exercise.  Discussed changing medications.  He desires not to do injectable medications.  Given persistent increase in his sugars, will refer to endocrinology for further evaluation and treatment.

## 2016-10-09 NOTE — Assessment & Plan Note (Signed)
Low cholesterol diet and exercise.  Follow lipid panel.   

## 2016-10-09 NOTE — Assessment & Plan Note (Signed)
Declines ultrasound.  Discussed with him today.  He continues to decline.  Diet, exercise and weight loss.  Follow liver panel.

## 2016-10-09 NOTE — Assessment & Plan Note (Signed)
Blood pressure under good control.  Continue same medication regimen.  Follow pressures.  Follow metabolic panel.   

## 2016-10-09 NOTE — Assessment & Plan Note (Signed)
Diet and exercise.  Follow.  

## 2016-10-09 NOTE — Assessment & Plan Note (Signed)
On prilosec.  Controlled.   

## 2016-10-10 ENCOUNTER — Ambulatory Visit: Payer: BC Managed Care – PPO | Admitting: Family

## 2016-10-26 ENCOUNTER — Other Ambulatory Visit: Payer: Self-pay | Admitting: Internal Medicine

## 2016-12-19 ENCOUNTER — Other Ambulatory Visit: Payer: Self-pay

## 2016-12-19 MED ORDER — GLIMEPIRIDE 2 MG PO TABS: 4.0000 mg | ORAL_TABLET | Freq: Every day | ORAL | 0 refills | Status: DC

## 2017-01-12 ENCOUNTER — Ambulatory Visit (INDEPENDENT_AMBULATORY_CARE_PROVIDER_SITE_OTHER): Payer: BC Managed Care – PPO | Admitting: Internal Medicine

## 2017-01-12 ENCOUNTER — Encounter: Payer: Self-pay | Admitting: Internal Medicine

## 2017-01-12 VITALS — BP 130/64 | HR 100 | Temp 98.6°F | Resp 12 | Ht 68.0 in | Wt 199.2 lb

## 2017-01-12 DIAGNOSIS — E669 Obesity, unspecified: Secondary | ICD-10-CM | POA: Diagnosis not present

## 2017-01-12 DIAGNOSIS — F1010 Alcohol abuse, uncomplicated: Secondary | ICD-10-CM | POA: Diagnosis not present

## 2017-01-12 DIAGNOSIS — I10 Essential (primary) hypertension: Secondary | ICD-10-CM

## 2017-01-12 DIAGNOSIS — K219 Gastro-esophageal reflux disease without esophagitis: Secondary | ICD-10-CM | POA: Diagnosis not present

## 2017-01-12 DIAGNOSIS — R945 Abnormal results of liver function studies: Secondary | ICD-10-CM

## 2017-01-12 DIAGNOSIS — E119 Type 2 diabetes mellitus without complications: Secondary | ICD-10-CM

## 2017-01-12 DIAGNOSIS — R7989 Other specified abnormal findings of blood chemistry: Secondary | ICD-10-CM

## 2017-01-12 DIAGNOSIS — E7800 Pure hypercholesterolemia, unspecified: Secondary | ICD-10-CM

## 2017-01-12 LAB — HEPATIC FUNCTION PANEL
ALT: 142 U/L — ABNORMAL HIGH (ref 0–53)
AST: 108 U/L — ABNORMAL HIGH (ref 0–37)
Albumin: 4.6 g/dL (ref 3.5–5.2)
Alkaline Phosphatase: 57 U/L (ref 39–117)
Bilirubin, Direct: 0.2 mg/dL (ref 0.0–0.3)
Total Bilirubin: 0.8 mg/dL (ref 0.2–1.2)
Total Protein: 6.9 g/dL (ref 6.0–8.3)

## 2017-01-12 LAB — LIPID PANEL
Cholesterol: 258 mg/dL — ABNORMAL HIGH (ref 0–200)
HDL: 41.2 mg/dL (ref >39.00)
Total CHOL/HDL Ratio: 6
Triglycerides: 627 mg/dL — ABNORMAL HIGH (ref 0.0–149.0)

## 2017-01-12 LAB — MICROALBUMIN / CREATININE URINE RATIO
Creatinine,U: 175.4 mg/dL
Microalb Creat Ratio: 1.6 mg/g (ref 0.0–30.0)
Microalb, Ur: 2.8 mg/dL — ABNORMAL HIGH (ref 0.0–1.9)

## 2017-01-12 LAB — CBC WITH DIFFERENTIAL/PLATELET
Basophils Absolute: 0.1 10*3/uL (ref 0.0–0.1)
Basophils Relative: 1 % (ref 0.0–3.0)
Eosinophils Absolute: 0.2 10*3/uL (ref 0.0–0.7)
Eosinophils Relative: 2.6 % (ref 0.0–5.0)
HCT: 43.8 % (ref 39.0–52.0)
Hemoglobin: 15.4 g/dL (ref 13.0–17.0)
Lymphocytes Relative: 23.9 % (ref 12.0–46.0)
Lymphs Abs: 1.4 10*3/uL (ref 0.7–4.0)
MCHC: 35.1 g/dL (ref 30.0–36.0)
MCV: 98.2 fl (ref 78.0–100.0)
Monocytes Absolute: 0.4 10*3/uL (ref 0.1–1.0)
Monocytes Relative: 7.4 % (ref 3.0–12.0)
Neutro Abs: 3.9 10*3/uL (ref 1.4–7.7)
Neutrophils Relative %: 65.1 % (ref 43.0–77.0)
Platelets: 216 10*3/uL (ref 150.0–400.0)
RBC: 4.47 Mil/uL (ref 4.22–5.81)
RDW: 13.3 % (ref 11.5–15.5)
WBC: 5.9 10*3/uL (ref 4.0–10.5)

## 2017-01-12 LAB — TSH: TSH: 1.46 u[IU]/mL (ref 0.35–4.50)

## 2017-01-12 LAB — BASIC METABOLIC PANEL
BUN: 16 mg/dL (ref 6–23)
CO2: 26 mEq/L (ref 19–32)
Calcium: 9.8 mg/dL (ref 8.4–10.5)
Chloride: 101 mEq/L (ref 96–112)
Creatinine, Ser: 1.02 mg/dL (ref 0.40–1.50)
GFR: 81.37 mL/min (ref >60.00)
Glucose, Bld: 272 mg/dL — ABNORMAL HIGH (ref 70–99)
Potassium: 4.7 mEq/L (ref 3.5–5.1)
Sodium: 138 mEq/L (ref 135–145)

## 2017-01-12 LAB — HEMOGLOBIN A1C: Hgb A1c MFr Bld: 9.1 % — ABNORMAL HIGH (ref 4.6–6.5)

## 2017-01-12 LAB — LDL CHOLESTEROL, DIRECT: Direct LDL: 133 mg/dL

## 2017-01-12 MED ORDER — GLIMEPIRIDE 2 MG PO TABS: 4.0000 mg | ORAL_TABLET | Freq: Every day | ORAL | 1 refills | Status: DC

## 2017-01-12 MED ORDER — LISINOPRIL 20 MG PO TABS: 20.0000 mg | ORAL_TABLET | Freq: Every day | ORAL | 1 refills | Status: DC

## 2017-01-12 NOTE — Assessment & Plan Note (Signed)
Declines ultrasound.  Discussed again with him today.  Discussed diet, exercise and weight loss.  Recheck liver panel today.

## 2017-01-12 NOTE — Progress Notes (Signed)
Pre-visit discussion using our clinic review tool. No additional management support is needed unless otherwise documented below in the visit note.  

## 2017-01-12 NOTE — Progress Notes (Signed)
[name]: Phillip Shepard, male   DOB: 08/02/1966, 51 y.o.   MRN: [id number]   Subjective:    [name]: Phillip Shepard, male    DOB: 05/06/1966, 51 y.o.   MRN: [id number]  HPI  Patient here for a scheduled follow up.  States he feels good.  Stays physically active.  No chest pain.  No sob.  No acid reflux.  No abdominal pain.  Bowels moving.  Still drinking alcohol.  Discussed the need to decrease the amount.  Reports not drinking as much.  Did not bring in any sugar readings.  States still averaging 200.  Did not see endocrinology.  Does plan to f/u.  Discussed low carb diet.     Past Medical History:  Diagnosis Date  . Allergy   . Diabetes mellitus without complication ([facility])   . Dysphagia    s/p esophageal dilatation  . GERD (gastroesophageal reflux disease)   . History of chickenpox   . History of kidney stones   . Hypercholesterolemia   . Hypertension   . Kidney stones 07/18/2013   Past Surgical History:  Procedure Laterality Date  . ESOPHAGEAL DILATION    . TONSILLECTOMY  1970   Family History  Problem Relation Age of Onset  . Hyperlipidemia Mother   . Hypertension Father   . Arthritis Maternal Grandmother   . Diabetes Maternal Grandmother   . Lung cancer Maternal Grandfather   . Heart disease Paternal Grandfather   . Stroke Paternal Grandfather    Social History   Social History  . Marital status: Married    Spouse name: Diane  . Number of children: 2  . Years of education: N/A   Occupational History  .  [employer]   Social History Main Topics  . Smoking status: Never Smoker  . Smokeless tobacco: Former NeurosurgeonUser    Types: Chew     Comment: [place]  . Alcohol use 0.0 oz/week     Comment: drinks 3-4 beers per day  . Drug use: No  . Sexual activity: Yes   Other Topics Concern  . None   Social History Narrative  . None    Outpatient Encounter Prescriptions as of 01/12/2017  Medication Sig  . glimepiride (AMARYL) 2 MG tablet Take 2  tablets (4 mg total) by mouth daily.  Marland Kitchen. JANUMET XR 50-1000 MG TB24 TAKE 2 TABLETS BY MOUTH EVERY DAY  . lisinopril (PRINIVIL,ZESTRIL) 20 MG tablet Take 1 tablet (20 mg total) by mouth daily.  Marland Kitchen. omeprazole (PRILOSEC) 20 MG capsule Take 20 mg by mouth daily.  . [DISCONTINUED] glimepiride (AMARYL) 2 MG tablet Take 2 tablets (4 mg total) by mouth daily.  . [DISCONTINUED] lisinopril (PRINIVIL,ZESTRIL) 20 MG tablet TAKE 1 TABLET (20 MG TOTAL) BY MOUTH DAILY.  . tadalafil (CIALIS) 5 MG tablet One tablet q 72 hours as directed. (Patient not taking: Reported on 01/12/2017)   No facility-administered encounter medications on file as of 01/12/2017.     Review of Systems  Constitutional: Negative for appetite change and unexpected weight change.  HENT: Negative for congestion and sinus pressure.   Respiratory: Negative for cough, chest tightness and shortness of breath.   Cardiovascular: Negative for chest pain, palpitations and leg swelling.  Gastrointestinal: Negative for abdominal pain, diarrhea, nausea and vomiting.  Genitourinary: Negative for difficulty urinating and dysuria.  Musculoskeletal: Negative for back pain and joint swelling.  Skin: Negative for color change and rash.  Neurological: Negative for dizziness, light-headedness and headaches.  Psychiatric/Behavioral: Negative  for agitation and dysphoric mood.       Objective:    Physical Exam  Constitutional: He appears well-developed and well-nourished. No distress.  HENT:  Nose: Nose normal.  Mouth/Throat: Oropharynx is clear and moist.  Neck: Neck supple. No thyromegaly present.  Cardiovascular: Normal rate and regular rhythm.   Pulmonary/Chest: Effort normal and breath sounds normal. No respiratory distress.  Abdominal: Soft. Bowel sounds are normal. There is no tenderness.  Musculoskeletal: He exhibits no edema or tenderness.  Lymphadenopathy:    He has no cervical adenopathy.  Skin: No rash noted. No erythema.  Psychiatric:  He has a normal mood and affect. His behavior is normal.    BP 130/64 (BP Location: Left Arm, Patient Position: Sitting, Cuff Size: Large)   Pulse 100   Temp 98.6 F (37 C) (Oral)   Resp 12   Ht 5\' 8"  (1.727 m)   Wt 199 lb 3.2 oz (90.4 kg)   SpO2 96%   BMI 30.29 kg/m  Wt Readings from Last 3 Encounters:  01/12/17 199 lb 3.2 oz (90.4 kg)  09/30/16 202 lb 3.2 oz (91.7 kg)  07/22/16 203 lb 9.6 oz (92.4 kg)     Lab Results  Component Value Date   WBC 5.9 01/12/2017   HGB 15.4 01/12/2017   HCT 43.8 01/12/2017   PLT 216.0 01/12/2017   GLUCOSE 272 (H) 01/12/2017   CHOL 258 (H) 01/12/2017   TRIG (H) 01/12/2017    627.0 Triglyceride is over 400; calculations on Lipids are invalid.   HDL 41.20 01/12/2017   LDLDIRECT 133.0 01/12/2017   LDLCALC 129 (H) 02/03/2014   ALT 142 (H) 01/12/2017   AST 108 (H) 01/12/2017   NA 138 01/12/2017   K 4.7 01/12/2017   CL 101 01/12/2017   CREATININE 1.02 01/12/2017   BUN 16 01/12/2017   CO2 26 01/12/2017   TSH 1.46 01/12/2017   PSA 0.18 12/22/2015   HGBA1C 9.1 (H) 01/12/2017   MICROALBUR 2.8 (H) 01/12/2017        Assessment & Plan:   Problem List Items Addressed This Visit    Abnormal liver function tests - Primary    Declines ultrasound.  Discussed again with him today.  Discussed diet, exercise and weight loss.  Recheck liver panel today.        Relevant Orders   Hepatic function panel (Completed)   Alcohol abuse    Discussed the need to decrease alcohol intake.  He has decreased some. Follow.       Diabetes (HCC)    Poorly controlled.  States taking medication regularly.  Agreed to endocrinology referral.  Does not watch his diet.  Discussed diet and exercise.  Discussed eye exam.       Relevant Medications   glimepiride (AMARYL) 2 MG tablet   lisinopril (PRINIVIL,ZESTRIL) 20 MG tablet   Other Relevant Orders   Hemoglobin A1c (Completed)   Basic metabolic panel (Completed)   Microalbumin / creatinine urine ratio  (Completed)   Essential hypertension, benign    Blood pressure under good control.  Continue same medication regimen.  Follow pressures.  Follow metabolic panel.        Relevant Medications   lisinopril (PRINIVIL,ZESTRIL) 20 MG tablet   Other Relevant Orders   CBC with Differential/Platelet (Completed)   TSH (Completed)   GERD (gastroesophageal reflux disease) (Chronic)    Controlled on omeprazole.        Hypercholesterolemia    Low cholesterol diet and exercise.  Follow lipid  panel.        Relevant Medications   lisinopril (PRINIVIL,ZESTRIL) 20 MG tablet   Other Relevant Orders   Lipid panel (Completed)   Obesity (BMI 30.0-34.9)    Diet and exercise.  Follow.       Relevant Medications   glimepiride (AMARYL) 2 MG tablet       [name] Homeworth, MD

## 2017-01-14 ENCOUNTER — Other Ambulatory Visit: Payer: Self-pay | Admitting: Internal Medicine

## 2017-01-14 ENCOUNTER — Encounter: Payer: Self-pay | Admitting: Internal Medicine

## 2017-01-14 DIAGNOSIS — R945 Abnormal results of liver function studies: Secondary | ICD-10-CM

## 2017-01-14 DIAGNOSIS — R7989 Other specified abnormal findings of blood chemistry: Secondary | ICD-10-CM

## 2017-01-14 MED ORDER — OMEGA-3-ACID ETHYL ESTERS 1 G PO CAPS: 1.0000 g | ORAL_CAPSULE | Freq: Three times a day (TID) | ORAL | 1 refills | Status: DC

## 2017-01-14 NOTE — Assessment & Plan Note (Signed)
Poorly controlled.  States taking medication regularly.  Agreed to endocrinology referral.  Does not watch his diet.  Discussed diet and exercise.  Discussed eye exam.

## 2017-01-14 NOTE — Assessment & Plan Note (Signed)
Low cholesterol diet and exercise.  Follow lipid panel.   

## 2017-01-14 NOTE — Assessment & Plan Note (Signed)
Discussed the need to decrease alcohol intake.  He has decreased some. Follow.

## 2017-01-14 NOTE — Assessment & Plan Note (Signed)
Blood pressure under good control.  Continue same medication regimen.  Follow pressures.  Follow metabolic panel.   

## 2017-01-14 NOTE — Progress Notes (Signed)
Ultrasound ordered.  rx sent in for lovaza.

## 2017-01-14 NOTE — Assessment & Plan Note (Signed)
Controlled on omeprazole.   

## 2017-01-14 NOTE — Assessment & Plan Note (Signed)
Diet and exercise.  Follow.  

## 2017-01-18 ENCOUNTER — Telehealth: Payer: Self-pay

## 2017-01-18 NOTE — Telephone Encounter (Signed)
PA for Lovaza completed on cover my meds

## 2017-01-23 NOTE — Telephone Encounter (Signed)
auth received  eff 01-18-17 to 01-18-18

## 2017-02-10 ENCOUNTER — Ambulatory Visit: Payer: BC Managed Care – PPO

## 2017-04-03 ENCOUNTER — Other Ambulatory Visit: Payer: Self-pay | Admitting: Internal Medicine

## 2017-04-21 ENCOUNTER — Other Ambulatory Visit: Payer: Self-pay | Admitting: Internal Medicine

## 2017-04-26 ENCOUNTER — Ambulatory Visit (INDEPENDENT_AMBULATORY_CARE_PROVIDER_SITE_OTHER): Payer: BC Managed Care – PPO | Admitting: Internal Medicine

## 2017-04-26 ENCOUNTER — Encounter: Payer: Self-pay | Admitting: Internal Medicine

## 2017-04-26 VITALS — BP 130/62 | HR 68 | Temp 98.5°F | Resp 12 | Ht 68.0 in | Wt 196.0 lb

## 2017-04-26 DIAGNOSIS — R7989 Other specified abnormal findings of blood chemistry: Secondary | ICD-10-CM

## 2017-04-26 DIAGNOSIS — E7800 Pure hypercholesterolemia, unspecified: Secondary | ICD-10-CM | POA: Diagnosis not present

## 2017-04-26 DIAGNOSIS — I10 Essential (primary) hypertension: Secondary | ICD-10-CM | POA: Diagnosis not present

## 2017-04-26 DIAGNOSIS — K219 Gastro-esophageal reflux disease without esophagitis: Secondary | ICD-10-CM

## 2017-04-26 DIAGNOSIS — R945 Abnormal results of liver function studies: Secondary | ICD-10-CM

## 2017-04-26 DIAGNOSIS — F1010 Alcohol abuse, uncomplicated: Secondary | ICD-10-CM | POA: Diagnosis not present

## 2017-04-26 DIAGNOSIS — Z125 Encounter for screening for malignant neoplasm of prostate: Secondary | ICD-10-CM

## 2017-04-26 DIAGNOSIS — E119 Type 2 diabetes mellitus without complications: Secondary | ICD-10-CM

## 2017-04-26 LAB — HEMOGLOBIN A1C: Hgb A1c MFr Bld: 10.2 % — ABNORMAL HIGH (ref 4.6–6.5)

## 2017-04-26 LAB — LIPID PANEL
Cholesterol: 262 mg/dL — ABNORMAL HIGH (ref 0–200)
HDL: 43.4 mg/dL (ref >39.00)
Total CHOL/HDL Ratio: 6
Triglycerides: 544 mg/dL — ABNORMAL HIGH (ref 0.0–149.0)

## 2017-04-26 LAB — HEPATIC FUNCTION PANEL
ALT: 119 U/L — ABNORMAL HIGH (ref 0–53)
AST: 96 U/L — ABNORMAL HIGH (ref 0–37)
Albumin: 4.5 g/dL (ref 3.5–5.2)
Alkaline Phosphatase: 66 U/L (ref 39–117)
Bilirubin, Direct: 0.2 mg/dL (ref 0.0–0.3)
Total Bilirubin: 0.7 mg/dL (ref 0.2–1.2)
Total Protein: 6.7 g/dL (ref 6.0–8.3)

## 2017-04-26 LAB — PSA: PSA: 0.22 ng/mL (ref 0.10–4.00)

## 2017-04-26 LAB — BASIC METABOLIC PANEL
BUN: 15 mg/dL (ref 6–23)
CO2: 24 mEq/L (ref 19–32)
Calcium: 10.3 mg/dL (ref 8.4–10.5)
Chloride: 99 mEq/L (ref 96–112)
Creatinine, Ser: 1.01 mg/dL (ref 0.40–1.50)
GFR: 82.21 mL/min (ref >60.00)
Glucose, Bld: 335 mg/dL — ABNORMAL HIGH (ref 70–99)
Potassium: 5.1 mEq/L (ref 3.5–5.1)
Sodium: 137 mEq/L (ref 135–145)

## 2017-04-26 LAB — LDL CHOLESTEROL, DIRECT: Direct LDL: 130 mg/dL

## 2017-04-26 MED ORDER — SITAGLIP PHOS-METFORMIN HCL ER 50-1000 MG PO TB24: 2.0000 | ORAL_TABLET | Freq: Every day | ORAL | 5 refills | Status: DC

## 2017-04-26 NOTE — Progress Notes (Signed)
[name]: Phillip Shepard, male   DOB: Nov 26, 1965, 51 y.o.   MRN: [id number]   Subjective:    [name]: Phillip Shepard, male    DOB: 01-15-1966, 51 y.o.   MRN: [id number]  HPI  Patient here for a scheduled follow up.  He reports he feels good. Not watching his diet.  Not checking sugars regularly.  Brought in no readings. States "they are about the same".  Stays physically active, but does not perform regular exercise.  No chest pain.  No sob.  No acid reflux.  No abdominal pain or cramping.  Bowels moving.  Never had abdominal ultrasound.  Did not get the endocrinology appt.  States was not called with appt.  Per note, did not return calls to make appt.  Discussed with him today.  He is ready to proceed with appt.  Discussed the need to get his sugars under better control.  Discussed importance of diet and exercise.  Discussed the need to decrease alcohol intake.     Past Medical History:  Diagnosis Date  . Allergy   . Diabetes mellitus without complication (HCC)   . Dysphagia    s/p esophageal dilatation  . GERD (gastroesophageal reflux disease)   . History of chickenpox   . History of kidney stones   . Hypercholesterolemia   . Hypertension   . Kidney stones 07/18/2013   Past Surgical History:  Procedure Laterality Date  . ESOPHAGEAL DILATION    . TONSILLECTOMY  1970   Family History  Problem Relation Age of Onset  . Hyperlipidemia Mother   . Hypertension Father   . Arthritis Maternal Grandmother   . Diabetes Maternal Grandmother   . Lung cancer Maternal Grandfather   . Heart disease Paternal Grandfather   . Stroke Paternal Grandfather    Social History   Social History  . Marital status: Married    Spouse name: Diane  . Number of children: 2  . Years of education: N/A   Occupational History  .  [employer]   Social History Main Topics  . Smoking status: Never Smoker  . Smokeless tobacco: Former Neurosurgeon    Types: Chew     Comment: [place]  . Alcohol  use 0.0 oz/week     Comment: drinks 3-4 beers per day  . Drug use: No  . Sexual activity: Yes   Other Topics Concern  . None   Social History Narrative  . None    Outpatient Encounter Prescriptions as of 04/26/2017  Medication Sig  . glimepiride (AMARYL) 2 MG tablet Take 2 tablets (4 mg total) by mouth daily.  Marland Kitchen lisinopril (PRINIVIL,ZESTRIL) 20 MG tablet Take 1 tablet (20 mg total) by mouth daily.  Marland Kitchen lisinopril (PRINIVIL,ZESTRIL) 20 MG tablet TAKE 1 TABLET (20 MG TOTAL) BY MOUTH DAILY.  Marland Kitchen omega-3 acid ethyl esters (LOVAZA) 1 g capsule TAKE 1 CAPSULE (1 G TOTAL) BY MOUTH 3 (THREE) TIMES DAILY.  Marland Kitchen omeprazole (PRILOSEC) 20 MG capsule Take 20 mg by mouth daily.  . SitaGLIPtin-MetFORMIN HCl (JANUMET XR) 50-1000 MG TB24 Take 2 tablets by mouth daily.  . tadalafil (CIALIS) 5 MG tablet One tablet q 72 hours as directed.  . [DISCONTINUED] JANUMET XR 50-1000 MG TB24 TAKE 2 TABLETS BY MOUTH EVERY DAY   No facility-administered encounter medications on file as of 04/26/2017.     Review of Systems  Constitutional: Negative for appetite change and unexpected weight change.  HENT: Negative for congestion and sinus pressure.   Respiratory: Negative  for cough, chest tightness and shortness of breath.   Cardiovascular: Negative for chest pain, palpitations and leg swelling.  Gastrointestinal: Negative for abdominal pain, diarrhea, nausea and vomiting.  Genitourinary: Negative for difficulty urinating and dysuria.  Musculoskeletal: Negative for back pain and joint swelling.  Skin: Negative for color change and rash.  Neurological: Negative for dizziness, light-headedness and headaches.  Psychiatric/Behavioral: Negative for agitation and dysphoric mood.       Objective:    Physical Exam  Constitutional: He appears well-developed and well-nourished. No distress.  HENT:  Nose: Nose normal.  Mouth/Throat: Oropharynx is clear and moist.  Neck: Neck supple. No thyromegaly present.  Cardiovascular:  Normal rate and regular rhythm.   Pulmonary/Chest: Effort normal and breath sounds normal. No respiratory distress.  Abdominal: Soft. Bowel sounds are normal. There is no tenderness.  Musculoskeletal: He exhibits no edema or tenderness.  Lymphadenopathy:    He has no cervical adenopathy.  Skin: No rash noted. No erythema.  Psychiatric: He has a normal mood and affect. His behavior is normal.    BP 130/62 (BP Location: Left Arm, Patient Position: Sitting, Cuff Size: Large)   Pulse 68   Temp 98.5 F (36.9 C) (Oral)   Resp 12   Ht 5\' 8"  (1.727 m)   Wt 196 lb (88.9 kg)   SpO2 98%   BMI 29.80 kg/m  Wt Readings from Last 3 Encounters:  04/26/17 196 lb (88.9 kg)  01/12/17 199 lb 3.2 oz (90.4 kg)  09/30/16 202 lb 3.2 oz (91.7 kg)     Lab Results  Component Value Date   WBC 5.9 01/12/2017   HGB 15.4 01/12/2017   HCT 43.8 01/12/2017   PLT 216.0 01/12/2017   GLUCOSE 335 (H) 04/26/2017   CHOL 262 (H) 04/26/2017   TRIG (H) 04/26/2017    544.0 Triglyceride is over 400; calculations on Lipids are invalid.   HDL 43.40 04/26/2017   LDLDIRECT 130.0 04/26/2017   LDLCALC 129 (H) 02/03/2014   ALT 119 (H) 04/26/2017   AST 96 (H) 04/26/2017   NA 137 04/26/2017   K 5.1 04/26/2017   CL 99 04/26/2017   CREATININE 1.01 04/26/2017   BUN 15 04/26/2017   CO2 24 04/26/2017   TSH 1.46 01/12/2017   PSA 0.22 04/26/2017   HGBA1C 10.2 (H) 04/26/2017   MICROALBUR 2.8 (H) 01/12/2017       Assessment & Plan:   Problem List Items Addressed This Visit    Abnormal liver function tests    Will schedule abdominal ultrasound.  Discussed the need to get sugars under better control.  Discussed diet, exercise and weight loss. Recheck liver panel.        Relevant Orders   Hepatic function panel (Completed)   US Abdomen Complete   Alcohol abuse    Discussed with him today.  Discussed the need to decrease amount of alcohol.  Discussed concerns regarding liver issues.        Diabetes (HCC) - Primary     Poorly controlled.  Discussed diet and exercise.  He does not watch what he eats.   Does not exercise regularly.  Discussed the need to get his sugars under better control.  Discussed complications of poorly controlled diabetes.  He is currently on janumet and amaryl.  Discussed other medications.  Discussed insulin.  He is hesitant to start insulin.  Discussed endocrinology referral.  He is agreeable to referral.  Will arrange.  Explained the need to keep appt with this referral.  Relevant Medications   SitaGLIPtin-MetFORMIN HCl (JANUMET XR) 50-1000 MG TB24   Other Relevant Orders   Hemoglobin A1c (Completed)   Basic metabolic panel (Completed)   Ambulatory referral to Endocrinology   Essential hypertension, benign    Blood pressure under good control.  Continue same medication regimen.  Follow pressures.  Follow metabolic panel.        GERD (gastroesophageal reflux disease) (Chronic)    Controlled on omeprazole.        Hypercholesterolemia    Discussed diet and exercise with him today.  Will hold on statin medication until we get his liver issue sorted out.  Follow lipid panel.       Relevant Orders   Lipid panel (Completed)    Other Visit Diagnoses    Prostate cancer screening       Relevant Orders   PSA (Completed)       [name] , MD

## 2017-04-27 ENCOUNTER — Other Ambulatory Visit: Payer: Self-pay | Admitting: Internal Medicine

## 2017-04-27 DIAGNOSIS — E875 Hyperkalemia: Secondary | ICD-10-CM

## 2017-04-27 NOTE — Progress Notes (Signed)
Order placed for f/u potassium check.  

## 2017-04-29 ENCOUNTER — Encounter: Payer: Self-pay | Admitting: Internal Medicine

## 2017-04-29 NOTE — Assessment & Plan Note (Signed)
Will schedule abdominal ultrasound.  Discussed the need to get sugars under better control.  Discussed diet, exercise and weight loss. Recheck liver panel.

## 2017-04-29 NOTE — Assessment & Plan Note (Signed)
Discussed with him today.  Discussed the need to decrease amount of alcohol.  Discussed concerns regarding liver issues.

## 2017-04-29 NOTE — Assessment & Plan Note (Signed)
Controlled on omeprazole.   

## 2017-04-29 NOTE — Assessment & Plan Note (Signed)
Discussed diet and exercise with him today.  Will hold on statin medication until we get his liver issue sorted out.  Follow lipid panel.

## 2017-04-29 NOTE — Assessment & Plan Note (Signed)
Poorly controlled.  Discussed diet and exercise.  He does not watch what he eats.   Does not exercise regularly.  Discussed the need to get his sugars under better control.  Discussed complications of poorly controlled diabetes.  He is currently on janumet and amaryl.  Discussed other medications.  Discussed insulin.  He is hesitant to start insulin.  Discussed endocrinology referral.  He is agreeable to referral.  Will arrange.  Explained the need to keep appt with this referral.

## 2017-04-29 NOTE — Assessment & Plan Note (Signed)
Blood pressure under good control.  Continue same medication regimen.  Follow pressures.  Follow metabolic panel.   

## 2017-05-03 ENCOUNTER — Other Ambulatory Visit (INDEPENDENT_AMBULATORY_CARE_PROVIDER_SITE_OTHER): Payer: BC Managed Care – PPO

## 2017-05-03 DIAGNOSIS — E875 Hyperkalemia: Secondary | ICD-10-CM | POA: Diagnosis not present

## 2017-05-03 LAB — POTASSIUM: Potassium: 4.3 mEq/L (ref 3.5–5.1)

## 2017-05-09 ENCOUNTER — Ambulatory Visit: Payer: BC Managed Care – PPO

## 2017-05-24 ENCOUNTER — Ambulatory Visit: Payer: BC Managed Care – PPO

## 2017-06-21 ENCOUNTER — Other Ambulatory Visit: Payer: Self-pay | Admitting: Internal Medicine

## 2017-09-05 ENCOUNTER — Ambulatory Visit (INDEPENDENT_AMBULATORY_CARE_PROVIDER_SITE_OTHER): Payer: BC Managed Care – PPO

## 2017-09-05 ENCOUNTER — Ambulatory Visit: Payer: BC Managed Care – PPO | Admitting: Internal Medicine

## 2017-09-05 ENCOUNTER — Encounter: Payer: Self-pay | Admitting: Internal Medicine

## 2017-09-05 DIAGNOSIS — E7800 Pure hypercholesterolemia, unspecified: Secondary | ICD-10-CM | POA: Diagnosis not present

## 2017-09-05 DIAGNOSIS — M25512 Pain in left shoulder: Secondary | ICD-10-CM | POA: Diagnosis not present

## 2017-09-05 DIAGNOSIS — E119 Type 2 diabetes mellitus without complications: Secondary | ICD-10-CM | POA: Diagnosis not present

## 2017-09-05 DIAGNOSIS — F1010 Alcohol abuse, uncomplicated: Secondary | ICD-10-CM

## 2017-09-05 DIAGNOSIS — R945 Abnormal results of liver function studies: Secondary | ICD-10-CM

## 2017-09-05 DIAGNOSIS — R7989 Other specified abnormal findings of blood chemistry: Secondary | ICD-10-CM

## 2017-09-05 DIAGNOSIS — I10 Essential (primary) hypertension: Secondary | ICD-10-CM | POA: Diagnosis not present

## 2017-09-05 DIAGNOSIS — K219 Gastro-esophageal reflux disease without esophagitis: Secondary | ICD-10-CM

## 2017-09-05 MED ORDER — OSELTAMIVIR PHOSPHATE 75 MG PO CAPS: 75.0000 mg | ORAL_CAPSULE | Freq: Every day | ORAL | 0 refills | Status: DC

## 2017-09-05 NOTE — Progress Notes (Signed)
Pre visit review using our clinic review tool, if applicable. No additional management support is needed unless otherwise documented below in the visit note. 

## 2017-09-05 NOTE — Progress Notes (Signed)
[name]: Phillip Shepard, male   DOB: [birth year], 52 y.o.   MRN: [id number]   Subjective:    [name]: Phillip Shepard, male    DOB: [birth year], 52 y.o.   MRN: [id number]  HPI  Patient here for a scheduled follow up.  He has been having problems with his left shoulder.  Increased pain.  Appears to flare intermittently.  Some increased issues lately.  Certain movements appear to aggravate.  Has been taking antiinflammatories and using a topical ointment - which helps some.  Using his hands and arms a lot.  No recent injury or fall.  He stays physically active, but does not do any type of formal exercise.  Not watching his diet.  Brings in no recorded sugars.  States still around 200.  He is taking his medication.  No chest pain.  No sob.  No acid reflux.  No abdominal pain.  Bowels moving.  Did not make the appt with endocrinology and did not keep his appt for f/u abdominal ultrasound.  Discussed with him today.  We discussed treatment options for his diabetes.     Past Medical History:  Diagnosis Date  . Allergy   . Diabetes mellitus without complication (HCC)   . Dysphagia    s/p esophageal dilatation  . GERD (gastroesophageal reflux disease)   . History of chickenpox   . History of kidney stones   . Hypercholesterolemia   . Hypertension   . Kidney stones 07/18/2013   Past Surgical History:  Procedure Laterality Date  . ESOPHAGEAL DILATION    . TONSILLECTOMY  1970   Family History  Problem Relation Age of Onset  . Hyperlipidemia Mother   . Hypertension Father   . Arthritis Maternal Grandmother   . Diabetes Maternal Grandmother   . Lung cancer Maternal Grandfather   . Heart disease Paternal Grandfather   . Stroke Paternal Grandfather    Social History   Socioeconomic History  . Marital status: Married    Spouse name: Diane  . Number of children: 2  . Years of education: None  . Highest education level: None  Social Needs  . Financial resource strain: None  .  Food insecurity - worry: None  . Food insecurity - inability: None  . Transportation needs - medical: None  . Transportation needs - non-medical: None  Occupational History    Employer:  [employer]  Tobacco Use  . Smoking status: Never Smoker  . Smokeless tobacco: Former Neurosurgeon    Types: Chew  . Tobacco comment: [place]  Substance and Sexual Activity  . Alcohol use: Yes    Alcohol/week: 0.0 oz    Comment: drinks 3-4 beers per day  . Drug use: No  . Sexual activity: Yes  Other Topics Concern  . None  Social History Narrative  . None    Outpatient Encounter Medications as of 09/05/2017  Medication Sig  . glimepiride (AMARYL) 2 MG tablet Take 2 tablets (4 mg total) by mouth daily.  Marland Kitchen lisinopril (PRINIVIL,ZESTRIL) 20 MG tablet Take 1 tablet (20 mg total) by mouth daily.  Marland Kitchen lisinopril (PRINIVIL,ZESTRIL) 20 MG tablet TAKE 1 TABLET (20 MG TOTAL) BY MOUTH DAILY.  Marland Kitchen omega-3 acid ethyl esters (LOVAZA) 1 g capsule TAKE 1 CAPSULE (1 G TOTAL) BY MOUTH 3 (THREE) TIMES DAILY.  Marland Kitchen omeprazole (PRILOSEC) 20 MG capsule Take 20 mg by mouth daily.  . SitaGLIPtin-MetFORMIN HCl (JANUMET XR) 50-1000 MG TB24 Take 2 tablets by mouth daily.  . tadalafil (CIALIS) 5  MG tablet One tablet q 72 hours as directed.  Marland Kitchen oseltamivir (TAMIFLU) 75 MG capsule Take 1 capsule (75 mg total) by mouth daily.   No facility-administered encounter medications on file as of 09/05/2017.     Review of Systems  Constitutional: Negative for appetite change.       Weight is up a few more pounds.    HENT: Negative for congestion and sinus pressure.   Respiratory: Negative for cough, chest tightness and shortness of breath.   Cardiovascular: Negative for chest pain, palpitations and leg swelling.  Gastrointestinal: Negative for abdominal pain, diarrhea, nausea and vomiting.  Genitourinary: Negative for difficulty urinating and dysuria.  Musculoskeletal: Negative for joint swelling and myalgias.       Left shoulder pain as  outlined.    Skin: Negative for color change and rash.  Neurological: Negative for dizziness, light-headedness and headaches.  Psychiatric/Behavioral: Negative for agitation and dysphoric mood.      Objective:    Physical Exam  Constitutional: He appears well-developed and well-nourished. No distress.  HENT:  Nose: Nose normal.  Mouth/Throat: Oropharynx is clear and moist.  Neck: Neck supple. No thyromegaly present.  Cardiovascular: Normal rate and regular rhythm.  Pulmonary/Chest: Effort normal and breath sounds normal. No respiratory distress.  Abdominal: Soft. Bowel sounds are normal. There is no tenderness.  Musculoskeletal: He exhibits no edema.  Lymphadenopathy:    He has no cervical adenopathy.  Skin: No rash noted. No erythema.  Psychiatric: He has a normal mood and affect. His behavior is normal.    BP 136/84   Pulse 90   Temp 98.4 F (36.9 C) (Oral)   Ht 5\' 8"  (1.727 m)   Wt 200 lb 6.4 oz (90.9 kg)   SpO2 98%   BMI 30.47 kg/m  Wt Readings from Last 3 Encounters:  09/05/17 200 lb 6.4 oz (90.9 kg)  04/26/17 196 lb (88.9 kg)  01/12/17 199 lb 3.2 oz (90.4 kg)     Lab Results  Component Value Date   WBC 5.9 01/12/2017   HGB 15.4 01/12/2017   HCT 43.8 01/12/2017   PLT 216.0 01/12/2017   GLUCOSE 335 (H) 04/26/2017   CHOL 262 (H) 04/26/2017   TRIG (H) 04/26/2017    544.0 Triglyceride is over 400; calculations on Lipids are invalid.   HDL 43.40 04/26/2017   LDLDIRECT 130.0 04/26/2017   LDLCALC 129 (H) 02/03/2014   ALT 119 (H) 04/26/2017   AST 96 (H) 04/26/2017   NA 137 04/26/2017   K 4.3 05/03/2017   CL 99 04/26/2017   CREATININE 1.01 04/26/2017   BUN 15 04/26/2017   CO2 24 04/26/2017   TSH 1.46 01/12/2017   PSA 0.22 04/26/2017   HGBA1C 10.2 (H) 04/26/2017   MICROALBUR 2.8 (H) 01/12/2017       Assessment & Plan:   Problem List Items Addressed This Visit    Abnormal liver function tests    Missed his abdominal ultrasound.  Discussed  rescheduling.  He will notify me when agreeable.  Discussed the need for diet, exercise and weight loss.  Discussed progression to cirrhosis.  Discussed the need to decrease alcohol intake.  Recheck liver panel.  Will notify me when agreeable for ultrasound        Alcohol abuse    Discussed the need to decrease his alcohol intake.  States he has cut down some.  Reports no binge drinking.  Follow.       Diabetes (HCC)    Sugar has been  poorly controlled.  Discussed with him today.  He does not watch his diet.  Does not do any type of formal exercise.  Discussed diet and exercise.  Discussed the need for decreased alcohol intake.  He is currently on janumet and amaryl.  Did not make the appt with endocrinology.  We have discussed treatment options.  See previous note.  After discussion, he is agreeable to see Devota Pacearoline Welles for diabetes education and medication adjustment.        Essential hypertension, benign    Blood pressure on recheck improved.  Continue same medication regimen.  Follow pressures.  Follow metabolic panel.        GERD (gastroesophageal reflux disease) (Chronic)    Controlled.  On omeprazole.       Hypercholesterolemia    Discussed the need for weight loss, diet and exercise.  Needs f/u regarding his liver.  Discussed with him today.  Discussed the need for decreased alcohol.  Will hold on statin medication until can get his liver issues sorted through.  Need to confirm if just fatty liver.        Left shoulder pain    Persistent.  Will check xray.       Relevant Orders   DG Shoulder Left (Completed)       [name] DurhamSCOTT, Roxane Puerto, MD

## 2017-09-07 ENCOUNTER — Encounter: Payer: Self-pay | Admitting: Internal Medicine

## 2017-09-07 NOTE — Assessment & Plan Note (Signed)
Discussed the need to decrease his alcohol intake.  States he has cut down some.  Reports no binge drinking.  Follow.

## 2017-09-07 NOTE — Assessment & Plan Note (Signed)
Discussed the need for weight loss, diet and exercise.  Needs f/u regarding his liver.  Discussed with him today.  Discussed the need for decreased alcohol.  Will hold on statin medication until can get his liver issues sorted through.  Need to confirm if just fatty liver.

## 2017-09-07 NOTE — Assessment & Plan Note (Signed)
Persistent.  Will check xray.

## 2017-09-07 NOTE — Assessment & Plan Note (Signed)
Controlled.  On omeprazole.   

## 2017-09-07 NOTE — Assessment & Plan Note (Signed)
Sugar has been poorly controlled.  Discussed with him today.  He does not watch his diet.  Does not do any type of formal exercise.  Discussed diet and exercise.  Discussed the need for decreased alcohol intake.  He is currently on janumet and amaryl.  Did not make the appt with endocrinology.  We have discussed treatment options.  See previous note.  After discussion, he is agreeable to see Devota Pacearoline Welles for diabetes education and medication adjustment.

## 2017-09-07 NOTE — Assessment & Plan Note (Signed)
Missed his abdominal ultrasound.  Discussed rescheduling.  He will notify me when agreeable.  Discussed the need for diet, exercise and weight loss.  Discussed progression to cirrhosis.  Discussed the need to decrease alcohol intake.  Recheck liver panel.  Will notify me when agreeable for ultrasound

## 2017-09-07 NOTE — Assessment & Plan Note (Signed)
Blood pressure on recheck improved.  Continue same medication regimen.  Follow pressures.  Follow metabolic panel.   

## 2017-09-11 ENCOUNTER — Telehealth: Payer: Self-pay | Admitting: Radiology

## 2017-09-11 NOTE — Telephone Encounter (Signed)
Pt coming in tomorrow for labs please place future orders. Thank you.  

## 2017-09-12 ENCOUNTER — Other Ambulatory Visit (INDEPENDENT_AMBULATORY_CARE_PROVIDER_SITE_OTHER): Payer: BC Managed Care – PPO

## 2017-09-12 ENCOUNTER — Other Ambulatory Visit: Payer: Self-pay | Admitting: Internal Medicine

## 2017-09-12 DIAGNOSIS — R7989 Other specified abnormal findings of blood chemistry: Secondary | ICD-10-CM

## 2017-09-12 DIAGNOSIS — E119 Type 2 diabetes mellitus without complications: Secondary | ICD-10-CM | POA: Diagnosis not present

## 2017-09-12 DIAGNOSIS — E7800 Pure hypercholesterolemia, unspecified: Secondary | ICD-10-CM | POA: Diagnosis not present

## 2017-09-12 DIAGNOSIS — R945 Abnormal results of liver function studies: Secondary | ICD-10-CM

## 2017-09-12 LAB — BASIC METABOLIC PANEL
BUN: 18 mg/dL (ref 6–23)
CO2: 28 mEq/L (ref 19–32)
Calcium: 9.8 mg/dL (ref 8.4–10.5)
Chloride: 100 mEq/L (ref 96–112)
Creatinine, Ser: 1.02 mg/dL (ref 0.40–1.50)
GFR: 81.16 mL/min (ref >60.00)
Glucose, Bld: 274 mg/dL — ABNORMAL HIGH (ref 70–99)
Potassium: 4.8 mEq/L (ref 3.5–5.1)
Sodium: 139 mEq/L (ref 135–145)

## 2017-09-12 LAB — LIPID PANEL
Cholesterol: 220 mg/dL — ABNORMAL HIGH (ref 0–200)
HDL: 51.8 mg/dL (ref >39.00)
NonHDL: 168.19
Total CHOL/HDL Ratio: 4
Triglycerides: 376 mg/dL — ABNORMAL HIGH (ref 0.0–149.0)
VLDL: 75.2 mg/dL — ABNORMAL HIGH (ref 0.0–40.0)

## 2017-09-12 LAB — HEPATIC FUNCTION PANEL
ALT: 105 U/L — ABNORMAL HIGH (ref 0–53)
AST: 62 U/L — ABNORMAL HIGH (ref 0–37)
Albumin: 4.5 g/dL (ref 3.5–5.2)
Alkaline Phosphatase: 56 U/L (ref 39–117)
Bilirubin, Direct: 0.2 mg/dL (ref 0.0–0.3)
Total Bilirubin: 0.8 mg/dL (ref 0.2–1.2)
Total Protein: 6.8 g/dL (ref 6.0–8.3)

## 2017-09-12 LAB — HEMOGLOBIN A1C: Hgb A1c MFr Bld: 9.8 % — ABNORMAL HIGH (ref 4.6–6.5)

## 2017-09-12 LAB — LDL CHOLESTEROL, DIRECT: Direct LDL: 121 mg/dL

## 2017-09-12 NOTE — Telephone Encounter (Signed)
Orders placed for labs

## 2017-09-12 NOTE — Progress Notes (Signed)
Orders placed for labs

## 2017-09-20 ENCOUNTER — Telehealth: Payer: Self-pay | Admitting: Internal Medicine

## 2017-09-20 NOTE — Telephone Encounter (Signed)
Patient notified of lab results and X-ray results. He would like to wait and discuss need for US and Ortho referral at his appointment 10/02/2017. He is aware to keep appointment and record his glucose levels during this period before that appointment.  (lab and x-ray not in basket)

## 2017-10-02 ENCOUNTER — Ambulatory Visit (INDEPENDENT_AMBULATORY_CARE_PROVIDER_SITE_OTHER): Payer: BC Managed Care – PPO | Admitting: Pharmacist

## 2017-10-02 ENCOUNTER — Encounter: Payer: Self-pay | Admitting: Pharmacist

## 2017-10-02 VITALS — BP 162/100 | Ht 68.0 in | Wt 203.8 lb

## 2017-10-02 DIAGNOSIS — E119 Type 2 diabetes mellitus without complications: Secondary | ICD-10-CM | POA: Diagnosis not present

## 2017-10-02 MED ORDER — EMPAGLIFLOZIN 10 MG PO TABS: 10.0000 mg | ORAL_TABLET | Freq: Every day | ORAL | 2 refills | Status: DC

## 2017-10-02 NOTE — Assessment & Plan Note (Signed)
 #  Hypertension longstanding currently uncontrolled.  Patient denies complete adherence with medication. Control is suboptimal due to fact that patient has not taken meds yet today, dietary sodium intake - Counseled on avoidance of salt/fried foods/lunch meat/canned and processed foods - Continue lisinopril for now - asked patient to take at bedtime to increase likelihood of "dipping" for improved day-time BP control.

## 2017-10-02 NOTE — Patient Instructions (Addendum)
Thanks for coming to see me! We are going to get on top of the diabetes from three directions - food, exercise, and medicine  1. STOP the glimepiride 2. START Jardiance 10 mg once a day in the morning. DO NOT TAKE this medicine if you are sick/throwing up/dehydrated and unable to eat 3. Continue the Janumet XR 1 pill twice a day. You MAY take these both at the same time.  4. Check your blood sugar once a day, either first thing in the morning or ~2 hours after you have had a meal. Bring in your meter for our next visit.    Goals:  -Walking 20 minutes twice a week. Continue hunting on Saturdays -[employer] for foods that are "bad" for you. Please limit fried foods, sweets (butterfingers, honey buns), sweet tea and mountain dew. Work on getting more vegetables.  -We want A1C to be less than 7! Perfect fasting blood sugar is between 80-130 mg/dL.  Come back to see me in 2-3 weeks.

## 2017-10-02 NOTE — Progress Notes (Signed)
S:     Chief Complaint  Patient presents with  . Medication Management    Diabetes    Patient arrives in good spirits, ambulating without assistance.  Presents for diabetes evaluation, education, and management at the request of Dr. Nicki Reaper (referred on 09/05/2017). Last seen by primary care provider on 09/05/2017 - at that time pt admitted to not following up with endocrinology referral and DM without change.  Dips 0.5-0.75 cans/day. Thoughts about quitting: "yeah! if they keep going up on price!". Patient self-identifies that his diet is "not good" and states, "90% of people that work for [employer] have the sugar, we don't do anything". States he is very averse to injectable therapy but has had to give himself insulin before with a pen.   Patient reports Diabetes was diagnosed 20 years ago with pre-diabetes, has been on glimepiride for ~5 years.   Family/Social History: grandfather, father with DM, no strokes/PCI/MI in family history; works for [employer].   Insurance coverage/medication affordability: has insurance through his wife - Nurse, mental health. States medications are affordable at this time.   Patient reports incomplete adherence with medications, gives himself a grade of B. "If I don't eat I don't take it". Denies taking meds yet this morning.  Current diabetes medications include: glimepiride 2 mg - 1 tab BID (prescribed 2 tabs daily), Janumet XR 50-1000 mg - 1 tab BID (prescribed 2 tabs daily) Current hypertension medications include: lisinopril 20 mg daily   Patient denies hypoglycemic events. Reports relative hypoglycemia in low 100s - "I have no energy"  Patient reported dietary habits: Eats 2-3 meals/day Breakfast: Skips breakfast most days/week; when he does eat he states "it puts me in the bathroom" Lunch: Hamburger with fries OR fried chicken OR brings a bologna or PB sandwich or beanie weanies + fruit cup + mini candy bar (butterfinger); eats out 2-3 times/week Dinner: last night rabbit,  squirrel, [place], catfish (Fried); eats game he hunts - typically grills meat Snacks: slim jims, pack of nabs, honey buns Drinks: water, sweet tea (75%), gatorade in summers, can of mountain dew 2-3 times/week  Patient reported exercise habits: no formal exercise, rabbit hunts on Saturdays, some exercise at work but "nothing strenuous".   Patient rates confidence at 5/10 that he can start walking again. Thinks he can try two days walking/week.     Patient reports nocturia. 1-2 times.  Patient denies pain/burning on urination.  Patient reports neuropathy. "feels like there are sandbags under them"  Patient denies visual changes. Patient reports self foot exams. No issues at present.   O:  Physical Exam  Constitutional: He appears well-developed and well-nourished.   Review of Systems  Eyes: Negative for blurred vision.  Genitourinary: Negative for dysuria, flank pain, frequency and urgency.  All other systems reviewed and are negative.   Lab Results  Component Value Date   HGBA1C 9.8 (H) 09/12/2017   Vitals:   10/02/17 0809  BP: (!) 162/100    Lipid Panel     Component Value Date/Time   CHOL 220 (H) 09/12/2017 0742   TRIG 376.0 (H) 09/12/2017 0742   HDL 51.80 09/12/2017 0742   CHOLHDL 4 09/12/2017 0742   VLDL 75.2 (H) 09/12/2017 0742   LDLCALC 129 (H) 02/03/2014 1103   LDLDIRECT 121.0 09/12/2017 0742   Hepatic Function Latest Ref Rng & Units 09/12/2017 04/26/2017 01/12/2017  Total Protein 6.0 - 8.3 g/dL 6.8 6.7 6.9  Albumin 3.5 - 5.2 g/dL 4.5 4.5 4.6  AST 0 - 37  U/L 62(H) 96(H) 108(H)  ALT 0 - 53 U/L 105(H) 119(H) 142(H)  Alk Phosphatase 39 - 117 U/L 56 66 57  Total Bilirubin 0.2 - 1.2 mg/dL 0.8 0.7 0.8  Bilirubin, Direct 0.0 - 0.3 mg/dL 0.2 0.2 0.2    ASCVD risk score = 16.3%  Home fasting CBG: Not checking - last checked ~2 weeks ago: 218 around 4PM. States, "My sugar runs from 200-300"  A/P: #Diabetes longstanding, currently uncontrolled . Patient denies  hypoglycemic events and is able to verbalize appropriate hypoglycemia management plan. Patient denies complete adherence with medication. Control is suboptimal due to dietary indiscretion, sedentary lifestyle, pancreatic insufficiency. - Started Jardiance 10 mg daily. Counseled on sick day rules.  - D/c glimepiride - I do not think this is contributing to glycemic control at this time given length of use - Continue Janumet XR 50-1000 mg BID - Extensive discussion had about dietary modification and exercise - Next A1C anticipated 12/11/2017.    #ASCVD risk - primary prevention in patient aged 65-75 with DM, baseline LDL 70-189, multiple ASCVD risk factors - moderate intensity statin indicated. Unfortunately, liver function limits [place] from being able to start statin at this time. Will follow up with this. Counseled on avoidance of EtOH, reduction of fried foods. Patient's ASCVD risk score >15% - indicated for aspirin for primary prevention. Will consider initiation at next visit.   #Hypertension longstanding currently uncontrolled.  Patient denies complete adherence with medication. Control is suboptimal due to fact that patient has not taken meds yet today, dietary sodium intake - Counseled on avoidance of salt/fried foods/lunch meat/canned and processed foods - Continue lisinopril for now - asked patient to take at bedtime to increase likelihood of "dipping" for improved day-time BP control.   Written patient instructions provided.  Total time in face to face counseling 45 minutes.    Follow up in Pharmacist Clinic Visit 2-3 weeks.  Carlean Jews, Pharm.D., BCPS, CPP PGY2 Ambulatory Care Pharmacy Resident Phone: [phone]

## 2017-10-02 NOTE — Assessment & Plan Note (Signed)
#  Diabetes longstanding, currently uncontrolled . Patient denies hypoglycemic events and is able to verbalize appropriate hypoglycemia management plan. Patient denies complete adherence with medication. Control is suboptimal due to dietary indiscretion, sedentary lifestyle, pancreatic insufficiency. - Started Jardiance 10 mg daily. Counseled on sick day rules.  - D/c glimepiride - I do not think this is contributing to glycemic control at this time given length of use - Continue Janumet XR 50-1000 mg BID - Extensive discussion had about dietary modification and exercise - Next A1C anticipated 12/11/2017.    #ASCVD risk - primary prevention in patient aged 54-75 with DM, baseline LDL 70-189, multiple ASCVD risk factors - moderate intensity statin indicated. Unfortunately, liver function limits us from being able to start statin at this time. Will follow up with this. Counseled on avoidance of EtOH, reduction of fried foods. Patient's ASCVD risk score >15% - indicated for aspirin for primary prevention. Will consider initiation at next visit.

## 2017-10-04 ENCOUNTER — Encounter: Payer: Self-pay | Admitting: *Deleted

## 2017-10-30 ENCOUNTER — Ambulatory Visit (INDEPENDENT_AMBULATORY_CARE_PROVIDER_SITE_OTHER): Payer: BC Managed Care – PPO | Admitting: Pharmacist

## 2017-10-30 ENCOUNTER — Encounter: Payer: Self-pay | Admitting: Pharmacist

## 2017-10-30 VITALS — BP 137/82 | HR 92 | Ht 68.0 in | Wt 195.0 lb

## 2017-10-30 DIAGNOSIS — E119 Type 2 diabetes mellitus without complications: Secondary | ICD-10-CM | POA: Diagnosis not present

## 2017-10-30 MED ORDER — ASPIRIN 81 MG PO CHEW: 81.0000 mg | CHEWABLE_TABLET | Freq: Every day | ORAL | 0 refills | Status: DC

## 2017-10-30 MED ORDER — EMPAGLIFLOZIN 25 MG PO TABS: 25.0000 mg | ORAL_TABLET | Freq: Every day | ORAL | 3 refills | Status: DC

## 2017-10-30 NOTE — Patient Instructions (Signed)
Thanks for coming to see me today!   1 Increase your Jardiance to 25 mg daily once you finish out the 10 mg tablets. Do not take this medicine if you are sick/dehydrated/throwing up.   2 start aspirin 81 mg daily  Come back to see me in 3-4 weeks.   Good job making improvements to your diet!!! Keep working on portion control for your carbohydrates (fries, tortillas, fruits, crackers, breads).

## 2017-10-30 NOTE — Progress Notes (Addendum)
S:     Chief Complaint  Patient presents with  . Medication Management    diabetes    Patient arrives in good spirits, ambulating without assistance.  Presents for diabetes evaluation, education, and management at the request of Dr. Lorin PicketScott (referred on 09/05/2017). Last seen by primary care provider on 09/05/2017 - at that time pt admitted to not following up with endocrinology referral and DM without change. Last Rx Clinic visit on 10/02/17 - at that time Jardiance was started and glimepiride was discontinued. Lisinopril was changed to qHS dosing.   Today, patient reports sugars have improved, running 200s-250s generally. Has d/c'd glimepiride and started jardiance. Reports he notices increased urination but not bothersome, denies pain burning upon urination. Drinking hardly any sodas, no sweet tea. Has not been exercising. Still dipping, no thoughts on qutting at this time. Reports he has an accountability partner at work who also has DM - they have been working on diet.   Patient reports Diabetes was diagnosed 20 years ago with pre-diabetes, has been on glimepiride for ~5 years.   Family/Social History: grandfather, father with DM, no strokes/PCI/MI in family history; works for [employer].   Insurance coverage/medication affordability: has insurance through his wife - [employer]up[employer]urist[employer]BS. States medications are affordable at this time.   Patient reports adherence with medications.  Current diabetes medications include: Jardiance 10 mg daily, Janumet 50-1000 mg BID Current hypertension medications include: lisinopril 20 mg daily  Patient denies hypoglycemic events.  Patient reported dietary habits: Eats 2-3 meals/day Breakfast: pack of nabs Lunch: fast food  Dinner: [place] food - seafood combination + 3 tortillas Snacks: Drinks:water, lemonade occasionally  Patient reported exercise habits: none   O:  Physical Exam  Constitutional: He appears well-developed and well-nourished.     Review of  Systems  All other systems reviewed and are negative.    Lab Results  Component Value Date   HGBA1C 9.8 (H) 09/12/2017   Vitals:   10/30/17 0809  BP: 137/82  Pulse: 92    Lipid Panel     Component Value Date/Time   CHOL 220 (H) 09/12/2017 0742   TRIG 376.0 (H) 09/12/2017 0742   HDL 51.80 09/12/2017 0742   CHOLHDL 4 09/12/2017 0742   VLDL 75.2 (H) 09/12/2017 0742   LDLCALC 129 (H) 02/03/2014 1103   LDLDIRECT 121.0 09/12/2017 0742    Home fasting CBG: low 200s 2 hour post-prandial/random CBG: 182 lowest, 250s, highest 350.   ASCVD risk score = 16.3%  A/P: #Diabetes longstanding currently with improved control as evidenced by CBGs, however remains uncontrolled. Patient denies hypoglycemic events and is able to verbalize appropriate hypoglycemia management plan. Patient reports adherence with medication. Control is suboptimal due to dietary indiscretion, sedentary lifestyle, pancreatic insufficiency. Tolerating addition of Jardiance well.  - Increase Jardiance to 25 mg daily. Counseled on sick day rules.  - Continue Janumet XR 50-1000 mg BID - Next A1C anticipated 12/11/2017 or later.    #ASCVD risk - primary prevention in patient aged 54-75 with DM, baseline LDL 70-189, multiple ASCVD risk factors - moderate intensity statin indicated. Unfortunately, liver function limits us from being able to start statin at this time. Will follow up with this. Counseled on avoidance of EtOH, reduction of fried foods. Patient's ASCVD risk score >15% - indicated for aspirin for primary prevention.  -Started aspirin 81 mg daily  #Hypertension longstanding currently with improved control with move from AM to PM dosing for lisinopril, weight loss.  - Counseled on avoidance of  salt/fried foods/lunch meat/canned and processed foods - Continue lisinopril 20 mg daily for now.   Written patient instructions provided.  Total time in face to face counseling 30 minutes.    Follow up in Pharmacist  Clinic Visit 3-4 weeks.    Allena Katz, Pharm.D., BCPS, CPP PGY2 Ambulatory Care Pharmacy Resident Phone: [phone]   Reviewed above information.  Agree with assessment and plan.    Dr Lorin Picket

## 2017-10-30 NOTE — Assessment & Plan Note (Signed)
#  Diabetes longstanding currently with improved control as evidenced by CBGs, however remains uncontrolled. Patient denies hypoglycemic events and is able to verbalize appropriate hypoglycemia management plan. Patient reports adherence with medication. Control is suboptimal due to dietary indiscretion, sedentary lifestyle, pancreatic insufficiency. Tolerating addition of Jardiance well.  - Increase Jardiance to 25 mg daily. Counseled on sick day rules.  - Continue Janumet XR 50-1000 mg BID - Next A1C anticipated 12/11/2017 or later.    #ASCVD risk - primary prevention in patient aged 54-75 with DM, baseline LDL 70-189, multiple ASCVD risk factors - moderate intensity statin indicated. Unfortunately, liver function limits us from being able to start statin at this time. Will follow up with this. Counseled on avoidance of EtOH, reduction of fried foods. Patient's ASCVD risk score >15% - indicated for aspirin for primary prevention.  -Started aspirin 81 mg daily

## 2017-10-30 NOTE — Assessment & Plan Note (Signed)
#  Hypertension longstanding currently with improved control with move from AM to PM dosing for lisinopril, weight loss.  - Counseled on avoidance of salt/fried foods/lunch meat/canned and processed foods - Continue lisinopril 20 mg daily for now.

## 2017-12-04 ENCOUNTER — Ambulatory Visit (INDEPENDENT_AMBULATORY_CARE_PROVIDER_SITE_OTHER): Payer: BC Managed Care – PPO | Admitting: Pharmacist

## 2017-12-04 ENCOUNTER — Encounter: Payer: Self-pay | Admitting: Pharmacist

## 2017-12-04 VITALS — BP 117/82 | HR 97 | Ht 68.0 in | Wt 192.0 lb

## 2017-12-04 DIAGNOSIS — E119 Type 2 diabetes mellitus without complications: Secondary | ICD-10-CM

## 2017-12-04 LAB — POCT GLYCOSYLATED HEMOGLOBIN (HGB A1C): Hemoglobin A1C: 9.6

## 2017-12-04 MED ORDER — METFORMIN HCL ER 500 MG PO TB24: 2000.0000 mg | ORAL_TABLET | Freq: Every day | ORAL | 6 refills | Status: DC

## 2017-12-04 MED ORDER — SEMAGLUTIDE(0.25 OR 0.5MG/DOS) 2 MG/1.5ML ~~LOC~~ SOPN: 0.2500 mg | PEN_INJECTOR | SUBCUTANEOUS | 3 refills | Status: DC

## 2017-12-04 NOTE — Patient Instructions (Signed)
Thanks for coming to see me! Your A1C is marginally better but we still have a ways to go.   1. Finish out the Janumet XR, then STOP 2. START Metformin XR 2000 mg (4 tablets) once a day 3. START Ozempic 0.25 mg under the skin x4 weeks, then increase to 0.5 mg under the skin weekly  Come back to see me in 6-8 weeks.

## 2017-12-04 NOTE — Assessment & Plan Note (Signed)
#  Diabetes longstanding currently with slightly improved control as evidenced by A1C, however remains uncontrolled as evidenced by A1C today. Patient denies hypoglycemic events and is able to verbalize appropriate hypoglycemia management plan. Patient reports adherence with medication. Control is suboptimal due to dietary indiscretion, sedentary lifestyle, pancreatic insufficiency. Tolerating increased  Jardiance well.  - D/c Janumet XR - Start Metformin XR 2000 mg daily - Start Ozempic 0.25 mg subq weekly x4 weeks, then increase to 0.5 mg weekly. Patient educated on purpose, proper use and potential adverse effects of n/v.  Following instruction patient verbalized understanding of treatment plan.  - Continue Jardiance 25 mg daily. Counseled on sick day rules.  - A1C today.

## 2017-12-04 NOTE — Progress Notes (Addendum)
    S:     Chief Complaint  Patient presents with  . Medication Management    Diabetes    Patient arrives in fair spirits, .  Presents for diabetes evaluation, education, and management at the request of Dr. Lorin PicketScott (referred on 09/05/17). Last seen by primary care provider on 09/05/17. Last Rx Clinic visit on 10/30/2017 - at that time Jardiance was increased. Today, patient reports he's tired from working overtime but denies issues with increased Jardiance (no dizziness, s/sx yeast infxn/UTI). Verbalizes understanding of sick day rules. Reports he has been trying to improve diet by eating more salads. Reports he doesn't like needles but is amenable to injectable therapy. Reports he just picked up a 1 month supply of Janumet XR.  Patient reports adherence with medications.  Current diabetes medications include: Jardiance 25 mg daily, Janumet XR 50-1000 mg BID Current hypertension medications include: lisinopril 20 mg daily  Patient denies hypoglycemic events.   Patient reports nocturia. Once nightly.  Patient denies pain/burning on urination.  Patient reports neuropathy. "feels like sand on the bottom of my feet" Patient denies visual changes. Patient denies self foot exams. Reports no issues.   O:  Physical Exam  Constitutional: He appears well-developed and well-nourished.     Review of Systems  All other systems reviewed and are negative.    Lab Results  Component Value Date   HGBA1C 9.6 12/04/2017   Vitals:   12/04/17 0806  BP: 117/82  Pulse: 97    Lipid Panel     Component Value Date/Time   CHOL 220 (H) 09/12/2017 0742   TRIG 376.0 (H) 09/12/2017 0742   HDL 51.80 09/12/2017 0742   CHOLHDL 4 09/12/2017 0742   VLDL 75.2 (H) 09/12/2017 0742   LDLCALC 129 (H) 02/03/2014 1103   LDLDIRECT 121.0 09/12/2017 0742    Home fasting CBG: 200s 2 hour post-prandial/random CBG: 170s-180s.  Denies personal and family h/o medullary thyroid carcinoma or MEN2 cancer.    ASCVD risk score = 16.3%  A/P: #Diabetes longstanding currently with slightly improved control as evidenced by A1C, however remains uncontrolled as evidenced by A1C today. Patient denies hypoglycemic events and is able to verbalize appropriate hypoglycemia management plan. Patient reports adherence with medication. Control is suboptimal due to dietary indiscretion, sedentary lifestyle, pancreatic insufficiency. Tolerating increased  Jardiance well.  - D/c Janumet XR - Start Metformin XR 2000 mg daily - Start Ozempic 0.25 mg subq weekly x4 weeks, then increase to 0.5 mg weekly. Patient educated on purpose, proper use and potential adverse effects of n/v.  Following instruction patient verbalized understanding of treatment plan.  - Continue Jardiance 25 mg daily. Counseled on sick day rules.  - A1C today.   #ASCVD risk - primary prevention in patient aged 54-75 with DM, baseline LDL 70-189, multiple ASCVD risk factors - moderate intensity statin indicated.Unfortunately, liver function limits us from being able to start statin at this time. Patient's ASCVD risk score >15% - indicated for aspirin for primary prevention, continue aspirin 81 mg daily.  #Hypertensionlongstandingcurrentlywith improved control with move from AM to PM dosing for lisinopril, weight loss.  - Continue lisinopril 20 mg daily.  Written patient instructions provided.  Total time in face to face counseling 30 minutes.    Follow up in Pharmacist Clinic Visit 6-8 weeks.   Allena Katzaroline E Welles, Pharm.D., BCPS, CPP PGY2 Ambulatory Care Pharmacy Resident Phone: [phone]   Reviewed above information.  Agree with assessment and plan.    Dr Lorin PicketScott

## 2017-12-04 NOTE — Assessment & Plan Note (Signed)
#  ASCVD risk - primary prevention in patient aged 54-75 with DM, baseline LDL 70-189, multiple ASCVD risk factors - moderate intensity statin indicated.Unfortunately, liver function limits us from being able to start statin at this time. Patient's ASCVD risk score >15% - indicated for aspirin for primary prevention, continue aspirin 81 mg daily.  #Hypertensionlongstandingcurrentlywith improved control with move from AM to PM dosing for lisinopril, weight loss.  - Continue lisinopril 20 mg daily.

## 2017-12-13 ENCOUNTER — Other Ambulatory Visit: Payer: Self-pay | Admitting: Internal Medicine

## 2017-12-13 ENCOUNTER — Encounter: Payer: BC Managed Care – PPO | Admitting: Internal Medicine

## 2017-12-13 DIAGNOSIS — Z2089 Contact with and (suspected) exposure to other communicable diseases: Secondary | ICD-10-CM

## 2017-12-28 ENCOUNTER — Other Ambulatory Visit: Payer: Self-pay | Admitting: Internal Medicine

## 2017-12-29 ENCOUNTER — Other Ambulatory Visit: Payer: Self-pay | Admitting: Internal Medicine

## 2017-12-29 NOTE — Telephone Encounter (Signed)
From caroline note looks like this was DC on 4/19

## 2017-12-29 NOTE — Telephone Encounter (Signed)
Please clarify with pt if taking.

## 2017-12-29 NOTE — Telephone Encounter (Signed)
Could not reach patient left message to call office.

## 2018-01-22 ENCOUNTER — Ambulatory Visit (INDEPENDENT_AMBULATORY_CARE_PROVIDER_SITE_OTHER): Payer: BC Managed Care – PPO | Admitting: Pharmacist

## 2018-01-22 ENCOUNTER — Encounter: Payer: Self-pay | Admitting: Pharmacist

## 2018-01-22 VITALS — BP 127/83 | HR 90 | Ht 68.0 in | Wt 187.0 lb

## 2018-01-22 DIAGNOSIS — E119 Type 2 diabetes mellitus without complications: Secondary | ICD-10-CM

## 2018-01-22 NOTE — Assessment & Plan Note (Signed)
#  Diabetes longstanding currently with seemingly improved control however hard to determine due to lack of objective data. Patient denies hypoglycemic events and is able to verbalize appropriate hypoglycemia management plan. Patient reports adherence with medication. Control is suboptimal due to dietary indiscretion, pancreatic insufficiency. - Continue ozempic 0.25 mg weekly x1 month total, then increase to 0.5 mg weekly - Continue jardiance 25 mg daily and metformin XR 1000 mg BID - Counseled on sick day rules for Jardiance - Next A1C anticipated 03/05/18 or later

## 2018-01-22 NOTE — Progress Notes (Signed)
    S:     Chief Complaint  Patient presents with  . Medication Management    Diabetes    Patient arrives in good spirits, ambulating without assistance.  Presents for diabetes evaluation, education, and management at the request of Dr. Lorin PicketScott (referred on 09/05/17). Last seen by primary care provider on 09/05/17. Last Rx Clinic visit on 12/04/17 - at that time A1C was found to be improved however still uncontrolled, ozempic was started and janumet was changed to metformin XR.   Today, patient reports he started ozempic, d/c'd janumet and switched to metformin last Sunday as he wanted to finish out his supply of janument. Reports he has been checking CBGs twice a week. Lowest = 136 (evening), highest = 200 (fasting). Denies n/v, reports early satiety. Denies pain/burning on urination.   Insurance coverage/medication affordability: BCBS commercial - no issues with affordability.   Patient reports adherence with medications.  Current diabetes medications include: Ozempic 0.25 mg weekly, jardiance 25 mg daily, metformin XR 1000 mg BID Current hypertension medications include: lisinopril 20 mg daily  Patient denies hypoglycemic events.  Patient reported dietary habits: Eats 2 meals/day Breakfast:skipped Lunch:skipped on the weekends, when working PB and banana or ham sandwich on wheat bread Dinner:Mexican - seafood combo (shrimp, crab, tortillas), chips and salsa  Snacks:nutty bar Drinks: diet sodas mostly  Patient reported exercise habits: physical strenuous job, yard work   Patient reports nocturia. 1-2 times nightly.  Patient denies pain/burning on urination.  Patient denies neuropathy. Patient denies visual changes. Patient reports self foot exams.  O:  Physical Exam  Constitutional: He appears well-developed and well-nourished.     Review of Systems  Gastrointestinal: Negative for diarrhea, nausea and vomiting.  Genitourinary: Negative for dysuria, frequency and urgency.      Lab Results  Component Value Date   HGBA1C 9.6 12/04/2017   Vitals:   01/22/18 0903  BP: 127/83  Pulse: 90    Lipid Panel     Component Value Date/Time   CHOL 220 (H) 09/12/2017 0742   TRIG 376.0 (H) 09/12/2017 0742   HDL 51.80 09/12/2017 0742   CHOLHDL 4 09/12/2017 0742   VLDL 75.2 (H) 09/12/2017 0742   LDLCALC 129 (H) 02/03/2014 1103   LDLDIRECT 121.0 09/12/2017 0742    A/P: #Diabetes longstanding currently with seemingly improved control however hard to determine due to lack of objective data. Patient denies hypoglycemic events and is able to verbalize appropriate hypoglycemia management plan. Patient reports adherence with medication. Control is suboptimal due to dietary indiscretion, pancreatic insufficiency. - Continue ozempic 0.25 mg weekly x1 month total, then increase to 0.5 mg weekly - Continue jardiance 25 mg daily and metformin XR 1000 mg BID - Counseled on sick day rules for Jardiance - Next A1C anticipated 03/05/18 or later  #ASCVD risk - primary prevention in patient aged 54-75 with DM, baseline LDL 70-189, multiple ASCVD risk factors - moderate intensity statin indicated.Unfortunately, liver function limits us from being able to start statin at this time. Patient's ASCVD risk score >15% - indicated for aspirin for primary prevention, continue aspirin 81 mg daily.  #Hypertensionlongstandingcurrentlywith improved control after move from AM to PM dosing for lisinopril, weight loss. - Continue lisinopril20 mg daily at bedtime. Written patient instructions provided.  Total time in face to face counseling 20 minutes.    Follow up with primary care provider in 4-6 weeks.   Allena Katzaroline E Welles, Pharm.D., BCPS, CPP PGY2 Ambulatory Care Pharmacy Resident Phone: [phone]

## 2018-01-22 NOTE — Assessment & Plan Note (Signed)
#  ASCVD risk - primary prevention in patient aged 54-75 with DM, baseline LDL 70-189, multiple ASCVD risk factors - moderate intensity statin indicated.Unfortunately, liver function limits us from being able to start statin at this time. Patient's ASCVD risk score >15% - indicated for aspirin for primary prevention, continue aspirin 81 mg daily.  #Hypertensionlongstandingcurrentlywith improved control after move from AM to PM dosing for lisinopril, weight loss. - Continue lisinopril20 mg daily at bedtime.

## 2018-01-22 NOTE — Patient Instructions (Signed)
Great to see you! I am encouraged by the blood sugar numbers that we do know. My goal is for you to live in the "100s".  Continue your Ozempic at 0.25 mg once a week for a total of four weeks, then increase to 0.5 mg weekly. Schedule your next follow up with Dr. Lorin PicketScott. You will be due for another A1C on 03/05/18 or later.  Please call if you are having blood sugars in the 200s again. Otherwise, I think you're doing much better! You are welcome to see pharmacy clinic again in the future if your diabetes gets out of control again.

## 2018-01-29 ENCOUNTER — Ambulatory Visit: Payer: BC Managed Care – PPO | Admitting: Pharmacist

## 2018-02-26 ENCOUNTER — Other Ambulatory Visit: Payer: Self-pay | Admitting: Internal Medicine

## 2018-02-28 ENCOUNTER — Other Ambulatory Visit: Payer: Self-pay | Admitting: Internal Medicine

## 2018-04-20 ENCOUNTER — Ambulatory Visit: Payer: BC Managed Care – PPO | Admitting: Internal Medicine

## 2018-04-20 ENCOUNTER — Encounter: Payer: Self-pay | Admitting: Internal Medicine

## 2018-04-20 DIAGNOSIS — E119 Type 2 diabetes mellitus without complications: Secondary | ICD-10-CM | POA: Diagnosis not present

## 2018-04-20 DIAGNOSIS — R7989 Other specified abnormal findings of blood chemistry: Secondary | ICD-10-CM

## 2018-04-20 DIAGNOSIS — R945 Abnormal results of liver function studies: Secondary | ICD-10-CM

## 2018-04-20 DIAGNOSIS — E7800 Pure hypercholesterolemia, unspecified: Secondary | ICD-10-CM

## 2018-04-20 DIAGNOSIS — F1010 Alcohol abuse, uncomplicated: Secondary | ICD-10-CM

## 2018-04-20 DIAGNOSIS — I10 Essential (primary) hypertension: Secondary | ICD-10-CM

## 2018-04-20 DIAGNOSIS — K219 Gastro-esophageal reflux disease without esophagitis: Secondary | ICD-10-CM

## 2018-04-20 MED ORDER — METFORMIN HCL ER 500 MG PO TB24: ORAL_TABLET | ORAL | 3 refills | Status: DC

## 2018-04-20 NOTE — Progress Notes (Signed)
[name]: Phillip Shepard, male   DOB: 09/13/1965, 52 y.o.   MRN: [id number]   Subjective:    [name]: Phillip Shepard, male    DOB: 15-Aug-1966, 52 y.o.   MRN: [id number]  HPI  Patient here for a scheduled follow up.  He has adjusted his diet.  Has lost weight.  Stays physically active.  Does not do formal exercise.  No chest pain.  No sob.  No acid reflux.  No abdominal pain.  Bowels moving.  No urine change.  Taking jardiance and on ozempic.  Not taking metformin.  States unable to swallow the large pills.  Has not problems eating or swallowing food or any other tablets.  No acid reflux.  No abdominal pain.  Bowels moving.  Discussed alcohol intake.  Still drinking.  Discussed the need to stop drinking.  Declines to have abdominal ultrasound.  Discussed abnormal liver function tests.  States blood sugars ranging now between 160-250.  Was better on metformin.     Past Medical History:  Diagnosis Date  . Allergy   . Diabetes mellitus without complication ([place])   . Dysphagia    s/p esophageal dilatation  . GERD (gastroesophageal reflux disease)   . History of chickenpox   . History of kidney stones   . Hypercholesterolemia   . Hypertension   . Kidney stones 07/18/2013   Past Surgical History:  Procedure Laterality Date  . ESOPHAGEAL DILATION    . TONSILLECTOMY  1970   Family History  Problem Relation Age of Onset  . Hyperlipidemia Mother   . Hypertension Father   . Arthritis Maternal Grandmother   . Diabetes Maternal Grandmother   . Lung cancer Maternal Grandfather   . Heart disease Paternal Grandfather   . Stroke Paternal Grandfather    Social History   Socioeconomic History  . Marital status: Married    Spouse name: Diane  . Number of children: 2  . Years of education: Not on file  . Highest education level: Not on file  Occupational History    Employer: [employer]  Social Needs  . Financial resource strain: Not on file  . Food insecurity:    Worry:  Not on file    Inability: Not on file  . Transportation needs:    Medical: Not on file    Non-medical: Not on file  Tobacco Use  . Smoking status: Never Smoker  . Smokeless tobacco: Former Systems developer    Types: Chew  . Tobacco comment: [place]  Substance and Sexual Activity  . Alcohol use: Yes    Alcohol/week: 0.0 standard drinks    Comment: drinks 3-4 beers per day  . Drug use: No  . Sexual activity: Yes  Lifestyle  . Physical activity:    Days per week: Not on file    Minutes per session: Not on file  . Stress: Not on file  Relationships  . Social connections:    Talks on phone: Not on file    Gets together: Not on file    Attends religious service: Not on file    Active member of club or organization: Not on file    Attends meetings of clubs or organizations: Not on file    Relationship status: Not on file  Other Topics Concern  . Not on file  Social History Narrative  . Not on file    Outpatient Encounter Medications as of 04/20/2018  Medication Sig  . aspirin (ASPIRIN CHILDRENS) 81 MG chewable tablet Chew  1 tablet (81 mg total) by mouth daily.  Marland Kitchen JARDIANCE 25 MG TABS tablet TAKE 1 TABLET BY MOUTH DAILY.  Marland Kitchen lisinopril (PRINIVIL,ZESTRIL) 20 MG tablet TAKE 1 TABLET (20 MG TOTAL) BY MOUTH DAILY.  Marland Kitchen omeprazole (PRILOSEC) 20 MG capsule Take 20 mg by mouth daily.  . Semaglutide (OZEMPIC) 0.25 or 0.5 MG/DOSE SOPN Inject 0.25 mg into the skin once a week. After 4 weeks, increase to 0.5 mg once weekly.  . [DISCONTINUED] metFORMIN (GLUCOPHAGE XR) 500 MG 24 hr tablet Take 4 tablets (2,000 mg total) by mouth daily with breakfast. (Patient taking differently: Take 1,000 mg by mouth 2 (two) times daily. )  . [DISCONTINUED] omega-3 acid ethyl esters (LOVAZA) 1 g capsule TAKE 1 CAPSULE BY MOUTH THREE TIMES A DAY  . metFORMIN (GLUCOPHAGE XR) 500 MG 24 hr tablet Take 2 tablets bid.  Needs the elongated form of the pill (instead of the round)   No facility-administered encounter medications  on file as of 04/20/2018.     Review of Systems  Constitutional: Negative for appetite change and unexpected weight change.  HENT: Negative for congestion and sinus pressure.   Respiratory: Negative for cough, chest tightness and shortness of breath.   Cardiovascular: Negative for chest pain, palpitations and leg swelling.  Gastrointestinal: Negative for abdominal pain, diarrhea, nausea and vomiting.  Genitourinary: Negative for difficulty urinating and dysuria.  Musculoskeletal: Negative for joint swelling and myalgias.  Skin: Negative for color change and rash.  Neurological: Negative for dizziness, light-headedness and headaches.  Psychiatric/Behavioral: Negative for agitation and dysphoric mood.       Objective:     Blood pressure rechecked by me:  130/82  Physical Exam  Constitutional: He appears well-developed and well-nourished. No distress.  HENT:  Nose: Nose normal.  Mouth/Throat: Oropharynx is clear and moist.  Neck: Neck supple.  Cardiovascular: Normal rate and regular rhythm.  Pulmonary/Chest: Effort normal and breath sounds normal. No respiratory distress.  Abdominal: Soft. Bowel sounds are normal. There is no tenderness.  Musculoskeletal: He exhibits no edema or tenderness.  Feet:  Some decreased sensation in toes compared to extending up foot.  Able to sense pin prick and light touch toes.  No lesions.  DP pulses palpable and equal bilaterally.   Lymphadenopathy:    He has no cervical adenopathy.  Skin: No rash noted. No erythema.  Psychiatric: He has a normal mood and affect. His behavior is normal.    BP 130/82   Pulse 90   Temp 97.8 F (36.6 C) (Oral)   Resp 18   Wt 179 lb 3.2 oz (81.3 kg)   SpO2 99%   BMI 27.25 kg/m  Wt Readings from Last 3 Encounters:  04/20/18 179 lb 3.2 oz (81.3 kg)  01/22/18 187 lb (84.8 kg)  12/04/17 192 lb (87.1 kg)     Lab Results  Component Value Date   WBC 5.9 01/12/2017   HGB 15.4 01/12/2017   HCT 43.8 01/12/2017     PLT 216.0 01/12/2017   GLUCOSE 274 (H) 09/12/2017   CHOL 220 (H) 09/12/2017   TRIG 376.0 (H) 09/12/2017   HDL 51.80 09/12/2017   LDLDIRECT 121.0 09/12/2017   LDLCALC 129 (H) 02/03/2014   ALT 105 (H) 09/12/2017   AST 62 (H) 09/12/2017   NA 139 09/12/2017   K 4.8 09/12/2017   CL 100 09/12/2017   CREATININE 1.02 09/12/2017   BUN 18 09/12/2017   CO2 28 09/12/2017   TSH 1.46 01/12/2017   PSA 0.22 04/26/2017  HGBA1C 9.6 12/04/2017   MICROALBUR 2.8 (H) 01/12/2017       Assessment & Plan:   Problem List Items Addressed This Visit    Abnormal liver function tests    Discussed with him today.  Discussed abdominal ultrasound.  He declines.  Discussed need to decrease alcohol intake.  Discussed importance of getting his sugars under better control.  Diet, exercise and weight loss.  Follow.        Alcohol abuse    Discussed the need to decrease alcohol intake.  Follow.        Diabetes ([place])    Has adjusted his diet.  Tried to lose weight.  On ozempic and jardiance now. Off metformin.  Unable to swallow the large tablets.  Pharmacy notified to change back to the elongated tablets.  He was able to swallow these better.  Follow met b and a1c.        Relevant Medications   metFORMIN (GLUCOPHAGE XR) 500 MG 24 hr tablet   Other Relevant Orders   Hemoglobin V7M   Basic metabolic panel   Vitamin B34   Microalbumin / creatinine urine ratio   Essential hypertension, benign    Blood pressure under good control.  Continue same medication regimen.  Follow pressures.  Follow metabolic panel.        Relevant Orders   CBC with Differential/Platelet   TSH   GERD (gastroesophageal reflux disease) (Chronic)    Controlled on current regimen.  Follow.       Hypercholesterolemia    Has adjusted his diet some.  Has lost weight.  Follow lipid panel.  Hold on statin medication until can sort through his liver issues.        Relevant Orders   Hepatic function panel   Lipid panel        Einar Pheasant, MD

## 2018-04-23 ENCOUNTER — Encounter: Payer: Self-pay | Admitting: Internal Medicine

## 2018-04-23 NOTE — Assessment & Plan Note (Signed)
Has adjusted his diet.  Tried to lose weight.  On ozempic and jardiance now. Off metformin.  Unable to swallow the large tablets.  Pharmacy notified to change back to the elongated tablets.  He was able to swallow these better.  Follow met b and a1c.

## 2018-04-23 NOTE — Assessment & Plan Note (Signed)
Controlled on current regimen.  Follow.  

## 2018-04-23 NOTE — Assessment & Plan Note (Signed)
Discussed with him today.  Discussed abdominal ultrasound.  He declines.  Discussed need to decrease alcohol intake.  Discussed importance of getting his sugars under better control.  Diet, exercise and weight loss.  Follow.

## 2018-04-23 NOTE — Assessment & Plan Note (Signed)
Discussed the need to decrease alcohol intake.  Follow.   

## 2018-04-23 NOTE — Assessment & Plan Note (Signed)
Blood pressure under good control.  Continue same medication regimen.  Follow pressures.  Follow metabolic panel.   

## 2018-04-23 NOTE — Assessment & Plan Note (Signed)
Has adjusted his diet some.  Has lost weight.  Follow lipid panel.  Hold on statin medication until can sort through his liver issues.

## 2018-06-01 ENCOUNTER — Encounter: Payer: Self-pay | Admitting: Family Medicine

## 2018-06-01 ENCOUNTER — Ambulatory Visit (INDEPENDENT_AMBULATORY_CARE_PROVIDER_SITE_OTHER): Payer: BC Managed Care – PPO | Admitting: Family Medicine

## 2018-06-01 ENCOUNTER — Other Ambulatory Visit (INDEPENDENT_AMBULATORY_CARE_PROVIDER_SITE_OTHER): Payer: BC Managed Care – PPO

## 2018-06-01 VITALS — BP 104/58 | HR 94 | Temp 98.4°F | Ht 67.0 in | Wt 182.0 lb

## 2018-06-01 DIAGNOSIS — E7800 Pure hypercholesterolemia, unspecified: Secondary | ICD-10-CM

## 2018-06-01 DIAGNOSIS — R112 Nausea with vomiting, unspecified: Secondary | ICD-10-CM

## 2018-06-01 DIAGNOSIS — I10 Essential (primary) hypertension: Secondary | ICD-10-CM

## 2018-06-01 DIAGNOSIS — E119 Type 2 diabetes mellitus without complications: Secondary | ICD-10-CM

## 2018-06-01 DIAGNOSIS — R42 Dizziness and giddiness: Secondary | ICD-10-CM | POA: Diagnosis not present

## 2018-06-01 DIAGNOSIS — R55 Syncope and collapse: Secondary | ICD-10-CM

## 2018-06-01 LAB — LIPID PANEL
Cholesterol: 272 mg/dL — ABNORMAL HIGH (ref 0–200)
HDL: 53.5 mg/dL (ref >39.00)
Total CHOL/HDL Ratio: 5
Triglycerides: 420 mg/dL — ABNORMAL HIGH (ref 0.0–149.0)

## 2018-06-01 LAB — HEMOGLOBIN A1C: Hgb A1c MFr Bld: 9.5 % — ABNORMAL HIGH (ref 4.6–6.5)

## 2018-06-01 LAB — CBC WITH DIFFERENTIAL/PLATELET
Basophils Absolute: 0 10*3/uL (ref 0.0–0.1)
Basophils Relative: 0.3 % (ref 0.0–3.0)
Eosinophils Absolute: 0 10*3/uL (ref 0.0–0.7)
Eosinophils Relative: 0.5 % (ref 0.0–5.0)
HCT: 50.9 % (ref 39.0–52.0)
Hemoglobin: 17.1 g/dL — ABNORMAL HIGH (ref 13.0–17.0)
Lymphocytes Relative: 13.3 % (ref 12.0–46.0)
Lymphs Abs: 1.3 10*3/uL (ref 0.7–4.0)
MCHC: 33.6 g/dL (ref 30.0–36.0)
MCV: 102.2 fl — ABNORMAL HIGH (ref 78.0–100.0)
Monocytes Absolute: 0.9 10*3/uL (ref 0.1–1.0)
Monocytes Relative: 9.7 % (ref 3.0–12.0)
Neutro Abs: 7.2 10*3/uL (ref 1.4–7.7)
Neutrophils Relative %: 76.2 % (ref 43.0–77.0)
Platelets: 250 10*3/uL (ref 150.0–400.0)
RBC: 4.98 Mil/uL (ref 4.22–5.81)
RDW: 13.8 % (ref 11.5–15.5)
WBC: 9.4 10*3/uL (ref 4.0–10.5)

## 2018-06-01 LAB — BASIC METABOLIC PANEL
BUN: 36 mg/dL — ABNORMAL HIGH (ref 6–23)
CO2: 19 mEq/L (ref 19–32)
Calcium: 9.1 mg/dL (ref 8.4–10.5)
Chloride: 96 mEq/L (ref 96–112)
Creatinine, Ser: 1.35 mg/dL (ref 0.40–1.50)
GFR: 58.57 mL/min — ABNORMAL LOW (ref >60.00)
Glucose, Bld: 338 mg/dL — ABNORMAL HIGH (ref 70–99)
Potassium: 4.7 mEq/L (ref 3.5–5.1)
Sodium: 140 mEq/L (ref 135–145)

## 2018-06-01 LAB — MICROALBUMIN / CREATININE URINE RATIO
Creatinine,U: 53.8 mg/dL
Microalb Creat Ratio: 1.3 mg/g (ref 0.0–30.0)
Microalb, Ur: <0.7 mg/dL (ref 0.0–1.9)

## 2018-06-01 LAB — HEPATIC FUNCTION PANEL
ALT: 28 U/L (ref 0–53)
AST: 12 U/L (ref 0–37)
Albumin: 4.4 g/dL (ref 3.5–5.2)
Alkaline Phosphatase: 55 U/L (ref 39–117)
Bilirubin, Direct: 0.2 mg/dL (ref 0.0–0.3)
Total Bilirubin: 0.8 mg/dL (ref 0.2–1.2)
Total Protein: 7 g/dL (ref 6.0–8.3)

## 2018-06-01 LAB — LDL CHOLESTEROL, DIRECT: Direct LDL: 147 mg/dL

## 2018-06-01 LAB — TSH: TSH: 1.61 u[IU]/mL (ref 0.35–4.50)

## 2018-06-01 LAB — VITAMIN B12: Vitamin B-12: 118 pg/mL — ABNORMAL LOW (ref 211–911)

## 2018-06-01 NOTE — Progress Notes (Signed)
Subjective:    [name]: Phillip Shepard, male    DOB: Jan 11, 1966, 52 y.o.   MRN: [id number]  HPI   Patient presents to clinic due to having episode of syncope last night.  States he and wife both ate a hamburger casserole the day before, and were vomiting.  States he threw up 3 times, decided to take a hot bath to try and make himself feel better.  States he bent forward to dry off legs and feet, upon standing back up he felt hot and dizzy, fell forward and hit bridge of nose on corner of shower.  States he then fell backward & landed on floor, denies hitting head again.  States as soon as he fell he got right back up.  His father checked patient's blood pressure at this time and it was 136/80, his blood sugar was 277.  States after this he went to bed to lay down, fell asleep for a few hours.  Began to have upset stomach again and threw up 3 more times.  Upon waking up this morning states he is feeling much better.  He had a light breakfast of some oatmeal and water.  States oatmeal stayed down, no longer has any nausea.  Denies any more feelings of dizziness.  Patient also had scheduled lab work drawn this a.m. before coming to appointment.  Lab work drawn was CBC, hemoglobin A1c, hepatic function, lipid panel, TSH, basic metabolic panel, vitamin B12.  Patient Active Problem List   Diagnosis Date Noted  . Left shoulder pain 09/05/2017  . Cough 04/30/2015  . Heme positive stool 03/07/2014  . Shoulder pain, right 02/10/2014  . Abnormal liver function tests 10/27/2013  . Alcohol abuse 10/27/2013  . GERD (gastroesophageal reflux disease) 07/18/2013  . Kidney stones 07/18/2013  . Essential hypertension, benign 07/16/2013  . Diabetes ([facility]) 07/16/2013  . Hypercholesterolemia 07/16/2013   Social History   Tobacco Use  . Smoking status: Never Smoker  . Smokeless tobacco: Former Neurosurgeon    Types: Chew  . Tobacco comment: [place]  Substance Use Topics  . Alcohol use: Yes   Alcohol/week: 0.0 standard drinks    Comment: drinks 3-4 beers per day   Family History  Problem Relation Age of Onset  . Hyperlipidemia Mother   . Hypertension Father   . Arthritis Maternal Grandmother   . Diabetes Maternal Grandmother   . Lung cancer Maternal Grandfather   . Heart disease Paternal Grandfather   . Stroke Paternal Grandfather     Review of Systems  Constitutional: Negative for chills, fatigue and fever.  HENT: Negative for congestion, ear pain, sinus pain and sore throat.   Eyes: Negative.   Respiratory: Negative for cough, shortness of breath and wheezing.   Cardiovascular: Negative for chest pain, palpitations and leg swelling.  Gastrointestinal: +vomiting x6 episodes yesterday Genitourinary: Negative for dysuria, frequency and urgency.  Musculoskeletal: Negative for arthralgias and myalgias.  Skin: Negative for color change, pallor and rash.  Neurological: Lightheaded with syncope x1 last night  Psychiatric/Behavioral: The patient is not nervous/anxious.       Objective:   Physical Exam   Constitutional: He appears well-developed and well-nourished. No distress.  HENT:  Head: Normocephalic and atraumatic.  Eyes: Pupils are equal, round, and reactive to light. Conjunctivae and EOM are normal. No scleral icterus.  Neck: Normal range of motion. Neck supple. No tracheal deviation present.  Cardiovascular: Normal rate, regular rhythm and normal heart sounds.  Pulmonary/Chest: Effort normal and breath sounds  normal. No respiratory distress. He has no wheezes. He has no rales.  Abdominal: Soft. Bowel sounds are normal. There is no tenderness.  Neurological: He is alert and oriented to person, place, and time. Gait normal.  Speech clear.  Smile symmetrical.  Grips equal and strong.  Quadricep strength normal. Skin: Skin is warm and dry. He is not diaphoretic. No pallor.  Psychiatric: He has a normal mood and affect. His behavior is normal. Thought content normal.    Nursing note and vitals reviewed.   Vitals:   06/01/18 1103  BP: (!) 104/58  Pulse: 94  Temp: 98.4 F (36.9 C)  SpO2: 98%      Assessment & Plan:   Syncope/lightheadedness - suspect this could have been related to vomiting, and quick change in position when drying self off after taking bath.  EKG done in clinic and personally reviewed by me.  Rhythm is sinus, no acute abnormality noted.  EKG does show a slight nonspecific T wave abnormality, and due to patient's medical history including hypertension, diabetes and this episode of syncope I will do referral to cardiology for further evaluation.  Nausea/vomiting- suspect this was related to a viral gastroenteritis or from the hamburger casserole patient and his wife both ate which caused him both to have upset stomach symptoms.  Advised to eat bland diet with clear liquids for the next day, then slowly advance diet as tolerated.   HTN -  blood pressure stable on current regimen.  Patient has ability to monitor blood pressure at home, advised to check blood pressure a few times a week and keep a log.  Type 2 diabetes - patient's blood sugar was not hypoglycemic after he had syncope episode.  Patient advised to keep an eye on blood sugar readings and also keep a log of these.  Continue current Diabetes medications at this time.  Keep regular follow-up as already planned.  Return to clinic sooner if issues arise.  If you begin to have chest pain shortness of breath or another episode of syncope go to the emergency department right away.

## 2018-06-05 ENCOUNTER — Telehealth: Payer: Self-pay | Admitting: Family Medicine

## 2018-06-05 NOTE — Telephone Encounter (Signed)
Faxed at 4:42pm

## 2018-06-05 NOTE — Telephone Encounter (Signed)
Kim called from [facility] states pt has a appt for tomorrow and needs a copy of pt EKG. Please advise and Thank you!  Fax number [phone].

## 2018-06-06 ENCOUNTER — Other Ambulatory Visit: Payer: Self-pay | Admitting: Internal Medicine

## 2018-06-06 DIAGNOSIS — D582 Other hemoglobinopathies: Secondary | ICD-10-CM

## 2018-06-06 DIAGNOSIS — R7989 Other specified abnormal findings of blood chemistry: Secondary | ICD-10-CM

## 2018-06-06 NOTE — Progress Notes (Signed)
Order placed for f/u labs.  

## 2018-06-08 ENCOUNTER — Other Ambulatory Visit: Payer: Self-pay | Admitting: Internal Medicine

## 2018-06-21 ENCOUNTER — Other Ambulatory Visit: Payer: Self-pay | Admitting: Internal Medicine

## 2018-07-01 ENCOUNTER — Other Ambulatory Visit: Payer: Self-pay | Admitting: Internal Medicine

## 2018-08-04 ENCOUNTER — Other Ambulatory Visit: Payer: Self-pay | Admitting: Internal Medicine

## 2018-08-27 ENCOUNTER — Ambulatory Visit: Payer: BC Managed Care – PPO | Admitting: Internal Medicine

## 2018-08-27 ENCOUNTER — Encounter: Payer: Self-pay | Admitting: Internal Medicine

## 2018-08-27 VITALS — BP 126/70 | HR 89 | Temp 98.5°F | Resp 16 | Ht 67.0 in | Wt 189.2 lb

## 2018-08-27 DIAGNOSIS — R7989 Other specified abnormal findings of blood chemistry: Secondary | ICD-10-CM

## 2018-08-27 DIAGNOSIS — E119 Type 2 diabetes mellitus without complications: Secondary | ICD-10-CM

## 2018-08-27 DIAGNOSIS — Z0000 Encounter for general adult medical examination without abnormal findings: Secondary | ICD-10-CM

## 2018-08-27 DIAGNOSIS — R945 Abnormal results of liver function studies: Secondary | ICD-10-CM

## 2018-08-27 DIAGNOSIS — F1010 Alcohol abuse, uncomplicated: Secondary | ICD-10-CM

## 2018-08-27 DIAGNOSIS — Z125 Encounter for screening for malignant neoplasm of prostate: Secondary | ICD-10-CM | POA: Diagnosis not present

## 2018-08-27 DIAGNOSIS — E7800 Pure hypercholesterolemia, unspecified: Secondary | ICD-10-CM

## 2018-08-27 DIAGNOSIS — I10 Essential (primary) hypertension: Secondary | ICD-10-CM

## 2018-08-27 DIAGNOSIS — K219 Gastro-esophageal reflux disease without esophagitis: Secondary | ICD-10-CM

## 2018-08-27 DIAGNOSIS — G629 Polyneuropathy, unspecified: Secondary | ICD-10-CM | POA: Insufficient documentation

## 2018-08-27 MED ORDER — SEMAGLUTIDE (1 MG/DOSE) 2 MG/1.5ML ~~LOC~~ SOPN: 1.0000 mg | PEN_INJECTOR | SUBCUTANEOUS | 1 refills | Status: DC

## 2018-08-27 MED ORDER — ROSUVASTATIN CALCIUM 20 MG PO TABS: 20.0000 mg | ORAL_TABLET | Freq: Every day | ORAL | 0 refills | Status: DC

## 2018-08-27 NOTE — Assessment & Plan Note (Signed)
Discussed with him today.  He plans to decrease alcohol intake.

## 2018-08-27 NOTE — Assessment & Plan Note (Signed)
Blood pressure under good control.  Continue same medication regimen.  Follow pressures.  Follow metabolic panel.   

## 2018-08-27 NOTE — Assessment & Plan Note (Signed)
On crestor.  Low cholesterol diet and exercise.  Follow lipid panel and liver function tests.   

## 2018-08-27 NOTE — Assessment & Plan Note (Signed)
Controlled on current regimen.  Follow.  

## 2018-08-27 NOTE — Progress Notes (Signed)
[name]: Phillip Shepard, male   DOB: 06/12/1966, 53 y.o.   MRN: [id number]   Subjective:    [name]: Phillip CeoSteven T Shepard, male    DOB: 03/11/1966, 53 y.o.   MRN: [id number]  HPI  Patient here for his physical exam.  Saw cardiology 06/21/18 for evaluation for syncopal episode.  Had scheduled him for stress test.  He cancelled.  Discussed further w/up with him today.  He declines.  No further syncopal episodes.  No chest pain.  No sob.  No acid reflux.  No abdominal pain.  Bowels moving.  Taking crestor now.  Tolerating.  Not taking metformin.  Caused joint aching.  When stopped metformin, aching stopped.  States sugars are still elevated.  AM sugars averaging 200-210.  PM sugars >180.  Is not watching his diet as well.  Does plan to cut down on alcohol significantly.  Started yesterday.  Does report noticing bilateral toes - more sensitive.  Feels like he has pads on his toes.  Discussed possible neuropathy.  He is taking sublingual B12.  Does not want injections.  Discussed alpha lipoic acid.     Past Medical History:  Diagnosis Date  . Allergy   . Diabetes mellitus without complication ([facility])   . Dysphagia    s/p esophageal dilatation  . GERD (gastroesophageal reflux disease)   . History of chickenpox   . History of kidney stones   . Hypercholesterolemia   . Hypertension   . Kidney stones 07/18/2013   Past Surgical History:  Procedure Laterality Date  . ESOPHAGEAL DILATION    . TONSILLECTOMY  1970   Family History  Problem Relation Age of Onset  . Hyperlipidemia Mother   . Hypertension Father   . Arthritis Maternal Grandmother   . Diabetes Maternal Grandmother   . Lung cancer Maternal Grandfather   . Heart disease Paternal Grandfather   . Stroke Paternal Grandfather    Social History   Socioeconomic History  . Marital status: Married    Spouse name: Diane  . Number of children: 2  . Years of education: Not on file  . Highest education level: Not on file    Occupational History    Employer: [employer]  Social Needs  . Financial resource strain: Not on file  . Food insecurity:    Worry: Not on file    Inability: Not on file  . Transportation needs:    Medical: Not on file    Non-medical: Not on file  Tobacco Use  . Smoking status: Never Smoker  . Smokeless tobacco: Former NeurosurgeonUser    Types: Chew  . Tobacco comment: [place]  Substance and Sexual Activity  . Alcohol use: Yes    Alcohol/week: 0.0 standard drinks    Comment: drinks 3-4 beers per day  . Drug use: No  . Sexual activity: Yes  Lifestyle  . Physical activity:    Days per week: Not on file    Minutes per session: Not on file  . Stress: Not on file  Relationships  . Social connections:    Talks on phone: Not on file    Gets together: Not on file    Attends religious service: Not on file    Active member of club or organization: Not on file    Attends meetings of clubs or organizations: Not on file    Relationship status: Not on file  Other Topics Concern  . Not on file  Social History Narrative  . Not on  file    Outpatient Encounter Medications as of 08/27/2018  Medication Sig  . aspirin (ASPIRIN CHILDRENS) 81 MG chewable tablet Chew 1 tablet (81 mg total) by mouth daily.  Marland Kitchen JARDIANCE 25 MG TABS tablet TAKE 1 TABLET BY MOUTH EVERY DAY  . lisinopril (PRINIVIL,ZESTRIL) 20 MG tablet TAKE 1 TABLET BY MOUTH EVERY DAY  . omeprazole (PRILOSEC) 20 MG capsule Take 20 mg by mouth daily.  . rosuvastatin (CRESTOR) 20 MG tablet Take 1 tablet (20 mg total) by mouth daily.  . Semaglutide, 1 MG/DOSE, (OZEMPIC, 1 MG/DOSE,) 2 MG/1.5ML SOPN Inject 1 mg into the skin once a week.  . [DISCONTINUED] OZEMPIC, 0.25 OR 0.5 MG/DOSE, 2 MG/1.5ML SOPN INJECT 0.25 MG INTO THE SKIN ONCE A WEEK. AFTER 4 WEEKS, INCREASE TO 0.5 MG ONCE WEEKLY.   No facility-administered encounter medications on file as of 08/27/2018.     Review of Systems  Constitutional: Negative for appetite change and  unexpected weight change.  HENT: Negative for congestion and sinus pressure.   Eyes: Negative for pain and visual disturbance.  Respiratory: Negative for cough, chest tightness and shortness of breath.   Cardiovascular: Negative for chest pain, palpitations and leg swelling.  Gastrointestinal: Negative for abdominal pain, diarrhea, nausea and vomiting.  Genitourinary: Negative for difficulty urinating and dysuria.  Musculoskeletal: Negative for joint swelling and myalgias.  Skin: Negative for color change and rash.  Neurological: Negative for dizziness, light-headedness and headaches.  Hematological: Negative for adenopathy. Does not bruise/bleed easily.  Psychiatric/Behavioral: Negative for agitation and dysphoric mood.       Objective:    Physical Exam Constitutional:      General: He is not in acute distress.    Appearance: Normal appearance. He is well-developed.  HENT:     Head: Normocephalic and atraumatic.     Nose: Nose normal. No congestion.     Mouth/Throat:     Pharynx: No oropharyngeal exudate or posterior oropharyngeal erythema.  Eyes:     General:        Right eye: No discharge.        Left eye: No discharge.     Conjunctiva/sclera: Conjunctivae normal.  Neck:     Musculoskeletal: Neck supple. No muscular tenderness.     Thyroid: No thyromegaly.  Cardiovascular:     Rate and Rhythm: Normal rate and regular rhythm.  Pulmonary:     Effort: No respiratory distress.     Breath sounds: Normal breath sounds. No wheezing.  Abdominal:     General: Bowel sounds are normal.     Palpations: Abdomen is soft.     Tenderness: There is no abdominal tenderness.  Genitourinary:    Comments: Not performed.   Musculoskeletal:        General: No tenderness.  Lymphadenopathy:     Cervical: No cervical adenopathy.  Skin:    Findings: No erythema or rash.  Neurological:     Mental Status: He is alert and oriented to person, place, and time.  Psychiatric:        Mood and  Affect: Mood normal.        Behavior: Behavior normal.     BP 126/70 (BP Location: Left Arm, Patient Position: Sitting, Cuff Size: Normal)   Pulse 89   Temp 98.5 F (36.9 C) (Oral)   Resp 16   Ht 5\' 7"  (1.702 m)   Wt 189 lb 3.2 oz (85.8 kg)   SpO2 99%   BMI 29.63 kg/m  Wt Readings from Last 3  Encounters:  08/27/18 189 lb 3.2 oz (85.8 kg)  06/01/18 182 lb (82.6 kg)  04/20/18 179 lb 3.2 oz (81.3 kg)     Lab Results  Component Value Date   WBC 9.4 06/01/2018   HGB 17.1 (H) 06/01/2018   HCT 50.9 06/01/2018   PLT 250.0 06/01/2018   GLUCOSE 338 (H) 06/01/2018   CHOL 272 (H) 06/01/2018   TRIG 420.0 (H) 06/01/2018   HDL 53.50 06/01/2018   LDLDIRECT 147.0 06/01/2018   LDLCALC 129 (H) 02/03/2014   ALT 28 06/01/2018   AST 12 06/01/2018   NA 140 06/01/2018   K 4.7 06/01/2018   CL 96 06/01/2018   CREATININE 1.35 06/01/2018   BUN 36 (H) 06/01/2018   CO2 19 06/01/2018   TSH 1.61 06/01/2018   PSA 0.22 04/26/2017   HGBA1C 9.5 (H) 06/01/2018   MICROALBUR <0.7 06/01/2018       Assessment & Plan:   Problem List Items Addressed This Visit    Abnormal liver function tests    Have discussed further w/up with him.  Discussed abdominal ultrasound.  He declines.  Need to get sugars under better control.  Diet, exercise and weight loss.  Follow liver function tests.  He wants to hold on further w/up.        Relevant Orders   CBC with Differential/Platelet   Hepatic function panel   Ferritin   Alcohol abuse    Discussed with him today.  He plans to decrease alcohol intake.        Diabetes ([facility])    Low carb diet and exercise.  Plans to get more serious about diet and exercise.  On Jardiance and ozempic.  Will increase ozempic to 1mg .  Follow sugars.  Follow metabolic panel and a1c.        Relevant Medications   rosuvastatin (CRESTOR) 20 MG tablet   Semaglutide, 1 MG/DOSE, (OZEMPIC, 1 MG/DOSE,) 2 MG/1.5ML SOPN   Other Relevant Orders   Hemoglobin A1c   Basic metabolic panel    Essential hypertension, benign    Blood pressure under good control.  Continue same medication regimen.  Follow pressures.  Follow metabolic panel.        Relevant Medications   rosuvastatin (CRESTOR) 20 MG tablet   GERD (gastroesophageal reflux disease) (Chronic)    Controlled on current regimen.  Follow.        Hypercholesterolemia    On crestor.  Low cholesterol diet and exercise.  Follow lipid panel and liver function tests.        Relevant Medications   rosuvastatin (CRESTOR) 20 MG tablet   Other Relevant Orders   Lipid panel   Neuropathy    Symptoms as outlined. - bilateral toes.  Question of neuropathy.  B12 low.  Taking sublingual b12.  Declines injections.  Start alpha lipoic acid.  Follow.         Other Visit Diagnoses    Routine general medical examination at a health care facility    -  Primary   Prostate cancer screening       Relevant Orders   PSA       [name] Lilly, MD

## 2018-08-27 NOTE — Assessment & Plan Note (Signed)
Symptoms as outlined. - bilateral toes.  Question of neuropathy.  B12 low.  Taking sublingual b12.  Declines injections.  Start alpha lipoic acid.  Follow.

## 2018-08-27 NOTE — Assessment & Plan Note (Signed)
Low carb diet and exercise.  Plans to get more serious about diet and exercise.  On Jardiance and ozempic.  Will increase ozempic to 1mg .  Follow sugars.  Follow metabolic panel and a1c.

## 2018-08-27 NOTE — Assessment & Plan Note (Signed)
Have discussed further w/up with him.  Discussed abdominal ultrasound.  He declines.  Need to get sugars under better control.  Diet, exercise and weight loss.  Follow liver function tests.  He wants to hold on further w/up.

## 2018-08-31 ENCOUNTER — Ambulatory Visit: Payer: Self-pay | Admitting: *Deleted

## 2018-08-31 NOTE — Telephone Encounter (Signed)
Summary: what was the name of the OTC medication recommended?   Pt's wife calling. States that would like to hear from a nurse. At last OV pt was told there was an OTC he could use for his feet but pt can't remember what it was. Pt's wife states that pt is having numbness and tingling in feet and he was told there was something OTC to take for that. Pt can be reached at [phone]     Per patient chart- patient advised- Alpha Lipoic Acid.

## 2018-09-20 ENCOUNTER — Other Ambulatory Visit: Payer: BC Managed Care – PPO

## 2018-09-20 ENCOUNTER — Other Ambulatory Visit: Payer: Self-pay | Admitting: Internal Medicine

## 2018-09-20 MED ORDER — OSELTAMIVIR PHOSPHATE 75 MG PO CAPS: 75.0000 mg | ORAL_CAPSULE | Freq: Two times a day (BID) | ORAL | 0 refills | Status: DC

## 2018-09-20 NOTE — Progress Notes (Signed)
rx sent in for tamiflu.  

## 2018-10-01 ENCOUNTER — Other Ambulatory Visit: Payer: Self-pay | Admitting: Internal Medicine

## 2018-10-11 ENCOUNTER — Other Ambulatory Visit: Payer: Self-pay | Admitting: Internal Medicine

## 2018-10-22 ENCOUNTER — Other Ambulatory Visit: Payer: Self-pay | Admitting: Internal Medicine

## 2018-10-22 NOTE — Telephone Encounter (Signed)
Copied from CRM [id number]. Topic: Quick Communication - Rx Refill/Question >> Oct 22, 2018  6:41 PM Jens Som A wrote: Medication: Semaglutide, 1 MG/DOSE, (OZEMPIC, 1 MG/DOSE,) 2 MG/1.5ML SOPN [[id number]] -Patient has been out of medication for 2 weeks. The pharmacy has sent a request with no response.  Has the patient contacted their pharmacy? Yes  (Agent: If no, request that the patient contact the pharmacy for the refill.) (Agent: If yes, when and what did the pharmacy advise?)  Preferred Pharmacy (with phone number or street name): [facility] [phone] [place], [place] - [address] [phone] (Phone) [phone] (Fax)    Agent: Please be advised that RX refills may take up to 3 business days. We ask that you follow-up with your pharmacy.

## 2018-10-23 ENCOUNTER — Other Ambulatory Visit: Payer: Self-pay | Admitting: Internal Medicine

## 2018-10-23 MED ORDER — SEMAGLUTIDE (1 MG/DOSE) 2 MG/1.5ML ~~LOC~~ SOPN: 1.0000 mg | PEN_INJECTOR | SUBCUTANEOUS | 1 refills | Status: DC

## 2018-10-23 NOTE — Telephone Encounter (Signed)
Requested Prescriptions  Pending Prescriptions Disp Refills  . Semaglutide, 1 MG/DOSE, (OZEMPIC, 1 MG/DOSE,) 2 MG/1.5ML SOPN 6 pen 1    Sig: Inject 1 mg into the skin once a week.     Endocrinology:  Diabetes - GLP-1 Receptor Agonists Failed - 10/22/2018  6:57 PM      Failed - HBA1C is between 0 and 7.9 and within 180 days    Hgb A1c MFr Bld  Date Value Ref Range Status  06/01/2018 9.5 (H) 4.6 - 6.5 % Final    Comment:    Glycemic Control Guidelines for People with Diabetes:Non Diabetic:  <6%Goal of Therapy: <7%Additional Action Suggested:  >8%          Passed - Valid encounter within last 6 months    Recent Outpatient Visits          1 month ago Routine general medical examination at a health care facility   [facility], Westley Hummer, MD   4 months ago Lightheadedness   [facility] [place], Janna Arch, FNP   6 months ago Abnormal liver function tests   [facility] [place], [place], MD   9 months ago Type 2 diabetes mellitus without complication, without long-term current use of insulin [facility])   [facility] [place], [place], [place]   10 months ago Type 2 diabetes mellitus without complication, without long-term current use of insulin [facility])   [facility], [place], [facility]      Future Appointments            In 1 month [name] Lincoln, MD [facility], [facility]

## 2018-11-29 ENCOUNTER — Ambulatory Visit: Payer: BC Managed Care – PPO | Admitting: Internal Medicine

## 2018-12-11 ENCOUNTER — Other Ambulatory Visit: Payer: Self-pay | Admitting: Internal Medicine

## 2018-12-14 ENCOUNTER — Other Ambulatory Visit (INDEPENDENT_AMBULATORY_CARE_PROVIDER_SITE_OTHER): Payer: BC Managed Care – PPO

## 2018-12-14 ENCOUNTER — Other Ambulatory Visit: Payer: Self-pay

## 2018-12-14 DIAGNOSIS — R945 Abnormal results of liver function studies: Secondary | ICD-10-CM | POA: Diagnosis not present

## 2018-12-14 DIAGNOSIS — E7800 Pure hypercholesterolemia, unspecified: Secondary | ICD-10-CM | POA: Diagnosis not present

## 2018-12-14 DIAGNOSIS — R7989 Other specified abnormal findings of blood chemistry: Secondary | ICD-10-CM

## 2018-12-14 DIAGNOSIS — E119 Type 2 diabetes mellitus without complications: Secondary | ICD-10-CM | POA: Diagnosis not present

## 2018-12-14 DIAGNOSIS — Z125 Encounter for screening for malignant neoplasm of prostate: Secondary | ICD-10-CM | POA: Diagnosis not present

## 2018-12-14 LAB — BASIC METABOLIC PANEL
BUN: 16 mg/dL (ref 6–23)
CO2: 27 mEq/L (ref 19–32)
Calcium: 9.5 mg/dL (ref 8.4–10.5)
Chloride: 103 mEq/L (ref 96–112)
Creatinine, Ser: 0.92 mg/dL (ref 0.40–1.50)
GFR: 85.61 mL/min (ref >60.00)
Glucose, Bld: 204 mg/dL — ABNORMAL HIGH (ref 70–99)
Potassium: 4.8 mEq/L (ref 3.5–5.1)
Sodium: 140 mEq/L (ref 135–145)

## 2018-12-14 LAB — HEMOGLOBIN A1C: Hgb A1c MFr Bld: 8.9 % — ABNORMAL HIGH (ref 4.6–6.5)

## 2018-12-14 LAB — PSA: PSA: 0.2 ng/mL (ref 0.10–4.00)

## 2018-12-14 LAB — LDL CHOLESTEROL, DIRECT: Direct LDL: 95 mg/dL

## 2018-12-14 LAB — CBC WITH DIFFERENTIAL/PLATELET
Basophils Absolute: 0.1 10*3/uL (ref 0.0–0.1)
Basophils Relative: 1 % (ref 0.0–3.0)
Eosinophils Absolute: 0.1 10*3/uL (ref 0.0–0.7)
Eosinophils Relative: 2.4 % (ref 0.0–5.0)
HCT: 41.8 % (ref 39.0–52.0)
Hemoglobin: 14.2 g/dL (ref 13.0–17.0)
Lymphocytes Relative: 24.6 % (ref 12.0–46.0)
Lymphs Abs: 1.3 10*3/uL (ref 0.7–4.0)
MCHC: 34.1 g/dL (ref 30.0–36.0)
MCV: 98.2 fl (ref 78.0–100.0)
Monocytes Absolute: 0.3 10*3/uL (ref 0.1–1.0)
Monocytes Relative: 6.4 % (ref 3.0–12.0)
Neutro Abs: 3.5 10*3/uL (ref 1.4–7.7)
Neutrophils Relative %: 65.6 % (ref 43.0–77.0)
Platelets: 188 10*3/uL (ref 150.0–400.0)
RBC: 4.26 Mil/uL (ref 4.22–5.81)
RDW: 13.7 % (ref 11.5–15.5)
WBC: 5.4 10*3/uL (ref 4.0–10.5)

## 2018-12-14 LAB — HEPATIC FUNCTION PANEL
ALT: 61 U/L — ABNORMAL HIGH (ref 0–53)
AST: 39 U/L — ABNORMAL HIGH (ref 0–37)
Albumin: 4.7 g/dL (ref 3.5–5.2)
Alkaline Phosphatase: 65 U/L (ref 39–117)
Bilirubin, Direct: 0.1 mg/dL (ref 0.0–0.3)
Total Bilirubin: 0.6 mg/dL (ref 0.2–1.2)
Total Protein: 6.9 g/dL (ref 6.0–8.3)

## 2018-12-14 LAB — LIPID PANEL
Cholesterol: 208 mg/dL — ABNORMAL HIGH (ref 0–200)
HDL: 58.3 mg/dL (ref >39.00)
NonHDL: 149.2
Total CHOL/HDL Ratio: 4
Triglycerides: 212 mg/dL — ABNORMAL HIGH (ref 0.0–149.0)
VLDL: 42.4 mg/dL — ABNORMAL HIGH (ref 0.0–40.0)

## 2018-12-14 LAB — FERRITIN: Ferritin: 144.9 ng/mL (ref 22.0–322.0)

## 2018-12-19 ENCOUNTER — Ambulatory Visit: Payer: BC Managed Care – PPO | Admitting: Internal Medicine

## 2018-12-20 ENCOUNTER — Encounter: Payer: Self-pay | Admitting: Internal Medicine

## 2018-12-20 ENCOUNTER — Ambulatory Visit (INDEPENDENT_AMBULATORY_CARE_PROVIDER_SITE_OTHER): Payer: BC Managed Care – PPO | Admitting: Internal Medicine

## 2018-12-20 ENCOUNTER — Other Ambulatory Visit: Payer: Self-pay

## 2018-12-20 DIAGNOSIS — K219 Gastro-esophageal reflux disease without esophagitis: Secondary | ICD-10-CM

## 2018-12-20 DIAGNOSIS — E7800 Pure hypercholesterolemia, unspecified: Secondary | ICD-10-CM

## 2018-12-20 DIAGNOSIS — E1165 Type 2 diabetes mellitus with hyperglycemia: Secondary | ICD-10-CM | POA: Diagnosis not present

## 2018-12-20 DIAGNOSIS — R945 Abnormal results of liver function studies: Secondary | ICD-10-CM

## 2018-12-20 DIAGNOSIS — F1010 Alcohol abuse, uncomplicated: Secondary | ICD-10-CM

## 2018-12-20 DIAGNOSIS — I10 Essential (primary) hypertension: Secondary | ICD-10-CM

## 2018-12-20 DIAGNOSIS — R7989 Other specified abnormal findings of blood chemistry: Secondary | ICD-10-CM

## 2018-12-20 NOTE — Progress Notes (Signed)
[name]: Phillip Shepard, male   DOB: 25-Mar-1966, 53 y.o.   MRN: [id number] Virtual Visit via Video Note  This visit type was conducted due to national recommendations for restrictions regarding the COVID-19 pandemic (e.g. social distancing).  This format is felt to be most appropriate for this patient at this time.  All issues noted in this document were discussed and addressed.  No physical exam was performed (except for noted visual exam findings with Video Visits).   I connected with Jill Side by a video enabled telemedicine application and verified that I am speaking with the correct person using two identifiers. Location patient: home Location provider: work Persons participating in the virtual visit: patient, provider  I discussed the limitations, risks, security and privacy concerns of performing an evaluation and management service by video and the availability of in person appointments. The patient expressed understanding and agreed to proceed.   Reason for visit: scheduled follow up.   HPI: He reports he is doing well.  Feels good.  Working only q third day. Handling stress.  Is trying to watch his diet.  States sugar this am 160 and last night 200.  Highest reading he has seen recently is 230.  No chest pain. No sob.  No syncope or near syncopal episodes.  No acid reflux.  No abdominal pain.  Bowels moving.  No known COVID exposure.  No fever, cough or congestion.  He has started walking.  Discussed diet, exercise and weight loss.  Discussed recent labs.  a1c still elevated, but improved.  Triglycerides improved.     ROS: See pertinent positives and negatives per HPI.  Past Medical History:  Diagnosis Date  . Allergy   . Diabetes mellitus without complication (HCC)   . Dysphagia    s/p esophageal dilatation  . GERD (gastroesophageal reflux disease)   . History of chickenpox   . History of kidney stones   . Hypercholesterolemia   . Hypertension   . Kidney stones  07/18/2013    Past Surgical History:  Procedure Laterality Date  . ESOPHAGEAL DILATION    . TONSILLECTOMY  1970    Family History  Problem Relation Age of Onset  . Hyperlipidemia Mother   . Hypertension Father   . Arthritis Maternal Grandmother   . Diabetes Maternal Grandmother   . Lung cancer Maternal Grandfather   . Heart disease Paternal Grandfather   . Stroke Paternal Grandfather     SOCIAL HX: reviewed.    Current Outpatient Medications:  .  aspirin (ASPIRIN CHILDRENS) 81 MG chewable tablet, Chew 1 tablet (81 mg total) by mouth daily., Disp: 30 tablet, Rfl: 0 .  JARDIANCE 25 MG TABS tablet, TAKE 1 TABLET BY MOUTH EVERY DAY, Disp: 30 tablet, Rfl: 3 .  lisinopril (PRINIVIL,ZESTRIL) 20 MG tablet, TAKE 1 TABLET BY MOUTH EVERY DAY, Disp: 90 tablet, Rfl: 3 .  omeprazole (PRILOSEC) 20 MG capsule, Take 20 mg by mouth daily., Disp: , Rfl:  .  OZEMPIC, 1 MG/DOSE, 2 MG/1.5ML SOPN, INJECT 1 MG INTO THE SKIN ONCE A WEEK., Disp: 6 pen, Rfl: 1 .  rosuvastatin (CRESTOR) 20 MG tablet, Take 1 tablet (20 mg total) by mouth daily., Disp: 30 tablet, Rfl: 0  EXAM:  VITALS per patient if applicable:  Weight 184 pounds.   GENERAL: alert, oriented, appears well and in no acute distress  HEENT: atraumatic, conjunttiva clear, no obvious abnormalities on inspection of external nose and ears  NECK: normal movements of the head and neck  LUNGS: on inspection no signs of respiratory distress, breathing rate appears normal, no obvious gross SOB, gasping or wheezing  CV: no obvious cyanosis  PSYCH/NEURO: pleasant and cooperative, no obvious depression or anxiety, speech and thought processing grossly intact  ASSESSMENT AND PLAN:  Discussed the following assessment and plan:  Abnormal liver function tests  Alcohol abuse  Type 2 diabetes mellitus with hyperglycemia, without long-term current use of insulin (HCC)  Essential hypertension, benign  Gastroesophageal reflux disease without  esophagitis  Hypercholesterolemia  Abnormal liver function tests Have discussed further w/up with him.  Discussed abdominal ultrasound.  He has declined.  He has adjusted his diet.  His sugars are doing better.  Diet, exercise and weight loss.  Follow liver function tests.    Alcohol abuse Discussed with him again today the need to decrease his alcohol intake.  He has decreased.  Follow.    Diabetes Low carb diet and exercise.  He has adjusted his diet.  Stays physically active.  a1c has improved.  Taking ozempic and jardiance.  Tolerating.  Will send in sugars over the next few weeks.  Will review. Discussed adjustments in medication.    Essential hypertension, benign Blood pressure has been doing relatively well.  Continue same medications.  Follow pressures.  Follow metabolic panel.    GERD (gastroesophageal reflux disease) Controlled on current regimen.    Hypercholesterolemia Triglycerides improved.  Continue low carb diet and exercise.  On crestor.  Follow lipid panel and liver function tests.      I discussed the assessment and treatment plan with the patient. The patient was provided an opportunity to ask questions and all were answered. The patient agreed with the plan and demonstrated an understanding of the instructions.   The patient was advised to call back or seek an in-person evaluation if the symptoms worsen or if the condition fails to improve as anticipated.    [name] Durhamharlene Maritza Hosterman, MD

## 2018-12-23 ENCOUNTER — Encounter: Payer: Self-pay | Admitting: Internal Medicine

## 2018-12-23 NOTE — Assessment & Plan Note (Signed)
Low carb diet and exercise.  He has adjusted his diet.  Stays physically active.  a1c has improved.  Taking ozempic and jardiance.  Tolerating.  Will send in sugars over the next few weeks.  Will review. Discussed adjustments in medication.

## 2018-12-23 NOTE — Assessment & Plan Note (Signed)
Have discussed further w/up with him.  Discussed abdominal ultrasound.  He has declined.  He has adjusted his diet.  His sugars are doing better.  Diet, exercise and weight loss.  Follow liver function tests.

## 2018-12-23 NOTE — Assessment & Plan Note (Signed)
Discussed with him again today the need to decrease his alcohol intake.  He has decreased.  Follow.

## 2018-12-23 NOTE — Assessment & Plan Note (Signed)
Controlled on current regimen.   

## 2018-12-23 NOTE — Assessment & Plan Note (Signed)
Triglycerides improved.  Continue low carb diet and exercise.  On crestor.  Follow lipid panel and liver function tests.

## 2018-12-23 NOTE — Assessment & Plan Note (Signed)
Blood pressure has been doing relatively well.  Continue same medications.  Follow pressures.  Follow metabolic panel.

## 2018-12-26 ENCOUNTER — Telehealth: Payer: Self-pay | Admitting: Internal Medicine

## 2018-12-26 DIAGNOSIS — R7989 Other specified abnormal findings of blood chemistry: Secondary | ICD-10-CM

## 2018-12-26 DIAGNOSIS — R945 Abnormal results of liver function studies: Secondary | ICD-10-CM

## 2018-12-26 DIAGNOSIS — E1165 Type 2 diabetes mellitus with hyperglycemia: Secondary | ICD-10-CM

## 2018-12-26 DIAGNOSIS — E7800 Pure hypercholesterolemia, unspecified: Secondary | ICD-10-CM

## 2018-12-26 NOTE — Telephone Encounter (Signed)
Called pt and made a 28m follow not a CPE, his cpe was in [place] of this year. Please put lab orders in, pt will come the day before his 61m follow up for labs.

## 2018-12-26 NOTE — Telephone Encounter (Signed)
Yes.  He is diabetic and will need labs rechecked.

## 2018-12-26 NOTE — Telephone Encounter (Signed)
Pt had fasting labs done on 12/14/18. Will he need fasting labs done again in August?

## 2018-12-28 NOTE — Telephone Encounter (Signed)
Future labs ordered. I ordered a BMP, Lipid, A1C, and Hepatic. He just had a CBC and PSA done in April. Let me know if anything needs to be added.

## 2018-12-28 NOTE — Telephone Encounter (Signed)
Orders signed.

## 2019-03-28 ENCOUNTER — Other Ambulatory Visit: Payer: Self-pay

## 2019-03-28 ENCOUNTER — Other Ambulatory Visit (INDEPENDENT_AMBULATORY_CARE_PROVIDER_SITE_OTHER): Payer: BC Managed Care – PPO

## 2019-03-28 DIAGNOSIS — E1165 Type 2 diabetes mellitus with hyperglycemia: Secondary | ICD-10-CM | POA: Diagnosis not present

## 2019-03-28 DIAGNOSIS — R945 Abnormal results of liver function studies: Secondary | ICD-10-CM | POA: Diagnosis not present

## 2019-03-28 DIAGNOSIS — E7800 Pure hypercholesterolemia, unspecified: Secondary | ICD-10-CM

## 2019-03-28 DIAGNOSIS — R7989 Other specified abnormal findings of blood chemistry: Secondary | ICD-10-CM

## 2019-03-28 LAB — LDL CHOLESTEROL, DIRECT: Direct LDL: 85 mg/dL

## 2019-03-28 LAB — LIPID PANEL
Cholesterol: 220 mg/dL — ABNORMAL HIGH (ref 0–200)
HDL: 49.9 mg/dL (ref >39.00)
Total CHOL/HDL Ratio: 4
Triglycerides: 643 mg/dL — ABNORMAL HIGH (ref 0.0–149.0)

## 2019-03-28 LAB — BASIC METABOLIC PANEL
BUN: 17 mg/dL (ref 6–23)
CO2: 26 mEq/L (ref 19–32)
Calcium: 10.2 mg/dL (ref 8.4–10.5)
Chloride: 101 mEq/L (ref 96–112)
Creatinine, Ser: 0.92 mg/dL (ref 0.40–1.50)
GFR: 85.52 mL/min (ref >60.00)
Glucose, Bld: 239 mg/dL — ABNORMAL HIGH (ref 70–99)
Potassium: 4.6 mEq/L (ref 3.5–5.1)
Sodium: 140 mEq/L (ref 135–145)

## 2019-03-28 LAB — HEPATIC FUNCTION PANEL
ALT: 57 U/L — ABNORMAL HIGH (ref 0–53)
AST: 51 U/L — ABNORMAL HIGH (ref 0–37)
Albumin: 4.7 g/dL (ref 3.5–5.2)
Alkaline Phosphatase: 88 U/L (ref 39–117)
Bilirubin, Direct: 0.1 mg/dL (ref 0.0–0.3)
Total Bilirubin: 0.6 mg/dL (ref 0.2–1.2)
Total Protein: 7.1 g/dL (ref 6.0–8.3)

## 2019-03-28 LAB — HEMOGLOBIN A1C: Hgb A1c MFr Bld: 8.5 % — ABNORMAL HIGH (ref 4.6–6.5)

## 2019-03-29 ENCOUNTER — Ambulatory Visit (INDEPENDENT_AMBULATORY_CARE_PROVIDER_SITE_OTHER): Payer: BC Managed Care – PPO | Admitting: Internal Medicine

## 2019-03-29 ENCOUNTER — Other Ambulatory Visit: Payer: Self-pay | Admitting: Internal Medicine

## 2019-03-29 DIAGNOSIS — R5383 Other fatigue: Secondary | ICD-10-CM

## 2019-03-29 DIAGNOSIS — I10 Essential (primary) hypertension: Secondary | ICD-10-CM | POA: Diagnosis not present

## 2019-03-29 DIAGNOSIS — E1165 Type 2 diabetes mellitus with hyperglycemia: Secondary | ICD-10-CM

## 2019-03-29 DIAGNOSIS — F1010 Alcohol abuse, uncomplicated: Secondary | ICD-10-CM | POA: Diagnosis not present

## 2019-03-29 DIAGNOSIS — E7800 Pure hypercholesterolemia, unspecified: Secondary | ICD-10-CM

## 2019-03-29 DIAGNOSIS — R7989 Other specified abnormal findings of blood chemistry: Secondary | ICD-10-CM

## 2019-03-29 DIAGNOSIS — K219 Gastro-esophageal reflux disease without esophagitis: Secondary | ICD-10-CM

## 2019-03-29 DIAGNOSIS — R351 Nocturia: Secondary | ICD-10-CM

## 2019-03-29 DIAGNOSIS — R945 Abnormal results of liver function studies: Secondary | ICD-10-CM

## 2019-03-29 DIAGNOSIS — R928 Other abnormal and inconclusive findings on diagnostic imaging of breast: Secondary | ICD-10-CM

## 2019-03-29 NOTE — Progress Notes (Signed)
[name]: Phillip Shepard, male   DOB: 05-25-1966, 53 y.o.   MRN: [id number]   Virtual Visit via video Note  This visit type was conducted due to national recommendations for restrictions regarding the COVID-19 pandemic (e.g. social distancing).  This format is felt to be most appropriate for this patient at this time.  All issues noted in this document were discussed and addressed.  No physical exam was performed (except for noted visual exam findings with Video Visits).   I connected with Kennon Rounds by a video enabled telemedicine application and verified that I am speaking with the correct person using two identifiers. Location patient: home Location provider: work  Persons participating in the virtual visit: patient, provider  I discussed the limitations, risks, security and privacy concerns of performing an evaluation and management service by video and the availability of in person appointments. The patient expressed understanding and agreed to proceed.   Reason for visit: scheduled follow up.   HPI: He reports he is doing relatively well.  Not watching his diet. Has cut down some on his alcohol intake.  Working.  Not as busy.  Trying to stay active.  No chest pain.  No sob.  No acid reflux.  No abdominal pina.  Bowels moving.  Some constipation.  Taking a laxative.  Keeps him regular.  No blood in stool.  Discussed recent labs.  Increased triglycerides.  He has been eating a lot of watermelon, cantaloupe.  Discussed low carb diet.  a1c improved some, but still elevated - 8.5.  Does report some nocturia..  Does not feel rested when wakes up in am. Increased fatigue.  Taking crestor.     ROS: See pertinent positives and negatives per HPI.  Past Medical History:  Diagnosis Date  . Allergy   . Diabetes mellitus without complication ([place])   . Dysphagia    s/p esophageal dilatation  . GERD (gastroesophageal reflux disease)   . History of chickenpox   . History of kidney stones    . Hypercholesterolemia   . Hypertension   . Kidney stones 07/18/2013    Past Surgical History:  Procedure Laterality Date  . ESOPHAGEAL DILATION    . TONSILLECTOMY  1970    Family History  Problem Relation Age of Onset  . Hyperlipidemia Mother   . Hypertension Father   . Arthritis Maternal Grandmother   . Diabetes Maternal Grandmother   . Lung cancer Maternal Grandfather   . Heart disease Paternal Grandfather   . Stroke Paternal Grandfather     SOCIAL HX: reviewed.     Current Outpatient Medications:  .  aspirin (ASPIRIN CHILDRENS) 81 MG chewable tablet, Chew 1 tablet (81 mg total) by mouth daily., Disp: 30 tablet, Rfl: 0 .  JARDIANCE 25 MG TABS tablet, TAKE 1 TABLET BY MOUTH EVERY DAY, Disp: 30 tablet, Rfl: 3 .  lisinopril (PRINIVIL,ZESTRIL) 20 MG tablet, TAKE 1 TABLET BY MOUTH EVERY DAY, Disp: 90 tablet, Rfl: 3 .  omeprazole (PRILOSEC) 20 MG capsule, Take 20 mg by mouth daily., Disp: , Rfl:  .  OZEMPIC, 1 MG/DOSE, 2 MG/1.5ML SOPN, INJECT 1 MG INTO THE SKIN ONCE A WEEK., Disp: 6 pen, Rfl: 1 .  rosuvastatin (CRESTOR) 20 MG tablet, Take 1 tablet (20 mg total) by mouth daily., Disp: 30 tablet, Rfl: 0  EXAM:  GENERAL: alert, oriented, appears well and in no acute distress  HEENT: atraumatic, conjunttiva clear, no obvious abnormalities on inspection of external nose and ears  NECK: normal movements  of the head and neck  LUNGS: on inspection no signs of respiratory distress, breathing rate appears normal, no obvious gross SOB, gasping or wheezing  CV: no obvious cyanosis  PSYCH/NEURO: pleasant and cooperative, no obvious depression or anxiety, speech and thought processing grossly intact  ASSESSMENT AND PLAN:  Discussed the following assessment and plan:  Abnormal liver function tests Have discussed further w/up with him again today. Discussed abdominal ultrasound.  He wants to hold on further testing at this time.  Get sugars under better control.  Low carb diet and  exercise.  Follow liver function tests.    Alcohol abuse Discussed the need to decrease alcohol intake.  Follow.    Diabetes a1c improved, but still elevated. a1c 8.5.  Discussed with him today.  On jardiance and ozempic.  No recorded sugar readings.  Not watching his diet.  Discussed low carb diet and exercise.  Send [facility] Lipid diet.  Agreed to f/u with Catie Travis to [facility] for medication adjustment.    Essential hypertension, benign Blood pressure has been under reasonable control.  Follow pressures.  Follow metabolic panel.    GERD (gastroesophageal reflux disease) Controlled on prilosec.    Hypercholesterolemia Elevated triglycerides as outlined.  Discussed low carb diet.  Send [facility] Lipid Diet.  On crestor.  LDL 80.  Follow.    Nocturia Reports increased nocturia.  No trouble initiating stream.  Discussed adjusting fluid intake to earlier in the day.  Also discussed evaluation for possible sleep apnea.    Fatigue Increased fatigue. Does not feel rested in am.  Nocturia.  Refer to pulmonary for evaluation for possible sleep apnea.      I discussed the assessment and treatment plan with the patient. The patient was provided an opportunity to ask questions and all were answered. The patient agreed with the plan and demonstrated an understanding of the instructions.   The patient was advised to call back or seek an in-person evaluation if the symptoms worsen or if the condition fails to improve as anticipated.   [name] Durhamharlene Taylia Berber, MD

## 2019-03-29 NOTE — Progress Notes (Signed)
Order placed for f/u left breast mammogram and ultrasound.   

## 2019-03-31 ENCOUNTER — Encounter: Payer: Self-pay | Admitting: Internal Medicine

## 2019-03-31 DIAGNOSIS — R5383 Other fatigue: Secondary | ICD-10-CM | POA: Insufficient documentation

## 2019-03-31 DIAGNOSIS — R351 Nocturia: Secondary | ICD-10-CM | POA: Insufficient documentation

## 2019-03-31 NOTE — Assessment & Plan Note (Signed)
Controlled on prilosec.   

## 2019-03-31 NOTE — Assessment & Plan Note (Signed)
Reports increased nocturia.  No trouble initiating stream.  Discussed adjusting fluid intake to earlier in the day.  Also discussed evaluation for possible sleep apnea.

## 2019-03-31 NOTE — Assessment & Plan Note (Signed)
Increased fatigue. Does not feel rested in am.  Nocturia.  Refer to pulmonary for evaluation for possible sleep apnea.

## 2019-03-31 NOTE — Assessment & Plan Note (Signed)
Discussed the need to decrease alcohol intake.  Follow.   

## 2019-03-31 NOTE — Assessment & Plan Note (Signed)
Elevated triglycerides as outlined.  Discussed low carb diet.  Send [facility] Lipid Diet.  On crestor.  LDL 80.  Follow.

## 2019-03-31 NOTE — Assessment & Plan Note (Signed)
Have discussed further w/up with him again today. Discussed abdominal ultrasound.  He wants to hold on further testing at this time.  Get sugars under better control.  Low carb diet and exercise.  Follow liver function tests.

## 2019-03-31 NOTE — Assessment & Plan Note (Signed)
a1c improved, but still elevated. a1c 8.5.  Discussed with him today.  On jardiance and ozempic.  No recorded sugar readings.  Not watching his diet.  Discussed low carb diet and exercise.  Send [facility] Lipid diet.  Agreed to f/u with Catie Darnelle Maffucci to follow for medication adjustment.

## 2019-03-31 NOTE — Assessment & Plan Note (Signed)
Blood pressure has been under reasonable control.  Follow pressures.  Follow metabolic panel.   

## 2019-04-01 ENCOUNTER — Telehealth: Payer: Self-pay

## 2019-04-01 NOTE — Telephone Encounter (Signed)
Mailed [facility] lipid diet

## 2019-04-01 NOTE — Telephone Encounter (Signed)
-----   Message from Einar Pheasant, MD sent at 03/31/2019  9:59 PM EDT ----- Regarding: [facility] lipid diet Please send pt a copy of [facility] Lipid Diet.  Thanks    Dr Nicki Reaper

## 2019-04-04 ENCOUNTER — Telehealth: Payer: BC Managed Care – PPO

## 2019-04-04 ENCOUNTER — Ambulatory Visit: Payer: Self-pay | Admitting: Pharmacist

## 2019-04-04 NOTE — Chronic Care Management (AMB) (Signed)
  Chronic Care Management   Note  04/04/2019 Name: Phillip Shepard MRN: [id number] DOB: [birth year]  Phillip Shepard is a 53 y.o. year old male who is a primary care patient of Einar Pheasant, MD. The CCM team was consulted for assistance with chronic disease management and care coordination needs.    Attempted to contact patient to discuss chronic care management. Left HIPAA compliant message for patient to return my call at his convenience.   Follow up plan: - If I do not hear back, will outreach again in 1-2 weeks  Catie Darnelle Maffucci, PharmD, [place] Pharmacist [place] [phone]

## 2019-04-04 NOTE — Progress Notes (Signed)
Reviewed.  Agree with plan   Dr Cheynne Virden 

## 2019-04-11 ENCOUNTER — Ambulatory Visit: Payer: Self-pay | Admitting: Pharmacist

## 2019-04-11 ENCOUNTER — Telehealth: Payer: BC Managed Care – PPO

## 2019-04-11 NOTE — Progress Notes (Signed)
[name]: Phillip Shepard, male   DOB: 10-17-1965, 53 y.o.   MRN: [id number] Reviewed.  Agree with plan.

## 2019-04-11 NOTE — Chronic Care Management (AMB) (Signed)
  Chronic Care Management   Note  04/11/2019 Name: EMRAN MOLZAHN MRN: [id number] DOB: Jul 27, 1966  OLUWATIMILEYIN VIVIER is a 53 y.o. year old male who is a primary care patient of Einar Pheasant, MD. The CCM team was consulted for assistance with chronic disease management and care coordination needs.    I had outreach scheduled with the patient today, but he contacted clinic to cancel the appointment, noting he would call back when he was ready to schedule with me.   Will await call from patient to schedule a phone visit.   Catie Darnelle Maffucci, PharmD, [place] Pharmacist [facility] [place] [phone]

## 2019-04-23 ENCOUNTER — Ambulatory Visit: Payer: BC Managed Care – PPO | Admitting: Podiatry

## 2019-04-23 ENCOUNTER — Other Ambulatory Visit: Payer: Self-pay | Admitting: Podiatry

## 2019-04-23 ENCOUNTER — Encounter: Payer: Self-pay | Admitting: Podiatry

## 2019-04-23 ENCOUNTER — Other Ambulatory Visit: Payer: Self-pay

## 2019-04-23 ENCOUNTER — Ambulatory Visit (INDEPENDENT_AMBULATORY_CARE_PROVIDER_SITE_OTHER): Payer: BC Managed Care – PPO

## 2019-04-23 DIAGNOSIS — M7752 Other enthesopathy of left foot: Secondary | ICD-10-CM

## 2019-04-23 DIAGNOSIS — M779 Enthesopathy, unspecified: Secondary | ICD-10-CM

## 2019-04-23 DIAGNOSIS — E0843 Diabetes mellitus due to underlying condition with diabetic autonomic (poly)neuropathy: Secondary | ICD-10-CM | POA: Diagnosis not present

## 2019-04-23 DIAGNOSIS — M7751 Other enthesopathy of right foot: Secondary | ICD-10-CM

## 2019-04-23 DIAGNOSIS — M778 Other enthesopathies, not elsewhere classified: Secondary | ICD-10-CM

## 2019-04-23 MED ORDER — GABAPENTIN 100 MG PO CAPS: 100.0000 mg | ORAL_CAPSULE | Freq: Three times a day (TID) | ORAL | 3 refills | Status: DC

## 2019-04-23 MED ORDER — MELOXICAM 15 MG PO TABS: 15.0000 mg | ORAL_TABLET | Freq: Every day | ORAL | 1 refills | Status: AC

## 2019-04-25 NOTE — Progress Notes (Signed)
   HPI: 53 y.o. male presenting today as a new patient with a chief complaint of intermittent bilateral foot pain, right greater than left, that began 3-4 months ago. She notes the pain of the right foot is located from the third toe laterally. Touching the feet increases the pain. She has been taking Advil for treatment. Patient is here for further evaluation and treatment.   Past Medical History:  Diagnosis Date  . Allergy   . Diabetes mellitus without complication ([place])   . Dysphagia    s/p esophageal dilatation  . GERD (gastroesophageal reflux disease)   . History of chickenpox   . History of kidney stones   . Hypercholesterolemia   . Hypertension   . Kidney stones 07/18/2013     Physical Exam: General: The patient is alert and oriented x3 in no acute distress.  Dermatology: Skin is warm, dry and supple bilateral lower extremities. Negative for open lesions or macerations.  Vascular: Palpable pedal pulses bilaterally. No edema or erythema noted. Capillary refill within normal limits.  Neurological: Epicritic and protective threshold diminished bilaterally.   Musculoskeletal Exam: Pain with palpation noted to the lateral right forefoot. Range of motion within normal limits to all pedal and ankle joints bilateral. Muscle strength 5/5 in all groups bilateral.   Radiographic Exam:  Normal osseous mineralization. Joint spaces preserved. No fracture/dislocation/boney destruction.    Assessment: 1. Right lateral forefoot capsulitis  2. Diabetes mellitus with polyneuropathy    Plan of Care:  1. Patient evaluated. X-Rays reviewed.  2. Injection of 0.5 mLs Celestone Soluspan injected into the lateral right forefoot.  3. Prescription for Meloxicam provided to patient. 4. Prescription for Gabapentin 100 mg TID provided to patient.  5. Return to clinic in 4 weeks.   Patient wears steel-toed boots. Works for [employer].       Edrick Kins, DPM [facility]  Dr. Edrick Kins, DPM    [address], [place] [address]                Office [phone]  Fax [phone]

## 2019-05-21 ENCOUNTER — Ambulatory Visit: Payer: BC Managed Care – PPO | Admitting: Podiatry

## 2019-05-21 ENCOUNTER — Other Ambulatory Visit: Payer: Self-pay

## 2019-05-21 DIAGNOSIS — E0843 Diabetes mellitus due to underlying condition with diabetic autonomic (poly)neuropathy: Secondary | ICD-10-CM

## 2019-05-21 DIAGNOSIS — M7751 Other enthesopathy of right foot: Secondary | ICD-10-CM | POA: Diagnosis not present

## 2019-05-21 DIAGNOSIS — M778 Other enthesopathies, not elsewhere classified: Secondary | ICD-10-CM

## 2019-05-21 NOTE — Progress Notes (Signed)
   HPI: 53 y.o. male with PMHx of DM presenting today for follow up evaluation of right foot pain. She reports some improvement. She has been taking the Meloxicam and Gabapentin which seem to be helping to alleviate the pain. She denies any worsening factors at this time. Patient is here for further evaluation and treatment.   Past Medical History:  Diagnosis Date  . Allergy   . Diabetes mellitus without complication ([place])   . Dysphagia    s/p esophageal dilatation  . GERD (gastroesophageal reflux disease)   . History of chickenpox   . History of kidney stones   . Hypercholesterolemia   . Hypertension   . Kidney stones 07/18/2013     Physical Exam: General: The patient is alert and oriented x3 in no acute distress.  Dermatology: Skin is warm, dry and supple bilateral lower extremities. Negative for open lesions or macerations.  Vascular: Palpable pedal pulses bilaterally. No edema or erythema noted. Capillary refill within normal limits.  Neurological: Epicritic and protective threshold diminished bilaterally.   Musculoskeletal Exam: Pain with palpation noted to the lateral right forefoot. Range of motion within normal limits to all pedal and ankle joints bilateral. Muscle strength 5/5 in all groups bilateral.   Assessment: 1. Right lateral forefoot capsulitis  2. Diabetes mellitus with polyneuropathy    Plan of Care:  1. Patient evaluated.  2. Injection of 0.5 mLs Celestone Soluspan injected into the lateral right forefoot.  3. Continue taking Meloxicam.  4. Continue taking Gabapentin three times daily.  5. Return to clinic in 6 weeks.   Patient wears steel-toed boots. Works for [employer].       Edrick Kins, DPM [facility]  Dr. Edrick Kins, DPM    [address], [place] [address]                Office [phone]  Fax [phone]

## 2019-05-27 ENCOUNTER — Other Ambulatory Visit: Payer: Self-pay | Admitting: Internal Medicine

## 2019-06-08 ENCOUNTER — Other Ambulatory Visit: Payer: Self-pay | Admitting: Internal Medicine

## 2019-07-02 ENCOUNTER — Other Ambulatory Visit: Payer: Self-pay

## 2019-07-02 ENCOUNTER — Encounter: Payer: Self-pay | Admitting: Podiatry

## 2019-07-02 ENCOUNTER — Ambulatory Visit: Payer: BC Managed Care – PPO | Admitting: Podiatry

## 2019-07-02 DIAGNOSIS — E0843 Diabetes mellitus due to underlying condition with diabetic autonomic (poly)neuropathy: Secondary | ICD-10-CM | POA: Diagnosis not present

## 2019-07-02 DIAGNOSIS — M778 Other enthesopathies, not elsewhere classified: Secondary | ICD-10-CM | POA: Diagnosis not present

## 2019-07-02 MED ORDER — GABAPENTIN 300 MG PO CAPS: 300.0000 mg | ORAL_CAPSULE | Freq: Three times a day (TID) | ORAL | 3 refills | Status: DC

## 2019-07-04 NOTE — Progress Notes (Signed)
   HPI: 53 y.o. male with PMHx of DM presenting today for follow up evaluation of right foot pain. He states he is doing well and has improved. He states his neuropathy is still bothersome. He has been taking Meloxicam and Gabapentin which help alleviate his symptoms. Patient is here for further evaluation and treatment.   Past Medical History:  Diagnosis Date  . Allergy   . Diabetes mellitus without complication ([facility])   . Dysphagia    s/p esophageal dilatation  . GERD (gastroesophageal reflux disease)   . History of chickenpox   . History of kidney stones   . Hypercholesterolemia   . Hypertension   . Kidney stones 07/18/2013     Physical Exam: General: The patient is alert and oriented x3 in no acute distress.  Dermatology: Skin is warm, dry and supple bilateral lower extremities. Negative for open lesions or macerations.  Vascular: Palpable pedal pulses bilaterally. No edema or erythema noted. Capillary refill within normal limits.  Neurological: Epicritic and protective threshold diminished bilaterally.   Musculoskeletal Exam: Pain with palpation noted to the lateral right forefoot. Range of motion within normal limits to all pedal and ankle joints bilateral. Muscle strength 5/5 in all groups bilateral.   Assessment: 1. Right lateral forefoot capsulitis  2. Diabetes mellitus with polyneuropathy    Plan of Care:  1. Patient evaluated.  2. Injection of 0.5 mLs Celestone Soluspan injected into the lateral right forefoot.  3. Continue taking Meloxicam daily.  4. Prescription for Gabapentin 300 mg three times daily provided to patient.  5. Appointment with Liliane Channel, Pedorthist, for custom molded orthotics.  6. Return to clinic as needed.   Patient wears steel-toed boots. Works for [employer].       Edrick Kins, DPM [facility]  Dr. Edrick Kins, DPM    [address], [place] [address]                Office [phone]  Fax [phone]

## 2019-07-23 ENCOUNTER — Other Ambulatory Visit: Payer: Self-pay | Admitting: Internal Medicine

## 2019-08-07 ENCOUNTER — Other Ambulatory Visit: Payer: BC Managed Care – PPO | Admitting: Orthotics

## 2019-08-09 ENCOUNTER — Other Ambulatory Visit: Payer: Self-pay | Admitting: Podiatry

## 2019-08-09 NOTE — Telephone Encounter (Signed)
Rx for meloxicam 15mg  sent to pharmacy [place]

## 2019-08-27 ENCOUNTER — Ambulatory Visit: Payer: BC Managed Care – PPO | Attending: Internal Medicine

## 2019-08-27 DIAGNOSIS — Z20822 Contact with and (suspected) exposure to covid-19: Secondary | ICD-10-CM

## 2019-08-28 ENCOUNTER — Other Ambulatory Visit: Payer: BC Managed Care – PPO | Admitting: Orthotics

## 2019-08-29 LAB — NOVEL CORONAVIRUS, NAA: SARS-CoV-2, NAA: NOT DETECTED

## 2019-09-11 ENCOUNTER — Ambulatory Visit (INDEPENDENT_AMBULATORY_CARE_PROVIDER_SITE_OTHER): Payer: BC Managed Care – PPO | Admitting: Orthotics

## 2019-09-11 ENCOUNTER — Other Ambulatory Visit: Payer: Self-pay

## 2019-09-11 DIAGNOSIS — E0843 Diabetes mellitus due to underlying condition with diabetic autonomic (poly)neuropathy: Secondary | ICD-10-CM

## 2019-09-11 DIAGNOSIS — M778 Other enthesopathies, not elsewhere classified: Secondary | ICD-10-CM | POA: Diagnosis not present

## 2019-09-11 DIAGNOSIS — G629 Polyneuropathy, unspecified: Secondary | ICD-10-CM

## 2019-09-11 NOTE — Progress Notes (Signed)
Patient came into today to be cast for Custom Foot Orthotics. Upon recommendation of Dr. Logan Bores Patient presents with Casulitis b/l Goals are arch support, forefoot cushioning Plan vendor [employer]

## 2019-09-27 ENCOUNTER — Other Ambulatory Visit: Payer: Self-pay

## 2019-09-27 ENCOUNTER — Ambulatory Visit: Payer: BC Managed Care – PPO | Admitting: Orthotics

## 2019-09-27 DIAGNOSIS — M778 Other enthesopathies, not elsewhere classified: Secondary | ICD-10-CM

## 2019-09-27 DIAGNOSIS — E0843 Diabetes mellitus due to underlying condition with diabetic autonomic (poly)neuropathy: Secondary | ICD-10-CM

## 2019-10-06 ENCOUNTER — Other Ambulatory Visit: Payer: Self-pay | Admitting: Internal Medicine

## 2019-11-30 ENCOUNTER — Other Ambulatory Visit: Payer: Self-pay | Admitting: Internal Medicine

## 2019-12-08 ENCOUNTER — Other Ambulatory Visit: Payer: Self-pay | Admitting: Podiatry

## 2019-12-09 NOTE — Telephone Encounter (Signed)
Rx for Gabapentin 300mg  sent to [facility] 

## 2020-02-22 ENCOUNTER — Other Ambulatory Visit: Payer: Self-pay | Admitting: Internal Medicine

## 2020-03-18 ENCOUNTER — Other Ambulatory Visit: Payer: Self-pay

## 2020-03-18 ENCOUNTER — Ambulatory Visit (INDEPENDENT_AMBULATORY_CARE_PROVIDER_SITE_OTHER): Payer: BC Managed Care – PPO | Admitting: Internal Medicine

## 2020-03-18 ENCOUNTER — Encounter: Payer: Self-pay | Admitting: Internal Medicine

## 2020-03-18 ENCOUNTER — Ambulatory Visit (INDEPENDENT_AMBULATORY_CARE_PROVIDER_SITE_OTHER): Payer: BC Managed Care – PPO

## 2020-03-18 VITALS — BP 122/76 | HR 90 | Temp 98.4°F | Ht 67.0 in | Wt 195.8 lb

## 2020-03-18 DIAGNOSIS — M79644 Pain in right finger(s): Secondary | ICD-10-CM

## 2020-03-18 DIAGNOSIS — E538 Deficiency of other specified B group vitamins: Secondary | ICD-10-CM

## 2020-03-18 DIAGNOSIS — Z1329 Encounter for screening for other suspected endocrine disorder: Secondary | ICD-10-CM

## 2020-03-18 DIAGNOSIS — E559 Vitamin D deficiency, unspecified: Secondary | ICD-10-CM | POA: Diagnosis not present

## 2020-03-18 DIAGNOSIS — E1159 Type 2 diabetes mellitus with other circulatory complications: Secondary | ICD-10-CM

## 2020-03-18 DIAGNOSIS — E1165 Type 2 diabetes mellitus with hyperglycemia: Secondary | ICD-10-CM

## 2020-03-18 DIAGNOSIS — E782 Mixed hyperlipidemia: Secondary | ICD-10-CM

## 2020-03-18 DIAGNOSIS — I10 Essential (primary) hypertension: Secondary | ICD-10-CM

## 2020-03-18 DIAGNOSIS — R7989 Other specified abnormal findings of blood chemistry: Secondary | ICD-10-CM

## 2020-03-18 DIAGNOSIS — Z720 Tobacco use: Secondary | ICD-10-CM | POA: Insufficient documentation

## 2020-03-18 DIAGNOSIS — Z125 Encounter for screening for malignant neoplasm of prostate: Secondary | ICD-10-CM

## 2020-03-18 DIAGNOSIS — I152 Hypertension secondary to endocrine disorders: Secondary | ICD-10-CM | POA: Insufficient documentation

## 2020-03-18 LAB — COMPREHENSIVE METABOLIC PANEL
ALT: 51 U/L (ref 0–53)
AST: 31 U/L (ref 0–37)
Albumin: 4.6 g/dL (ref 3.5–5.2)
Alkaline Phosphatase: 76 U/L (ref 39–117)
BUN: 17 mg/dL (ref 6–23)
CO2: 28 mEq/L (ref 19–32)
Calcium: 10.3 mg/dL (ref 8.4–10.5)
Chloride: 102 mEq/L (ref 96–112)
Creatinine, Ser: 1.03 mg/dL (ref 0.40–1.50)
GFR: 74.8 mL/min (ref >60.00)
Glucose, Bld: 266 mg/dL — ABNORMAL HIGH (ref 70–99)
Potassium: 4.7 mEq/L (ref 3.5–5.1)
Sodium: 140 mEq/L (ref 135–145)
Total Bilirubin: 0.5 mg/dL (ref 0.2–1.2)
Total Protein: 7.1 g/dL (ref 6.0–8.3)

## 2020-03-18 LAB — URIC ACID: Uric Acid, Serum: 4.9 mg/dL (ref 4.0–7.8)

## 2020-03-18 LAB — CBC WITH DIFFERENTIAL/PLATELET
Basophils Absolute: 0.1 10*3/uL (ref 0.0–0.1)
Basophils Relative: 1 % (ref 0.0–3.0)
Eosinophils Absolute: 0.3 10*3/uL (ref 0.0–0.7)
Eosinophils Relative: 4.2 % (ref 0.0–5.0)
HCT: 38.7 % — ABNORMAL LOW (ref 39.0–52.0)
Hemoglobin: 13.3 g/dL (ref 13.0–17.0)
Lymphocytes Relative: 16.7 % (ref 12.0–46.0)
Lymphs Abs: 1.2 10*3/uL (ref 0.7–4.0)
MCHC: 34.3 g/dL (ref 30.0–36.0)
MCV: 98.4 fl (ref 78.0–100.0)
Monocytes Absolute: 0.5 10*3/uL (ref 0.1–1.0)
Monocytes Relative: 7 % (ref 3.0–12.0)
Neutro Abs: 5.1 10*3/uL (ref 1.4–7.7)
Neutrophils Relative %: 71.1 % (ref 43.0–77.0)
Platelets: 208 10*3/uL (ref 150.0–400.0)
RBC: 3.93 Mil/uL — ABNORMAL LOW (ref 4.22–5.81)
RDW: 13.6 % (ref 11.5–15.5)
WBC: 7.2 10*3/uL (ref 4.0–10.5)

## 2020-03-18 LAB — PSA: PSA: 0.22 ng/mL (ref 0.10–4.00)

## 2020-03-18 LAB — TSH: TSH: 1.4 u[IU]/mL (ref 0.35–4.50)

## 2020-03-18 LAB — VITAMIN B12: Vitamin B-12: 389 pg/mL (ref 211–911)

## 2020-03-18 LAB — VITAMIN D 25 HYDROXY (VIT D DEFICIENCY, FRACTURES): VITD: 32.57 ng/mL (ref 30.00–100.00)

## 2020-03-18 MED ORDER — PREDNISONE 20 MG PO TABS: ORAL_TABLET | ORAL | 0 refills | Status: DC

## 2020-03-18 NOTE — Progress Notes (Signed)
Chief Complaint  Patient presents with  . Hand Problem   Acute visit  1. 3 weeks right knuckle pain 9/10 today takes 800 mg advil x 1 x daily and pain and swelling of hand no prior injury but right index is swollen he drinks 1-2 beers qd and wonders if gout. Had reduced ROM and cant touch right index finger to thumb  His daughter having similar sx's as wll  2. Elevated lfts lost to follow up no f/u 03/2019 with PCP due to pandemic is drinking 1-2 beers each day  3. dM 2 with A1C 8.5 last checked will get pt back for fasting labs   Review of Systems  Respiratory: Negative for shortness of breath.   Cardiovascular: Negative for chest pain.  Musculoskeletal: Positive for joint pain.   Past Medical History:  Diagnosis Date  . Allergy   . Diabetes mellitus without complication ([facility])   . Dysphagia    s/p esophageal dilatation  . GERD (gastroesophageal reflux disease)   . History of chickenpox   . History of kidney stones   . Hypercholesterolemia   . Hypertension   . Kidney stones 07/18/2013   Past Surgical History:  Procedure Laterality Date  . ESOPHAGEAL DILATION    . TONSILLECTOMY  1970   Family History  Problem Relation Age of Onset  . Hyperlipidemia Mother   . Hypertension Father   . Arthritis Maternal Grandmother   . Diabetes Maternal Grandmother   . Lung cancer Maternal Grandfather   . Heart disease Paternal Grandfather   . Stroke Paternal Grandfather    Social History   Socioeconomic History  . Marital status: Married    Spouse name: Diane  . Number of children: 2  . Years of education: Not on file  . Highest education level: Not on file  Occupational History    Employer:  [employer]  Tobacco Use  . Smoking status: Never Smoker  . Smokeless tobacco: Former Neurosurgeon    Types: Chew  . Tobacco comment: [place]  Substance and Sexual Activity  . Alcohol use: Yes    Alcohol/week: 0.0 standard drinks    Comment: drinks 3-4 beers per day  . Drug use: No  .  Sexual activity: Yes  Other Topics Concern  . Not on file  Social History Narrative  . Not on file   Social Determinants of Health   Financial Resource Strain:   . Difficulty of Paying Living Expenses:   Food Insecurity:   . Worried About Programme researcher, broadcasting/film/video in the Last Year:   . Barista in the Last Year:   Transportation Needs:   . Freight forwarder (Medical):   Marland Kitchen Lack of Transportation (Non-Medical):   Physical Activity:   . Days of Exercise per Week:   . Minutes of Exercise per Session:   Stress:   . Feeling of Stress :   Social Connections:   . Frequency of Communication with Friends and Family:   . Frequency of Social Gatherings with Friends and Family:   . Attends Religious Services:   . Active Member of Clubs or Organizations:   . Attends Banker Meetings:   Marland Kitchen Marital Status:   Intimate Partner Violence:   . Fear of Current or Ex-Partner:   . Emotionally Abused:   Marland Kitchen Physically Abused:   . Sexually Abused:    Current Meds  Medication Sig  . aspirin (ASPIRIN CHILDRENS) 81 MG chewable tablet Chew 1 tablet (81 mg total) by  mouth daily.  Marland Kitchen gabapentin (NEURONTIN) 300 MG capsule TAKE 1 CAPSULE BY MOUTH THREE TIMES A DAY  . JARDIANCE 25 MG TABS tablet TAKE 1 TABLET BY MOUTH EVERY DAY  . lisinopril (ZESTRIL) 20 MG tablet TAKE 1 TABLET BY MOUTH EVERY DAY  . meloxicam (MOBIC) 15 MG tablet TAKE 1 TABLET BY MOUTH EVERY DAY  . omeprazole (PRILOSEC) 20 MG capsule Take 20 mg by mouth daily.  Marland Kitchen OZEMPIC, 1 MG/DOSE, 2 MG/1.5ML SOPN INJECT 1 MG INTO THE SKIN ONCE A WEEK.  . rosuvastatin (CRESTOR) 20 MG tablet Take 1 tablet (20 mg total) by mouth daily.   No Known Allergies No results found for this or any previous visit (from the past 2160 hour(s)). Objective  Body mass index is 30.67 kg/m. Wt Readings from Last 3 Encounters:  03/18/20 195 lb 12.8 oz (88.8 kg)  08/27/18 189 lb 3.2 oz (85.8 kg)  06/01/18 182 lb (82.6 kg)   Temp Readings from Last 3  Encounters:  03/18/20 98.4 F (36.9 C) (Oral)  08/27/18 98.5 F (36.9 C) (Oral)  06/01/18 98.4 F (36.9 C) (Oral)   BP Readings from Last 3 Encounters:  03/18/20 122/76  08/27/18 126/70  06/01/18 (!) 104/58   Pulse Readings from Last 3 Encounters:  03/18/20 90  08/27/18 89  06/01/18 94    Physical Exam Constitutional:      Appearance: Normal appearance. He is well-developed and well-groomed.  HENT:     Head: Normocephalic and atraumatic.  Eyes:     Conjunctiva/sclera: Conjunctivae normal.     Pupils: Pupils are equal, round, and reactive to light.  Cardiovascular:     Rate and Rhythm: Normal rate and regular rhythm.     Heart sounds: Normal heart sounds. No murmur heard.   Pulmonary:     Effort: Pulmonary effort is normal.     Breath sounds: Normal breath sounds.  Musculoskeletal:       Arms:  Skin:    General: Skin is warm and dry.  Neurological:     General: No focal deficit present.     Mental Status: He is alert and oriented to person, place, and time. Mental status is at baseline.     Gait: Gait normal.  Psychiatric:        Attention and Perception: Attention and perception normal.        Mood and Affect: Mood and affect normal.        Speech: Speech normal.        Behavior: Behavior normal. Behavior is cooperative.        Thought Content: Thought content normal.        Cognition and Memory: Cognition and memory normal.        Judgment: Judgment normal.     Assessment  Plan  Pain of finger of right hand - Plan: DG Hand Complete Right, Comprehensive metabolic panel, CBC with Differential/Platelet, Uric acid, predniSONE (DELTASONE) 20 MG tablet-taper  Vitamin D deficiency - Plan: Vitamin D (25 hydroxy)  B12 deficiency - Plan: B12  Type 2 diabetes mellitus with hyperglycemia, uncontrolled with HTN controlled - Plan: Urinalysis, Routine w reflex microscopic, Microalbumin / creatinine urine ratio Check lipid and A1C in 1-2 weeks and f/u iwith PCP  Cont  meds   Tobacco abuse  rec smoking cessation   Elevated lfts  Check cmet Provider: Dr. French Ana McLean-Scoc[facility]

## 2020-03-18 NOTE — Patient Instructions (Addendum)
voltaren gel over the counter for pain 4x per day  Prednisone    Hand Exercises Hand exercises can be helpful for almost anyone. These exercises can strengthen the hands, improve flexibility and movement, and increase blood flow to the hands. These results can make work and daily tasks easier. Hand exercises can be especially helpful for people who have joint pain from arthritis or have nerve damage from overuse (carpal tunnel syndrome). These exercises can also help people who have injured a hand. Exercises Most of these hand exercises are gentle stretching and motion exercises. It is usually safe to do them often throughout the day. Warming up your hands before exercise may help to reduce stiffness. You can do this with gentle massage or by placing your hands in warm water for 10-15 minutes. It is normal to feel some stretching, pulling, tightness, or mild discomfort as you begin new exercises. This will gradually improve. Stop an exercise right away if you feel sudden, severe pain or your pain gets worse. Ask your health care provider which exercises are best for you. Knuckle bend or "claw" fist 1. Stand or sit with your arm, hand, and all five fingers pointed straight up. Make sure to keep your wrist straight during the exercise. 2. Gently bend your fingers down toward your palm until the tips of your fingers are touching the top of your palm. Keep your big knuckle straight and just bend the small knuckles in your fingers. 3. Hold this position for __________ seconds. 4. Straighten (extend) your fingers back to the starting position. Repeat this exercise 5-10 times with each hand. Full finger fist 1. Stand or sit with your arm, hand, and all five fingers pointed straight up. Make sure to keep your wrist straight during the exercise. 2. Gently bend your fingers into your palm until the tips of your fingers are touching the middle of your palm. 3. Hold this position for __________  seconds. 4. Extend your fingers back to the starting position, stretching every joint fully. Repeat this exercise 5-10 times with each hand. Straight fist 1. Stand or sit with your arm, hand, and all five fingers pointed straight up. Make sure to keep your wrist straight during the exercise. 2. Gently bend your fingers at the big knuckle, where your fingers meet your hand, and the middle knuckle. Keep the knuckle at the tips of your fingers straight and try to touch the bottom of your palm. 3. Hold this position for __________ seconds. 4. Extend your fingers back to the starting position, stretching every joint fully. Repeat this exercise 5-10 times with each hand. Tabletop 1. Stand or sit with your arm, hand, and all five fingers pointed straight up. Make sure to keep your wrist straight during the exercise. 2. Gently bend your fingers at the big knuckle, where your fingers meet your hand, as far down as you can while keeping the small knuckles in your fingers straight. Think of forming a tabletop with your fingers. 3. Hold this position for __________ seconds. 4. Extend your fingers back to the starting position, stretching every joint fully. Repeat this exercise 5-10 times with each hand. Finger spread 1. Place your hand flat on a table with your palm facing down. Make sure your wrist stays straight as you do this exercise. 2. Spread your fingers and thumb apart from each other as far as you can until you feel a gentle stretch. Hold this position for __________ seconds. 3. Bring your fingers and thumb tight together  again. Hold this position for __________ seconds. Repeat this exercise 5-10 times with each hand. Making circles 1. Stand or sit with your arm, hand, and all five fingers pointed straight up. Make sure to keep your wrist straight during the exercise. 2. Make a circle by touching the tip of your thumb to the tip of your index finger. 3. Hold for __________ seconds. Then open your  hand wide. 4. Repeat this motion with your thumb and each finger on your hand. Repeat this exercise 5-10 times with each hand. Thumb motion 1. Sit with your forearm resting on a table and your wrist straight. Your thumb should be facing up toward the ceiling. Keep your fingers relaxed as you move your thumb. 2. Lift your thumb up as high as you can toward the ceiling. Hold for __________ seconds. 3. Bend your thumb across your palm as far as you can, reaching the tip of your thumb for the small finger (pinkie) side of your palm. Hold for __________ seconds. Repeat this exercise 5-10 times with each hand. Grip strengthening  1. Hold a stress ball or other soft ball in the middle of your hand. 2. Slowly increase the pressure, squeezing the ball as much as you can without causing pain. Think of bringing the tips of your fingers into the middle of your palm. All of your finger joints should bend when doing this exercise. 3. Hold your squeeze for __________ seconds, then relax. Repeat this exercise 5-10 times with each hand. Contact a health care provider if:  Your hand pain or discomfort gets much worse when you do an exercise.  Your hand pain or discomfort does not improve within 2 hours after you exercise. If you have any of these problems, stop doing these exercises right away. Do not do them again unless your health care provider says that you can. Get help right away if:  You develop sudden, severe hand pain or swelling. If this happens, stop doing these exercises right away. Do not do them again unless your health care provider says that you can. This information is not intended to replace advice given to you by your health care provider. Make sure you discuss any questions you have with your health care provider. Document Revised: 11/29/2018 Document Reviewed: 08/09/2018 Elsevier Patient Education  2020 [employer]  Arthritis Arthritis is a term that is commonly used to refer to  joint pain or joint disease. There are more than 100 types of arthritis. What are the causes? The most common cause of this condition is wear and tear of a joint. Other causes include:  Gout.  Inflammation of a joint.  An infection of a joint.  Sprains and other injuries near the joint.  A reaction to medicines or drugs, or an allergic reaction. In some cases, the cause may not be known. What are the signs or symptoms? The main symptom of this condition is pain in the joint during movement. Other symptoms include:  Redness, swelling, or stiffness at a joint.  Warmth coming from the joint.  Fever.  Overall feeling of illness. How is this diagnosed? This condition may be diagnosed with a physical exam and tests, including:  Blood tests.  Urine tests.  Imaging tests, such as X-rays, an MRI, or a CT scan. Sometimes, fluid is removed from a joint for testing. How is this treated? This condition may be treated with:  Treatment of the cause, if it is known.  Rest.  Raising (elevating) the joint.  Applying cold or hot packs to the joint.  Medicines to improve symptoms and reduce inflammation.  Injections of a steroid such as cortisone into the joint to help reduce pain and inflammation. Depending on the cause of your arthritis, you may need to make lifestyle changes to reduce stress on your joint. Changes may include:  Exercising more.  Losing weight. Follow these instructions at home: Medicines  Take over-the-counter and prescription medicines only as told by your health care provider.  Do not take aspirin to relieve pain if your health care provider thinks that gout may be causing your pain. Activity  Rest your joint if told by your health care provider. Rest is important when your disease is active and your joint feels painful, swollen, or stiff.  Avoid activities that make the pain worse. It is important to balance activity with rest.  Exercise your joint  regularly with range-of-motion exercises as told by your health care provider. Try doing low-impact exercise, such as: ? Swimming. ? Water aerobics. ? Biking. ? Walking. Managing pain, stiffness, and swelling      If directed, put ice on the joint. ? Put ice in a plastic bag. ? Place a towel between your skin and the bag. ? Leave the ice on for 20 minutes, 2-3 times per day.  If your joint is swollen, raise (elevate) it above the level of your heart if directed by your health care provider.  If your joint feels stiff in the morning, try taking a warm shower.  If directed, apply heat to the affected area as often as told by your health care provider. Use the heat source that your health care provider recommends, such as a moist heat pack or a heating pad. If you have diabetes, do not apply heat without permission from your health care provider. To apply heat: ? Place a towel between your skin and the heat source. ? Leave the heat on for 20-30 minutes. ? Remove the heat if your skin turns bright red. This is especially important if you are unable to feel pain, heat, or cold. You may have a greater risk of getting burned. General instructions  Do not use any products that contain nicotine or tobacco, such as cigarettes, e-cigarettes, and chewing tobacco. If you need help quitting, ask your health care provider.  Keep all follow-up visits as told by your health care provider. This is important. Contact a health care provider if:  The pain gets worse.  You have a fever. Get help right away if:  You develop severe joint pain, swelling, or redness.  Many joints become painful and swollen.  You develop severe back pain.  You develop severe weakness in your leg.  You cannot control your bladder or bowels. Summary  Arthritis is a term that is commonly used to refer to joint pain or joint disease. There are more than 100 types of arthritis.  The most common cause of this  condition is wear and tear of a joint. Other causes include gout, inflammation or infection of the joint, sprains, or allergies.  Symptoms of this condition include redness, swelling, or stiffness of the joint. Other symptoms include warmth, fever, or feeling ill.  This condition is treated with rest, elevation, medicines, and applying cold or hot packs.  Follow your health care provider's instructions about medicines, activity, exercises, and other home care treatments. This information is not intended to replace advice given to you by your health care provider. Make sure you discuss any  questions you have with your health care provider. Document Revised: 07/16/2018 Document Reviewed: 07/16/2018 Elsevier Patient Education  2020 [employer].

## 2020-03-18 NOTE — Progress Notes (Signed)
Patient presenting with swelling and pain of the right hand pointer finger. No known injuries, changes, or previous similar symptoms. Onset of swelling and pain was 3 weeks ago. Patient can not completely bend the finger either.  States he has not hit, jammed, slept on, hurt, or fallen on this finger. Patient has never had random swelling anywhere on the body before.   Patient states his daughter is having similar symptoms with her fingers as well.

## 2020-03-19 LAB — URINALYSIS, ROUTINE W REFLEX MICROSCOPIC
Bilirubin Urine: NEGATIVE
Hgb urine dipstick: NEGATIVE
Leukocytes,Ua: NEGATIVE
Nitrite: NEGATIVE
Protein, ur: NEGATIVE
Specific Gravity, Urine: 1.037 — ABNORMAL HIGH (ref 1.001–1.03)
pH: <=5 (ref 5.0–8.0)

## 2020-03-19 LAB — MICROALBUMIN / CREATININE URINE RATIO
Creatinine, Urine: 51 mg/dL (ref 20–320)
Microalb Creat Ratio: 16 mcg/mg creat (ref <30)
Microalb, Ur: 0.8 mg/dL

## 2020-03-22 ENCOUNTER — Other Ambulatory Visit: Payer: Self-pay | Admitting: Internal Medicine

## 2020-03-26 ENCOUNTER — Other Ambulatory Visit: Payer: Self-pay

## 2020-03-26 ENCOUNTER — Other Ambulatory Visit (INDEPENDENT_AMBULATORY_CARE_PROVIDER_SITE_OTHER): Payer: BC Managed Care – PPO

## 2020-03-26 DIAGNOSIS — E1165 Type 2 diabetes mellitus with hyperglycemia: Secondary | ICD-10-CM | POA: Diagnosis not present

## 2020-03-26 DIAGNOSIS — E782 Mixed hyperlipidemia: Secondary | ICD-10-CM | POA: Diagnosis not present

## 2020-03-26 LAB — LDL CHOLESTEROL, DIRECT: Direct LDL: 90 mg/dL

## 2020-03-26 LAB — LIPID PANEL
Cholesterol: 215 mg/dL — ABNORMAL HIGH (ref 0–200)
HDL: 68.6 mg/dL (ref >39.00)
NonHDL: 146.31
Total CHOL/HDL Ratio: 3
Triglycerides: 361 mg/dL — ABNORMAL HIGH (ref 0.0–149.0)
VLDL: 72.2 mg/dL — ABNORMAL HIGH (ref 0.0–40.0)

## 2020-03-26 LAB — HEMOGLOBIN A1C: Hgb A1c MFr Bld: 9.3 % — ABNORMAL HIGH (ref 4.6–6.5)

## 2020-04-14 ENCOUNTER — Other Ambulatory Visit: Payer: Self-pay

## 2020-04-14 ENCOUNTER — Ambulatory Visit (INDEPENDENT_AMBULATORY_CARE_PROVIDER_SITE_OTHER): Payer: BC Managed Care – PPO | Admitting: Internal Medicine

## 2020-04-14 DIAGNOSIS — E7800 Pure hypercholesterolemia, unspecified: Secondary | ICD-10-CM

## 2020-04-14 DIAGNOSIS — F1010 Alcohol abuse, uncomplicated: Secondary | ICD-10-CM

## 2020-04-14 DIAGNOSIS — R7989 Other specified abnormal findings of blood chemistry: Secondary | ICD-10-CM | POA: Diagnosis not present

## 2020-04-14 DIAGNOSIS — I10 Essential (primary) hypertension: Secondary | ICD-10-CM

## 2020-04-14 DIAGNOSIS — M79644 Pain in right finger(s): Secondary | ICD-10-CM

## 2020-04-14 DIAGNOSIS — K219 Gastro-esophageal reflux disease without esophagitis: Secondary | ICD-10-CM | POA: Diagnosis not present

## 2020-04-14 DIAGNOSIS — E1165 Type 2 diabetes mellitus with hyperglycemia: Secondary | ICD-10-CM

## 2020-04-14 MED ORDER — ROSUVASTATIN CALCIUM 40 MG PO TABS: 40.0000 mg | ORAL_TABLET | Freq: Every day | ORAL | 2 refills | Status: DC

## 2020-04-14 NOTE — Progress Notes (Signed)
[name]: Phillip Shepard, male   DOB: 06/30/1966, 54 y.o.   MRN: [id number]   Subjective:    [name]: Phillip Shepard, male    DOB: 1965/10/27, 54 y.o.   MRN: [id number]  HPI This visit occurred during the SARS-CoV-2 public health emergency.  Safety protocols were in place, including screening questions prior to the visit, additional usage of staff PPE, and extensive cleaning of exam room while observing appropriate contact time as indicated for disinfecting solutions.  Patient here for a scheduled follow up. He was recently evaluated by Dr McLean-Scocuzza on 03/18/20 for right index finger pain and swelling.  Placed on prednisone.  With persistent limited rom.  Unable to fully flex finger.  Denies any known injury or trauma.  Discussed referral to ortho given persistent pain and limited rom.  He tries to stay active.  No chest pain or sob reported.  No abdominal pain.  Bowels moving.  Discussed diet and exercise.  Discussed decreasing alcohol intake.  Not checking sugars regularly.  States am sugars averaging 190.  PM sugars - 140-200.    Past Medical History:  Diagnosis Date  . Allergy   . Diabetes mellitus without complication (HCC)   . Dysphagia    s/p esophageal dilatation  . GERD (gastroesophageal reflux disease)   . History of chickenpox   . History of kidney stones   . Hypercholesterolemia   . Hypertension   . Kidney stones 07/18/2013   Past Surgical History:  Procedure Laterality Date  . ESOPHAGEAL DILATION    . TONSILLECTOMY  1970   Family History  Problem Relation Age of Onset  . Hyperlipidemia Mother   . Hypertension Father   . Arthritis Maternal Grandmother   . Diabetes Maternal Grandmother   . Lung cancer Maternal Grandfather   . Heart disease Paternal Grandfather   . Stroke Paternal Grandfather    Social History   Socioeconomic History  . Marital status: Married    Spouse name: Diane  . Number of children: 2  . Years of education: Not on file  .  Highest education level: Not on file  Occupational History    Employer: [employer]  Tobacco Use  . Smoking status: Never Smoker  . Smokeless tobacco: Former Neurosurgeon    Types: Chew  . Tobacco comment: [place]  Substance and Sexual Activity  . Alcohol use: Yes    Alcohol/week: 0.0 standard drinks    Comment: drinks 3-4 beers per day  . Drug use: No  . Sexual activity: Yes  Other Topics Concern  . Not on file  Social History Narrative  . Not on file   Social Determinants of Health   Financial Resource Strain:   . Difficulty of Paying Living Expenses: Not on file  Food Insecurity:   . Worried About Programme researcher, broadcasting/film/video in the Last Year: Not on file  . Ran Out of Food in the Last Year: Not on file  Transportation Needs:   . Lack of Transportation (Medical): Not on file  . Lack of Transportation (Non-Medical): Not on file  Physical Activity:   . Days of Exercise per Week: Not on file  . Minutes of Exercise per Session: Not on file  Stress:   . Feeling of Stress : Not on file  Social Connections:   . Frequency of Communication with Friends and Family: Not on file  . Frequency of Social Gatherings with Friends and Family: Not on file  . Attends Religious Services: Not on  file  . Active Member of Clubs or Organizations: Not on file  . Attends Banker Meetings: Not on file  . Marital Status: Not on file    Outpatient Encounter Medications as of 04/14/2020  Medication Sig  . aspirin (ASPIRIN CHILDRENS) 81 MG chewable tablet Chew 1 tablet (81 mg total) by mouth daily.  Marland Kitchen gabapentin (NEURONTIN) 300 MG capsule TAKE 1 CAPSULE BY MOUTH THREE TIMES A DAY  . JARDIANCE 25 MG TABS tablet TAKE 1 TABLET BY MOUTH EVERY DAY  . lisinopril (ZESTRIL) 20 MG tablet TAKE 1 TABLET BY MOUTH EVERY DAY  . meloxicam (MOBIC) 15 MG tablet TAKE 1 TABLET BY MOUTH EVERY DAY  . omeprazole (PRILOSEC) 20 MG capsule Take 20 mg by mouth daily.  Marland Kitchen OZEMPIC, 1 MG/DOSE, 2 MG/1.5ML SOPN INJECT 1 MG  INTO THE SKIN ONCE A WEEK.  . predniSONE (DELTASONE) 20 MG tablet 40 mg x 3 days with food in the am, 20 mg x 2 days in the am with food, 10 mg x 2 days then stop  . rosuvastatin (CRESTOR) 40 MG tablet Take 1 tablet (40 mg total) by mouth daily.  . [DISCONTINUED] rosuvastatin (CRESTOR) 20 MG tablet Take 1 tablet (20 mg total) by mouth daily.   No facility-administered encounter medications on file as of 04/14/2020.    Review of Systems  Constitutional: Negative for appetite change and unexpected weight change.  HENT: Negative for congestion and sinus pressure.   Respiratory: Negative for cough, chest tightness and shortness of breath.   Cardiovascular: Negative for chest pain, palpitations and leg swelling.  Gastrointestinal: Negative for abdominal pain, diarrhea, nausea and vomiting.  Genitourinary: Negative for difficulty urinating and dysuria.  Musculoskeletal: Negative for myalgias.       Increased right index finger pain and swelling.    Skin: Negative for color change and rash.  Neurological: Negative for dizziness, light-headedness and headaches.  Psychiatric/Behavioral: Negative for agitation and dysphoric mood.       Objective:    Physical Exam Vitals reviewed.  Constitutional:      General: He is not in acute distress.    Appearance: Normal appearance. He is well-developed.  HENT:     Head: Normocephalic and atraumatic.     Right Ear: External ear normal.     Left Ear: External ear normal.  Eyes:     General: No scleral icterus.       Right eye: No discharge.        Left eye: No discharge.     Conjunctiva/sclera: Conjunctivae normal.  Cardiovascular:     Rate and Rhythm: Normal rate and regular rhythm.  Pulmonary:     Effort: Pulmonary effort is normal. No respiratory distress.     Breath sounds: Normal breath sounds.  Abdominal:     General: Bowel sounds are normal.     Palpations: Abdomen is soft.     Tenderness: There is no abdominal tenderness.    Musculoskeletal:        General: No swelling or tenderness.     Cervical back: Neck supple. No tenderness.     Comments: Increased pain - right index finger - with flexion.  Limited rom with flexion.   Lymphadenopathy:     Cervical: No cervical adenopathy.  Skin:    Findings: No erythema or rash.  Neurological:     Mental Status: He is alert.  Psychiatric:        Mood and Affect: Mood normal.  Behavior: Behavior normal.     BP 138/70   Pulse 80   Temp 98.4 F (36.9 C) (Oral)   Resp 16   Ht 5\' 7"  (1.702 m)   Wt 196 lb (88.9 kg)   SpO2 99%   BMI 30.70 kg/m  Wt Readings from Last 3 Encounters:  04/14/20 196 lb (88.9 kg)  03/18/20 195 lb 12.8 oz (88.8 kg)  08/27/18 189 lb 3.2 oz (85.8 kg)     Lab Results  Component Value Date   WBC 7.2 03/18/2020   HGB 13.3 03/18/2020   HCT 38.7 (L) 03/18/2020   PLT 208.0 03/18/2020   GLUCOSE 266 (H) 03/18/2020   CHOL 215 (H) 03/26/2020   TRIG 361.0 (H) 03/26/2020   HDL 68.60 03/26/2020   LDLDIRECT 90.0 03/26/2020   LDLCALC 129 (H) 02/03/2014   ALT 51 03/18/2020   AST 31 03/18/2020   NA 140 03/18/2020   K 4.7 03/18/2020   CL 102 03/18/2020   CREATININE 1.03 03/18/2020   BUN 17 03/18/2020   CO2 28 03/18/2020   TSH 1.40 03/18/2020   PSA 0.22 03/18/2020   HGBA1C 9.3 (H) 03/26/2020   MICROALBUR 0.8 03/18/2020        Assessment & Plan:   Problem List Items Addressed This Visit    Hypercholesterolemia    On crestor.  Triglycerides improved.  Increase crestor to 40mg  q day.  Follow lipid panel and liver function tests.       Relevant Medications   rosuvastatin (CRESTOR) 40 MG tablet   GERD (gastroesophageal reflux disease)    Controlled on prilosec.       Finger pain, right    Persistent pain and swelling right index finger.  Limited rom.  Refer to ortho for further evaluation and treatment.  Previously treated with prednisone.       Relevant Orders   Ambulatory referral to Orthopedic Surgery   Essential  hypertension, benign    Blood pressure as outlined.  Continue lisinopril.  Follow pressures.  Follow metabolic panel.        Relevant Medications   rosuvastatin (CRESTOR) 40 MG tablet   Elevated liver function tests    Has declined abdominal ultrasound.  03/18/20 - liver function tests wnl.  Continue to work on lowering triglycerides.  Need to get sugars under better control.  Follow.        Relevant Orders   Hepatic function panel   Diabetes (HCC)    a1c on recent check elevated.  Discussed with him today.  He is taking jardiance and ozempic.  Had intolerance to metformin.  Discussed low carb diet and exercise.  Discussed given increasing a1c - starting insulin.  Will arrange f/u with Catie.        Relevant Medications   rosuvastatin (CRESTOR) 40 MG tablet   Other Relevant Orders   Ambulatory referral to Chronic Care Management Services   Alcohol abuse    Discussed the need to decrease alcohol intake.            , MD

## 2020-04-20 ENCOUNTER — Encounter: Payer: Self-pay | Admitting: Internal Medicine

## 2020-04-20 DIAGNOSIS — M79644 Pain in right finger(s): Secondary | ICD-10-CM | POA: Insufficient documentation

## 2020-04-20 NOTE — Assessment & Plan Note (Signed)
On crestor.  Triglycerides improved.  Increase crestor to 40mg  q day.  Follow lipid panel and liver function tests.

## 2020-04-20 NOTE — Assessment & Plan Note (Signed)
Persistent pain and swelling right index finger.  Limited rom.  Refer to ortho for further evaluation and treatment.  Previously treated with prednisone.

## 2020-04-20 NOTE — Assessment & Plan Note (Signed)
Discussed the need to decrease alcohol intake.   

## 2020-04-20 NOTE — Assessment & Plan Note (Signed)
a1c on recent check elevated.  Discussed with him today.  He is taking jardiance and ozempic.  Had intolerance to metformin.  Discussed low carb diet and exercise.  Discussed given increasing a1c - starting insulin.  Will arrange f/u with Catie.

## 2020-04-20 NOTE — Assessment & Plan Note (Signed)
Controlled on prilosec.   

## 2020-04-20 NOTE — Assessment & Plan Note (Signed)
Has declined abdominal ultrasound.  03/18/20 - liver function tests wnl.  Continue to work on lowering triglycerides.  Need to get sugars under better control.  Follow.

## 2020-04-20 NOTE — Assessment & Plan Note (Signed)
Blood pressure as outlined.  Continue lisinopril.  Follow pressures.  Follow metabolic panel.   

## 2020-04-21 ENCOUNTER — Telehealth: Payer: Self-pay

## 2020-04-21 NOTE — Chronic Care Management (AMB) (Signed)
  Care Management   Note  04/21/2020 Name: GORDAN GRELL MRN: [id number] DOB: Mar 07, 1966  Phillip Shepard is a 54 y.o. year old male who is a primary care patient of [name] Tama, MD. I reached out to Francoise Ceo by phone today in response to a referral sent by Mr. SHILO PAUWELS PCP Dr. [name] Millry.    Mr. Pine was given information about care management services today including:  1. Care management services include personalized support from designated clinical staff supervised by his physician, including individualized plan of care and coordination with other care providers 2. 24/7 contact phone numbers for assistance for urgent and routine care needs. 3. The patient may stop care management services at any time by phone call to the office staff.  Patient agreed to services and verbal consent obtained.   Follow up plan: Telephone appointment with care management team member scheduled for:05/19/2020  Penne Lash, RMA Care Guide, Embedded Care Coordination [facility]  [place], [place] [address] Direct Dial: [phone] [email or web address]

## 2020-04-29 ENCOUNTER — Other Ambulatory Visit: Payer: Self-pay

## 2020-04-29 DIAGNOSIS — Z20822 Contact with and (suspected) exposure to covid-19: Secondary | ICD-10-CM

## 2020-05-01 LAB — SARS-COV-2, NAA 2 DAY TAT

## 2020-05-01 LAB — NOVEL CORONAVIRUS, NAA: SARS-CoV-2, NAA: NOT DETECTED

## 2020-05-06 ENCOUNTER — Other Ambulatory Visit: Payer: BC Managed Care – PPO

## 2020-05-06 ENCOUNTER — Other Ambulatory Visit: Payer: Self-pay

## 2020-05-06 DIAGNOSIS — Z20822 Contact with and (suspected) exposure to covid-19: Secondary | ICD-10-CM

## 2020-05-07 LAB — SARS-COV-2, NAA 2 DAY TAT

## 2020-05-07 LAB — NOVEL CORONAVIRUS, NAA: SARS-CoV-2, NAA: NOT DETECTED

## 2020-05-12 ENCOUNTER — Other Ambulatory Visit: Payer: Self-pay | Admitting: Podiatry

## 2020-05-13 ENCOUNTER — Other Ambulatory Visit: Payer: BC Managed Care – PPO

## 2020-05-13 ENCOUNTER — Other Ambulatory Visit (INDEPENDENT_AMBULATORY_CARE_PROVIDER_SITE_OTHER): Payer: BC Managed Care – PPO

## 2020-05-13 ENCOUNTER — Other Ambulatory Visit: Payer: Self-pay

## 2020-05-13 DIAGNOSIS — R7989 Other specified abnormal findings of blood chemistry: Secondary | ICD-10-CM | POA: Diagnosis not present

## 2020-05-13 DIAGNOSIS — Z20822 Contact with and (suspected) exposure to covid-19: Secondary | ICD-10-CM

## 2020-05-13 NOTE — Telephone Encounter (Signed)
Please advise 

## 2020-05-14 LAB — HEPATIC FUNCTION PANEL
ALT: 63 U/L — ABNORMAL HIGH (ref 0–53)
AST: 33 U/L (ref 0–37)
Albumin: 4.8 g/dL (ref 3.5–5.2)
Alkaline Phosphatase: 75 U/L (ref 39–117)
Bilirubin, Direct: 0.1 mg/dL (ref 0.0–0.3)
Total Bilirubin: 0.5 mg/dL (ref 0.2–1.2)
Total Protein: 7.5 g/dL (ref 6.0–8.3)

## 2020-05-15 ENCOUNTER — Ambulatory Visit: Payer: BC Managed Care – PPO | Admitting: Pharmacist

## 2020-05-15 ENCOUNTER — Encounter: Payer: Self-pay | Admitting: Pharmacist

## 2020-05-15 DIAGNOSIS — I10 Essential (primary) hypertension: Secondary | ICD-10-CM

## 2020-05-15 DIAGNOSIS — E1165 Type 2 diabetes mellitus with hyperglycemia: Secondary | ICD-10-CM

## 2020-05-15 DIAGNOSIS — E7800 Pure hypercholesterolemia, unspecified: Secondary | ICD-10-CM

## 2020-05-15 LAB — SARS-COV-2, NAA 2 DAY TAT

## 2020-05-15 LAB — NOVEL CORONAVIRUS, NAA: SARS-CoV-2, NAA: NOT DETECTED

## 2020-05-15 NOTE — Chronic Care Management (AMB) (Signed)
Chronic Care Management   Note  05/15/2020 Name: Phillip Shepard MRN: [id number] DOB: [birth year]   Subjective:  Phillip Shepard is a 54 y.o. year old male who is a primary care patient of Einar Pheasant, MD. The CCM team was consulted for assistance with chronic disease management and care coordination needs.     Contacted patient for initial medication access and medication management support  Mr. Phillip Shepard was given information about Chronic Care Management services today including:  1. CCM service includes personalized support from designated clinical staff supervised by his physician, including individualized plan of care and coordination with other care providers 2. 24/7 contact phone numbers for assistance for urgent and routine care needs. 3. Only one practitioner may furnish and bill the service in a calendar month.   Patient agreed to services and verbal consent obtained.   Review of patient status, including review of consultants reports, laboratory and other test data, was performed as part of comprehensive evaluation and provision of chronic care management services.   SDOH (Social Determinants of Health) assessments and interventions performed:  SDOH Interventions     Most Recent Value  SDOH Interventions  Financial Strain Interventions Intervention Not Indicated       Objective:  Lab Results  Component Value Date   CREATININE 1.03 03/18/2020   CREATININE 0.92 03/28/2019   CREATININE 0.92 12/14/2018    Lab Results  Component Value Date   HGBA1C 9.3 (H) 03/26/2020       Component Value Date/Time   CHOL 215 (H) 03/26/2020 0834   TRIG 361.0 (H) 03/26/2020 0834   HDL 68.60 03/26/2020 0834   CHOLHDL 3 03/26/2020 0834   VLDL 72.2 (H) 03/26/2020 0834   LDLCALC 129 (H) 02/03/2014 1103   LDLDIRECT 90.0 03/26/2020 0834    Clinical ASCVD: No  The 10-year ASCVD risk score [name], et al., 2013) is: 11.5%   Values used to calculate the score:      Age: 16 years     Sex: Male     Is Non-Hispanic African American: No     Diabetic: Yes     Tobacco smoker: No     Systolic Blood Pressure: 371 mmHg     Is BP treated: Yes     HDL Cholesterol: 68.6 mg/dL     Total Cholesterol: 215 mg/dL    BP Readings from Last 3 Encounters:  04/14/20 138/70  03/18/20 122/76  08/27/18 126/70    No Known Allergies  Medications Reviewed Today    Reviewed by De Hollingshead, RPH-CPP (Pharmacist) on 05/15/20 at 1039  Med List Status: <None>  Medication Order Taking? Sig Documenting Provider Last Dose Status Informant       Patient not taking:      Discontinued 05/15/20 1039 (Patient Preference)   gabapentin (NEURONTIN) 300 MG capsule [id number] Yes TAKE 1 CAPSULE BY MOUTH THREE TIMES A Adela Ports, DPM Taking Active   JARDIANCE 25 MG TABS tablet [id number] Yes TAKE 1 TABLET BY MOUTH EVERY DAY Einar Pheasant, MD Taking Active   lisinopril (ZESTRIL) 20 MG tablet [id number] Yes TAKE 1 TABLET BY MOUTH EVERY DAY Einar Pheasant, MD Taking Active   omeprazole (PRILOSEC) 20 MG capsule [id number] Yes Take 20 mg by mouth daily. [provider] Taking Active   OZEMPIC, 1 MG/DOSE, 2 MG/1.5ML SOPN [id number] Yes INJECT 1 MG INTO THE SKIN ONCE A WEEK. Einar Pheasant, MD Taking Active   rosuvastatin (CRESTOR) 40 MG tablet [id number] Yes Take 1  tablet (40 mg total) by mouth daily. 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   Diabetes: uncontrolled; complicated by chronic medical conditions including , most recent A1c 9.3%  Most  recent eGFR: ~74 mL/min  Current antihyperglycemic regimen: Ozempic 1 mg weekly, Jardiance 25 mg daily  o Hx of metformin XR and IR: reports muscle aches, declines to retrial  o Reports he has had diabetes for ~10 years   Current meal patterns: o Breakfast: generally skips o Lunch: packs a sandwich (honey wheat bread ham) w/ a few fries; if not, then fast food, sometimes sides o Supper: chicken, steak;  o Snacks: Almond joys, Matthias Hughs (candy); occasional honey bun o Drinks: has cut back on mountain dew; drinking more water w/ no sugar flavoring; sometime half and half sweet tea every once in while  Current exercise:  o Nothing specific. Notes that he hunts for activity during hunting season  Current blood glucose readings:  o After work (~3-4 hours): 140-250s  Cardiovascular risk reduction: o Current hypertensive regimen: lisinopril 20 mg daily o Current hyperlipidemia regimen: rosuvastatin 40 mg daily (recently increased d/t LDL not at goal <70) o Current antiplatelet regimen: none  Peripheral neuropathy: gabapentin 300 mg TID  GERD: pantoprazole 20 mg daily    Pharmacist Clinical Goal(s):   Over the next 90 days, patient will work with PharmD and primary care provider to address optimized medication management  Interventions:  Comprehensive medication review performed, medication list updated in electronic medical record  Inter-disciplinary care team collaboration (see longitudinal plan of care)  Reviewed goal A1c, goal fasting, and goal 2 hour post prandial glucose readings. Extensive review of micro and macrovascular complications of DM.   Discussed current lifestyle patterns. Not following low carbohydrate diet, snacking and sweets. Believe that more frequent glucose checks to understand impact of dietary choices may provide benefit and empowerment for patient. Patient unsure if he would like CGM, but amenable to try sample. Scheduled for nurse visit for [facility] sample +  CGM teaching. Will pursue insurance coverage moving forward.   Next step likely basal insulin vs sulfonlyurea, based on CGM readings of throughout-the-day elevations vs post-prandial specifically elevations.   Patient Self Care Activities:   Patient will check blood glucose using CGM, document, and provide at future appointments  Patient will take medications as prescribed  Patient will report any questions or concerns to provider   Initial goal documentation        Plan: - Scheduled f/u face to face visit concurring w/ next PCP visit  Catie Darnelle Maffucci, PharmD, [place], [place] Pharmacist [place] [phone]

## 2020-05-15 NOTE — Patient Instructions (Signed)
Visit Information  Goals Addressed              This Visit's Progress     Patient Stated   .  PharmD "I need to get things better controlled" (pt-stated)        CARE PLAN ENTRY (see longitudinal plan of care for additional care plan information)  Current Barriers:  . 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  . Diabetes: uncontrolled; complicated by chronic medical conditions including , most recent A1c 9.3% . Most recent eGFR: ~74 mL/min . Current antihyperglycemic regimen: Ozempic 1 mg weekly, Jardiance 25 mg daily  o Hx of metformin XR and IR: reports muscle aches, declines to retrial  o Reports he has had diabetes for ~10 years  . Current meal patterns: o Breakfast: generally skips o Lunch: packs a sandwich (honey wheat bread ham) w/ a few fries; if not, then fast food, sometimes sides o Supper: chicken, steak;  o Snacks: Almond joys, Matthias Hughs (candy); occasional honey bun o Drinks: has cut back on mountain dew; drinking more water w/ no sugar flavoring; sometime half and half sweet tea every once in while . Current exercise:  o Nothing specific. Notes that he hunts for activity during hunting season . Current blood glucose readings:  o After work (~3-4 hours): 140-250s . Cardiovascular risk reduction: o Current hypertensive regimen: lisinopril 20 mg daily o Current hyperlipidemia regimen: rosuvastatin 40 mg daily (recently increased d/t LDL not at goal <70) o Current antiplatelet regimen: none . Peripheral neuropathy: gabapentin 300 mg TID . GERD: pantoprazole 20 mg daily    Pharmacist Clinical Goal(s):  Marland Kitchen Over the next 90 days, patient will work with PharmD and primary care provider to address optimized medication management  Interventions: . Comprehensive medication review performed, medication list updated in electronic medical record . Inter-disciplinary care  team collaboration (see longitudinal plan of care) . Reviewed goal A1c, goal fasting, and goal 2 hour post prandial glucose readings. Extensive review of micro and macrovascular complications of DM.  Marland Kitchen Discussed current lifestyle patterns. Not following low carbohydrate diet, snacking and sweets. Believe that more frequent glucose checks to understand impact of dietary choices may provide benefit and empowerment for patient. Patient unsure if he would like CGM, but amenable to try sample. Scheduled for nurse visit for [facility] sample + CGM teaching. Will pursue insurance coverage moving forward.  . Next step likely basal insulin vs sulfonlyurea, based on CGM readings of throughout-the-day elevations vs post-prandial specifically elevations.   Patient Self Care Activities:  . Patient will check blood glucose using CGM, document, and provide at future appointments . Patient will take medications as prescribed . Patient will report any questions or concerns to provider   Initial goal documentation        Mr. Swayze was given information about Chronic Care Management services today including:  1. CCM service includes personalized support from designated clinical staff supervised by his physician, including individualized plan of care and coordination with other care providers 2. Service will only be billed when office clinical staff spend 20 minutes or more in a month to coordinate care. 3. The patient will be responsible for cost sharing (co-pay) of up to 20% of the service fee (after annual deductible is met).  Patient agreed to services and verbal consent obtained.   The patient verbalized understanding of instructions provided today and declined  a print copy of patient instruction materials.   Plan: - Scheduled f/u face to face visit concurring w/ next PCP visit  Catie Darnelle Maffucci, PharmD, [place], [place] Pharmacist [place] [phone]

## 2020-05-19 ENCOUNTER — Telehealth: Payer: BC Managed Care – PPO

## 2020-05-20 ENCOUNTER — Telehealth: Payer: Self-pay

## 2020-05-20 NOTE — Telephone Encounter (Signed)
LMTCB for results. 

## 2020-05-28 ENCOUNTER — Ambulatory Visit: Payer: BC Managed Care – PPO

## 2020-06-03 ENCOUNTER — Other Ambulatory Visit: Payer: BC Managed Care – PPO

## 2020-06-03 ENCOUNTER — Ambulatory Visit: Payer: BC Managed Care – PPO

## 2020-06-03 DIAGNOSIS — Z20822 Contact with and (suspected) exposure to covid-19: Secondary | ICD-10-CM

## 2020-06-04 ENCOUNTER — Other Ambulatory Visit: Payer: Self-pay

## 2020-06-04 LAB — SARS-COV-2, NAA 2 DAY TAT

## 2020-06-04 LAB — NOVEL CORONAVIRUS, NAA: SARS-CoV-2, NAA: NOT DETECTED

## 2020-06-09 ENCOUNTER — Ambulatory Visit: Payer: BC Managed Care – PPO | Admitting: Pharmacist

## 2020-06-09 ENCOUNTER — Other Ambulatory Visit: Payer: Self-pay

## 2020-06-09 ENCOUNTER — Ambulatory Visit (INDEPENDENT_AMBULATORY_CARE_PROVIDER_SITE_OTHER): Payer: BC Managed Care – PPO | Admitting: Internal Medicine

## 2020-06-09 DIAGNOSIS — E7800 Pure hypercholesterolemia, unspecified: Secondary | ICD-10-CM | POA: Diagnosis not present

## 2020-06-09 DIAGNOSIS — K219 Gastro-esophageal reflux disease without esophagitis: Secondary | ICD-10-CM | POA: Diagnosis not present

## 2020-06-09 DIAGNOSIS — G629 Polyneuropathy, unspecified: Secondary | ICD-10-CM

## 2020-06-09 DIAGNOSIS — F1010 Alcohol abuse, uncomplicated: Secondary | ICD-10-CM

## 2020-06-09 DIAGNOSIS — E1159 Type 2 diabetes mellitus with other circulatory complications: Secondary | ICD-10-CM

## 2020-06-09 DIAGNOSIS — R7989 Other specified abnormal findings of blood chemistry: Secondary | ICD-10-CM

## 2020-06-09 DIAGNOSIS — E1165 Type 2 diabetes mellitus with hyperglycemia: Secondary | ICD-10-CM

## 2020-06-09 DIAGNOSIS — M79644 Pain in right finger(s): Secondary | ICD-10-CM

## 2020-06-09 DIAGNOSIS — I152 Hypertension secondary to endocrine disorders: Secondary | ICD-10-CM

## 2020-06-09 DIAGNOSIS — I10 Essential (primary) hypertension: Secondary | ICD-10-CM

## 2020-06-09 NOTE — Chronic Care Management (AMB) (Signed)
Chronic Care Management   Follow Up Note   06/09/2020 Name: Phillip Shepard MRN: [id number] DOB: 1966-03-05  Referred by: Einar Pheasant, MD Reason for referral : Chronic Care Management (Medication Management)   Phillip Shepard is a 54 y.o. year old male who is a primary care patient of Einar Pheasant, MD. The CCM team was consulted for assistance with chronic disease management and care coordination needs.    Met with patient face to face prior to PCP visit.   Review of patient status, including review of consultants reports, relevant laboratory and other test results, and collaboration with appropriate care team members and the patient's provider was performed as part of comprehensive patient evaluation and provision of chronic care management services.    SDOH (Social Determinants of Health) assessments performed: No See Care Plan activities for detailed interventions related to [name])     Outpatient Encounter Medications as of 06/09/2020  Medication Sig  . gabapentin (NEURONTIN) 300 MG capsule TAKE 1 CAPSULE BY MOUTH THREE TIMES A DAY  . JARDIANCE 25 MG TABS tablet TAKE 1 TABLET BY MOUTH EVERY DAY  . lisinopril (ZESTRIL) 20 MG tablet TAKE 1 TABLET BY MOUTH EVERY DAY  . omeprazole (PRILOSEC) 20 MG capsule Take 20 mg by mouth daily.  Marland Kitchen OZEMPIC, 1 MG/DOSE, 2 MG/1.5ML SOPN INJECT 1 MG INTO THE SKIN ONCE A WEEK.  . rosuvastatin (CRESTOR) 40 MG tablet Take 1 tablet (40 mg total) by mouth daily.   No facility-administered encounter medications on file as of 06/09/2020.     Objective:   Goals Addressed              This Visit's Progress     Patient Stated   .  PharmD "I need to get things better controlled" (pt-stated)        CARE PLAN ENTRY (see longitudinal plan of care for additional care plan information)  Current Barriers:  . Social, financial, community barriers:  o None at this time. Notes he was unable to make the CGM placement appointment d/t other work  commitments. Reviewed today, and he is amenable to giving CGM a try . Diabetes: uncontrolled; complicated by chronic medical conditions including , most recent A1c 9.3% . Most recent eGFR: ~74 mL/min . Current antihyperglycemic regimen: Ozempic 1 mg weekly, Jardiance 25 mg daily  o Hx of metformin XR and IR: reports muscle aches, declines to retrial  o Reports he has had diabetes for ~10 years . Cardiovascular risk reduction: o Current hypertensive regimen: lisinopril 20 mg daily o Current hyperlipidemia regimen: rosuvastatin 40 mg daily (recently increased d/t LDL not at goal <70); LFT elevations though likely related to alcohol use o Current antiplatelet regimen: none . Peripheral neuropathy: gabapentin 300 mg TID . GERD: pantoprazole 20 mg daily    Pharmacist Clinical Goal(s):  Marland Kitchen Over the next 90 days, patient will work with PharmD and primary care provider to address optimized medication management  Interventions: . Comprehensive medication review performed, medication list updated in electronic medical record . Inter-disciplinary care team collaboration (see longitudinal plan of care) . Educated on CGM, walked through placement and use. Will allow patient to use for 2 weeks to see if he derives benefit. Discussed importance of utilizing post prandial scan data to determine how to modify diet. If patient unable to determine or unable to commit to dietary modifications to improve glucose, would recommend basal insulin vs sulfonylurea, pending if elevations are more related to fasting or post prandial.   Patient  Self Care Activities:  . Patient will check blood glucose using CGM, document, and provide at future appointments . Patient will take medications as prescribed . Patient will report any questions or concerns to provider   Please see past updates related to this goal by clicking on the "Past Updates" button in the selected goal          Plan:  - Scheduled f/u call in ~ 2  weeks  Catie Darnelle Maffucci, PharmD, [place], [place] Pharmacist [place] [phone]

## 2020-06-09 NOTE — Progress Notes (Signed)
[name]: Phillip Shepard, male   DOB: 02/07/1966, 54 y.o.   MRN: [id number]   Subjective:    [name]: Phillip Shepard, male    DOB: 1966/08/06, 54 y.o.   MRN: [id number]  HPI This visit occurred during the SARS-CoV-2 public health emergency.  Safety protocols were in place, including screening questions prior to the visit, additional usage of staff PPE, and extensive cleaning of exam room while observing appropriate contact time as indicated for disinfecting solutions.  Patient here for a scheduled follow up  Here to follow up regarding his sugars, blood pressure and cholesterol.  Not watching his diet.  Discussed diet and exercise.  No chest pain or sob.  No acid reflux or abdominal pain reported.  Bowels stable.  Saw ortho for trigger finger.  Still with issues.  Discussed f/u with ortho.  Taking jardiance and ozempic.  Sugar control - elevated. Met with Catie.  Agreeable to CGM.    Past Medical History:  Diagnosis Date  . Allergy   . Diabetes mellitus without complication ([place])   . Dysphagia    s/p esophageal dilatation  . GERD (gastroesophageal reflux disease)   . History of chickenpox   . History of kidney stones   . Hypercholesterolemia   . Hypertension   . Kidney stones 07/18/2013   Past Surgical History:  Procedure Laterality Date  . ESOPHAGEAL DILATION    . TONSILLECTOMY  1970   Family History  Problem Relation Age of Onset  . Hyperlipidemia Mother   . Hypertension Father   . Arthritis Maternal Grandmother   . Diabetes Maternal Grandmother   . Lung cancer Maternal Grandfather   . Heart disease Paternal Grandfather   . Stroke Paternal Grandfather    Social History   Socioeconomic History  . Marital status: Married    Spouse name: Diane  . Number of children: 2  . Years of education: Not on file  . Highest education level: Not on file  Occupational History    Employer: [employer]  Tobacco Use  . Smoking status: Never Smoker  . Smokeless tobacco:  Former Systems developer    Types: Chew  . Tobacco comment: [place]  Substance and Sexual Activity  . Alcohol use: Yes    Alcohol/week: 0.0 standard drinks    Comment: drinks 3-4 beers per day  . Drug use: No  . Sexual activity: Yes  Other Topics Concern  . Not on file  Social History Narrative  . Not on file   Social Determinants of Health   Financial Resource Strain: Low Risk   . Difficulty of Paying Living Expenses: Not hard at all  Food Insecurity:   . Worried About Charity fundraiser in the Last Year: Not on file  . Ran Out of Food in the Last Year: Not on file  Transportation Needs:   . Lack of Transportation (Medical): Not on file  . Lack of Transportation (Non-Medical): Not on file  Physical Activity:   . Days of Exercise per Week: Not on file  . Minutes of Exercise per Session: Not on file  Stress:   . Feeling of Stress : Not on file  Social Connections:   . Frequency of Communication with Friends and Family: Not on file  . Frequency of Social Gatherings with Friends and Family: Not on file  . Attends Religious Services: Not on file  . Active Member of Clubs or Organizations: Not on file  . Attends Archivist Meetings: Not on  file  . Marital Status: Not on file    Outpatient Encounter Medications as of 06/09/2020  Medication Sig  . gabapentin (NEURONTIN) 300 MG capsule TAKE 1 CAPSULE BY MOUTH THREE TIMES A DAY  . JARDIANCE 25 MG TABS tablet TAKE 1 TABLET BY MOUTH EVERY DAY  . lisinopril (ZESTRIL) 20 MG tablet TAKE 1 TABLET BY MOUTH EVERY DAY  . omeprazole (PRILOSEC) 20 MG capsule Take 20 mg by mouth daily.  Marland Kitchen OZEMPIC, 1 MG/DOSE, 2 MG/1.5ML SOPN INJECT 1 MG INTO THE SKIN ONCE A WEEK.  . rosuvastatin (CRESTOR) 40 MG tablet Take 1 tablet (40 mg total) by mouth daily.   No facility-administered encounter medications on file as of 06/09/2020.    Review of Systems  Constitutional: Negative for appetite change and unexpected weight change.  HENT: Negative for  congestion and sinus pressure.   Respiratory: Negative for cough, chest tightness and shortness of breath.   Cardiovascular: Negative for chest pain, palpitations and leg swelling.  Gastrointestinal: Negative for abdominal pain, diarrhea, nausea and vomiting.  Genitourinary: Negative for difficulty urinating and dysuria.  Musculoskeletal: Negative for joint swelling and myalgias.  Skin: Negative for color change and rash.  Neurological: Negative for dizziness and headaches.  Psychiatric/Behavioral: Negative for agitation and dysphoric mood.       Objective:    Physical Exam Vitals reviewed.  Constitutional:      General: He is not in acute distress.    Appearance: Normal appearance.  HENT:     Head: Normocephalic and atraumatic.     Right Ear: External ear normal.     Left Ear: External ear normal.  Eyes:     General: No scleral icterus.       Right eye: No discharge.        Left eye: No discharge.     Conjunctiva/sclera: Conjunctivae normal.  Cardiovascular:     Rate and Rhythm: Normal rate and regular rhythm.     Pulses: Normal pulses.  Pulmonary:     Effort: Pulmonary effort is normal.     Breath sounds: Normal breath sounds. No rhonchi.  Abdominal:     General: Bowel sounds are normal.     Palpations: Abdomen is soft.     Tenderness: There is no abdominal tenderness.  Musculoskeletal:        General: No swelling or tenderness.     Cervical back: Neck supple. No tenderness.  Lymphadenopathy:     Cervical: No cervical adenopathy.  Skin:    Findings: No erythema or rash.  Neurological:     Mental Status: He is alert.  Psychiatric:        Mood and Affect: Mood normal.        Behavior: Behavior normal.     BP 132/76   Pulse 71   Temp (!) 97.4 F (36.3 C) (Oral)   Resp 16   Ht 5' 7"  (1.702 m)   Wt 195 lb (88.5 kg)   SpO2 99%   BMI 30.54 kg/m  Wt Readings from Last 3 Encounters:  06/09/20 195 lb (88.5 kg)  04/14/20 196 lb (88.9 kg)  03/18/20 195 lb 12.8  oz (88.8 kg)     Lab Results  Component Value Date   WBC 7.2 03/18/2020   HGB 13.3 03/18/2020   HCT 38.7 (L) 03/18/2020   PLT 208.0 03/18/2020   GLUCOSE 266 (H) 03/18/2020   CHOL 215 (H) 03/26/2020   TRIG 361.0 (H) 03/26/2020   HDL 68.60 03/26/2020   LDLDIRECT  90.0 03/26/2020   LDLCALC 129 (H) 02/03/2014   ALT 63 (H) 05/13/2020   AST 33 05/13/2020   NA 140 03/18/2020   K 4.7 03/18/2020   CL 102 03/18/2020   CREATININE 1.03 03/18/2020   BUN 17 03/18/2020   CO2 28 03/18/2020   TSH 1.40 03/18/2020   PSA 0.22 03/18/2020   HGBA1C 9.3 (H) 03/26/2020   MICROALBUR 0.8 03/18/2020       Assessment & Plan:   Problem List Items Addressed This Visit    Neuropathy    On gabapentin.  Stable.       Hypertension associated with diabetes ([place])    Blood pressure doing well on lisinopril.  Follow pressures.  Follow metabolic panel.        Hypercholesterolemia    On crestor.  Low cholesterol diet and exercise.  Follow lipid panel and liver function tests.        Relevant Orders   Lipid panel   GERD (gastroesophageal reflux disease)    No upper symptoms reported.  On prilosec.       Finger pain, right    Seeing ortho.  Trigger finger.  Continue f/u with ortho.      Essential hypertension, benign    Blood pressure doing well on lisinopril.  Follow pressures.  Follow metabolic panel.       Relevant Orders   Basic metabolic panel   Elevated liver function tests    Has declined abdominal ultrasound.  Discussed diet, exercise and weight loss.  Follow liver function tests.  Need to get sugars under better control.  Follow.       Relevant Orders   Hepatic function panel   Diabetes ([place])    On ozempic and jardiance.  Sugars poorly controlled.  a1c elevated - 9.4.  Met with Catie.  Agreed to CGM.  Follow sugars.  Follow metabolic panel.        Relevant Orders   Hemoglobin A1c   Alcohol abuse    Discussed increased alcohol intake and need to decrease.             Einar Pheasant, MD

## 2020-06-09 NOTE — Patient Instructions (Signed)
Visit Information  Goals Addressed              This Visit's Progress     Patient Stated     PharmD "I need to get things better controlled" (pt-stated)        CARE PLAN ENTRY (see longitudinal plan of care for additional care plan information)  Current Barriers:   Social, financial, community barriers:  o None at this time. Notes he was unable to make the CGM placement appointment d/t other work commitments. Reviewed today, and he is amenable to giving CGM a try  Diabetes: uncontrolled; complicated by chronic medical conditions including , most recent A1c 9.3%  Most recent eGFR: ~74 mL/min  Current antihyperglycemic regimen: Ozempic 1 mg weekly, Jardiance 25 mg daily  o Hx of metformin XR and IR: reports muscle aches, declines to retrial  o Reports he has had diabetes for ~10 years  Cardiovascular risk reduction: o Current hypertensive regimen: lisinopril 20 mg daily o Current hyperlipidemia regimen: rosuvastatin 40 mg daily (recently increased d/t LDL not at goal <70); LFT elevations though likely related to alcohol use o Current antiplatelet regimen: none  Peripheral neuropathy: gabapentin 300 mg TID  GERD: pantoprazole 20 mg daily    Pharmacist Clinical Goal(s):   Over the next 90 days, patient will work with PharmD and primary care provider to address optimized medication management  Interventions:  Comprehensive medication review performed, medication list updated in electronic medical record  Inter-disciplinary care team collaboration (see longitudinal plan of care)  Educated on CGM, walked through placement and use. Will allow patient to use for 2 weeks to see if he derives benefit. Discussed importance of utilizing post prandial scan data to determine how to modify diet. If patient unable to determine or unable to commit to dietary modifications to improve glucose, would recommend basal insulin vs sulfonylurea, pending if elevations are more related to  fasting or post prandial.   Patient Self Care Activities:   Patient will check blood glucose using CGM, document, and provide at future appointments  Patient will take medications as prescribed  Patient will report any questions or concerns to provider   Please see past updates related to this goal by clicking on the "Past Updates" button in the selected goal         The patient verbalized understanding of instructions provided today and declined a print copy of patient instruction materials.  Plan:  - Scheduled f/u call in ~ 2 weeks  Catie Darnelle Maffucci, PharmD, [place], [place] Pharmacist [place] [phone]

## 2020-06-10 ENCOUNTER — Other Ambulatory Visit: Payer: BC Managed Care – PPO

## 2020-06-10 DIAGNOSIS — Z20822 Contact with and (suspected) exposure to covid-19: Secondary | ICD-10-CM

## 2020-06-11 LAB — NOVEL CORONAVIRUS, NAA: SARS-CoV-2, NAA: NOT DETECTED

## 2020-06-11 LAB — SARS-COV-2, NAA 2 DAY TAT

## 2020-06-14 ENCOUNTER — Encounter: Payer: Self-pay | Admitting: Internal Medicine

## 2020-06-14 NOTE — Assessment & Plan Note (Signed)
Seeing ortho.  Trigger finger.  Continue f/u with ortho.

## 2020-06-14 NOTE — Assessment & Plan Note (Signed)
Blood pressure doing well on lisinopril.  Follow pressures.  Follow metabolic panel.  

## 2020-06-14 NOTE — Assessment & Plan Note (Signed)
No upper symptoms reported. On prilosec.  

## 2020-06-14 NOTE — Assessment & Plan Note (Signed)
Discussed increased alcohol intake and need to decrease.

## 2020-06-14 NOTE — Assessment & Plan Note (Signed)
On crestor.  Low cholesterol diet and exercise.  Follow lipid panel and liver function tests.   

## 2020-06-14 NOTE — Assessment & Plan Note (Signed)
On ozempic and jardiance.  Sugars poorly controlled.  a1c elevated - 9.4.  Met with Catie.  Agreed to CGM.  Follow sugars.  Follow metabolic panel.

## 2020-06-14 NOTE — Assessment & Plan Note (Signed)
Has declined abdominal ultrasound.  Discussed diet, exercise and weight loss.  Follow liver function tests.  Need to get sugars under better control.  Follow.

## 2020-06-14 NOTE — Assessment & Plan Note (Signed)
On gabapentin.  Stable.  

## 2020-06-17 ENCOUNTER — Other Ambulatory Visit: Payer: BC Managed Care – PPO

## 2020-06-17 DIAGNOSIS — Z20822 Contact with and (suspected) exposure to covid-19: Secondary | ICD-10-CM

## 2020-06-18 LAB — SARS-COV-2, NAA 2 DAY TAT

## 2020-06-18 LAB — NOVEL CORONAVIRUS, NAA: SARS-CoV-2, NAA: NOT DETECTED

## 2020-06-24 ENCOUNTER — Ambulatory Visit: Payer: BC Managed Care – PPO | Admitting: Pharmacist

## 2020-06-24 DIAGNOSIS — E1165 Type 2 diabetes mellitus with hyperglycemia: Secondary | ICD-10-CM

## 2020-06-24 MED ORDER — FREESTYLE LIBRE 2 SENSOR MISC: 11 refills | Status: DC

## 2020-06-24 NOTE — Chronic Care Management (AMB) (Signed)
Chronic Care Management   Follow Up Note   06/24/2020 Name: Phillip Shepard MRN: [id number] DOB: 1966/07/27  Referred by: Einar Pheasant, MD Reason for referral : Chronic Care Management (Medication Management)   Phillip Shepard is a 55 y.o. year old male who is a primary care patient of Einar Pheasant, MD. The CCM team was consulted for assistance with chronic disease management and care coordination needs.    Contacted patient for medication management f/u.   Review of patient status, including review of consultants reports, relevant laboratory and other test results, and collaboration with appropriate care team members and the patient's provider was performed as part of comprehensive patient evaluation and provision of chronic care management services.    SDOH (Social Determinants of Health) assessments performed: No See Care Plan activities for detailed interventions related to [facility])     Outpatient Encounter Medications as of 06/24/2020  Medication Sig  . Continuous Blood Gluc Sensor (FREESTYLE LIBRE 2 SENSOR) MISC Use to check glucose at least 4 times daily  . gabapentin (NEURONTIN) 300 MG capsule TAKE 1 CAPSULE BY MOUTH THREE TIMES A DAY  . JARDIANCE 25 MG TABS tablet TAKE 1 TABLET BY MOUTH EVERY DAY  . lisinopril (ZESTRIL) 20 MG tablet TAKE 1 TABLET BY MOUTH EVERY DAY  . omeprazole (PRILOSEC) 20 MG capsule Take 20 mg by mouth daily.  Marland Kitchen OZEMPIC, 1 MG/DOSE, 2 MG/1.5ML SOPN INJECT 1 MG INTO THE SKIN ONCE A WEEK.  . rosuvastatin (CRESTOR) 40 MG tablet Take 1 tablet (40 mg total) by mouth daily.   No facility-administered encounter medications on file as of 06/24/2020.     Objective:   Goals Addressed              This Visit's Progress     Patient Stated   .  PharmD "I need to get things better controlled" (pt-stated)        CARE PLAN ENTRY (see longitudinal plan of care for additional care plan information)  Current Barriers:  . Social, financial, community  barriers: o Reports that he enjoyed using CGM more than he thought he would. Notes that he realized a lot of things that can impact BG that he didn't realize would do so.  . Diabetes: uncontrolled; complicated by chronic medical conditions including, most recent A1c 9.3% . Most recent eGFR: ~74 mL/min . Current antihyperglycemic regimen: Ozempic 1 mg weekly, Jardiance 25 mg daily  o Hx of metformin XR and IR: reports muscle aches, declines to retrial  o Reports he has had diabetes for ~10 years . Current glucose readings:  o Fasting: 200s o Post prandial: 140-200s . Cardiovascular risk reduction: o Current hypertensive regimen: lisinopril 20 mg daily o Current hyperlipidemia regimen: rosuvastatin 40 mg daily (recently increased d/t LDL not at goal <70); LFT elevations though likely related to alcohol use o Current antiplatelet regimen: none . Peripheral neuropathy: gabapentin 300 mg TID . GERD: pantoprazole 20 mg daily    Pharmacist Clinical Goal(s):  Marland Kitchen Over the next 90 days, patient will work with PharmD and primary care provider to address optimized medication management  Interventions: . Comprehensive medication review performed, medication list updated in electronic medical record . Inter-disciplinary care team collaboration (see longitudinal plan of care) . Discussed continued use of CGM. Patient amenable. Script sent, likely insurance will not cover and patient will consider paying cash price of $75/month.  . Reviewed principals of using post prandial glucose readings to evaluate carbohydrate content of meals. He verbalizes understanding.  Discussed starting insulin vs waiting until next A1c + CGM use. He elects to wait until next A1c. Discussed initiation of daily basal insulin if no significant improvement in A1c. He verbalizes understanding.  Patient Self Care Activities:  . Patient will check blood glucose using CGM, document, and provide at future appointments . Patient will  take medications as prescribed . Patient will report any questions or concerns to provider   Please see past updates related to this goal by clicking on the "Past Updates" button in the selected goal           Plan:  - Scheduled f/u call in ~10 weeks (due for PCP f/u and lab work after 11/5)  Courtney Heys, PharmD, [place], [place] Pharmacist [place] [phone]

## 2020-06-24 NOTE — Patient Instructions (Signed)
Visit Information  Goals Addressed              This Visit's Progress     Patient Stated   .  PharmD "I need to get things better controlled" (pt-stated)        CARE PLAN ENTRY (see longitudinal plan of care for additional care plan information)  Current Barriers:  . Social, financial, community barriers: o Reports that he enjoyed using CGM more than he thought he would. Notes that he realized a lot of things that can impact BG that he didn't realize would do so.  . Diabetes: uncontrolled; complicated by chronic medical conditions including, most recent A1c 9.3% . Most recent eGFR: ~74 mL/min . Current antihyperglycemic regimen: Ozempic 1 mg weekly, Jardiance 25 mg daily  o Hx of metformin XR and IR: reports muscle aches, declines to retrial  o Reports he has had diabetes for ~10 years . Current glucose readings:  o Fasting: 200s o Post prandial: 140-200s . Cardiovascular risk reduction: o Current hypertensive regimen: lisinopril 20 mg daily o Current hyperlipidemia regimen: rosuvastatin 40 mg daily (recently increased d/t LDL not at goal <70); LFT elevations though likely related to alcohol use o Current antiplatelet regimen: none . Peripheral neuropathy: gabapentin 300 mg TID . GERD: pantoprazole 20 mg daily    Pharmacist Clinical Goal(s):  Marland Kitchen Over the next 90 days, patient will work with PharmD and primary care provider to address optimized medication management  Interventions: . Comprehensive medication review performed, medication list updated in electronic medical record . Inter-disciplinary care team collaboration (see longitudinal plan of care) . Discussed continued use of CGM. Patient amenable. Script sent, likely insurance will not cover and patient will consider paying cash price of $75/month.  . Reviewed principals of using post prandial glucose readings to evaluate carbohydrate content of meals. He verbalizes understanding. Discussed starting insulin vs waiting  until next A1c + CGM use. He elects to wait until next A1c. Discussed initiation of daily basal insulin if no significant improvement in A1c. He verbalizes understanding.  Patient Self Care Activities:  . Patient will check blood glucose using CGM, document, and provide at future appointments . Patient will take medications as prescribed . Patient will report any questions or concerns to provider   Please see past updates related to this goal by clicking on the "Past Updates" button in the selected goal         The patient verbalized understanding of instructions provided today and declined a print copy of patient instruction materials.   Plan:  - Scheduled f/u call in ~10 weeks (due for PCP f/u and lab work after 11/5)  Courtney Heys, PharmD, [place], [place] [phone]

## 2020-06-24 NOTE — Progress Notes (Signed)
Lm for pt to call back and schedule.

## 2020-07-06 NOTE — Progress Notes (Signed)
Patient came in today to pick up custom made foot orthotics.  The goals were accomplished and the patient reported no dissatisfaction with said orthotics.  Patient was advised of breakin period and how to report any issues. 

## 2020-07-08 ENCOUNTER — Other Ambulatory Visit: Payer: Self-pay | Admitting: Internal Medicine

## 2020-07-08 LAB — HM DIABETES EYE EXAM

## 2020-07-13 ENCOUNTER — Other Ambulatory Visit: Payer: BC Managed Care – PPO

## 2020-07-13 DIAGNOSIS — Z20822 Contact with and (suspected) exposure to covid-19: Secondary | ICD-10-CM

## 2020-07-14 LAB — SARS-COV-2, NAA 2 DAY TAT

## 2020-07-14 LAB — NOVEL CORONAVIRUS, NAA: SARS-CoV-2, NAA: NOT DETECTED

## 2020-07-20 ENCOUNTER — Other Ambulatory Visit: Payer: BC Managed Care – PPO

## 2020-07-20 DIAGNOSIS — Z20822 Contact with and (suspected) exposure to covid-19: Secondary | ICD-10-CM

## 2020-07-21 LAB — NOVEL CORONAVIRUS, NAA: SARS-CoV-2, NAA: NOT DETECTED

## 2020-07-21 LAB — SARS-COV-2, NAA 2 DAY TAT

## 2020-07-24 ENCOUNTER — Other Ambulatory Visit: Payer: Self-pay | Admitting: Internal Medicine

## 2020-07-27 ENCOUNTER — Other Ambulatory Visit: Payer: BC Managed Care – PPO

## 2020-07-27 DIAGNOSIS — Z20822 Contact with and (suspected) exposure to covid-19: Secondary | ICD-10-CM

## 2020-07-29 LAB — NOVEL CORONAVIRUS, NAA: SARS-CoV-2, NAA: NOT DETECTED

## 2020-07-29 LAB — SARS-COV-2, NAA 2 DAY TAT

## 2020-08-03 ENCOUNTER — Other Ambulatory Visit: Payer: BC Managed Care – PPO

## 2020-08-03 ENCOUNTER — Other Ambulatory Visit: Payer: Self-pay

## 2020-08-03 DIAGNOSIS — Z20822 Contact with and (suspected) exposure to covid-19: Secondary | ICD-10-CM

## 2020-08-04 LAB — NOVEL CORONAVIRUS, NAA: SARS-CoV-2, NAA: NOT DETECTED

## 2020-08-04 LAB — SARS-COV-2, NAA 2 DAY TAT

## 2020-08-09 ENCOUNTER — Other Ambulatory Visit: Payer: Self-pay | Admitting: Internal Medicine

## 2020-08-25 ENCOUNTER — Ambulatory Visit: Payer: BC Managed Care – PPO | Admitting: Pharmacist

## 2020-08-25 DIAGNOSIS — E1165 Type 2 diabetes mellitus with hyperglycemia: Secondary | ICD-10-CM

## 2020-08-25 DIAGNOSIS — E7800 Pure hypercholesterolemia, unspecified: Secondary | ICD-10-CM

## 2020-08-25 DIAGNOSIS — I10 Essential (primary) hypertension: Secondary | ICD-10-CM

## 2020-08-25 MED ORDER — METFORMIN HCL ER 500 MG PO TB24: 1000.0000 mg | ORAL_TABLET | Freq: Two times a day (BID) | ORAL | 1 refills | Status: DC

## 2020-08-25 MED ORDER — OZEMPIC (1 MG/DOSE) 4 MG/3ML ~~LOC~~ SOPN: 1.0000 mg | PEN_INJECTOR | SUBCUTANEOUS | 3 refills | Status: DC

## 2020-08-25 NOTE — Patient Instructions (Signed)
Visit Information  Patient Care Plan: Medication Management    Problem Identified: Diabetes, HTN     Long-Range Goal: Disease Progression Prevention   This Visit's Progress: On track  Priority: High  Note:   Current Barriers:  . Unable to achieve control of diabetes   Pharmacist Clinical Goal(s):  Marland Kitchen Over the next 90 days, patient will achieve control of diabetes as evidenced by A1c  through collaboration with PharmD and provider.   Interventions: . 1:1 collaboration with [name] [place], MD regarding development and update of comprehensive plan of care as evidenced by provider attestation and co-signature . Inter-disciplinary care team collaboration (see longitudinal plan of care) . Comprehensive medication review performed; medication list updated in electronic medical record  Diabetes: . Uncontrolled; current treatment: Ozempic 1 mg weekly, Jardiance 25 mg daily  . Current glucose readings: using CGM, but was unable to stop and read off readings to me during our call. Reports readings remaining in the ~180-250s . Notes that he still has metformin at home, and noted that many of his friends are on metformin and have better glycemic control than him. Asks about restarting . Encouraged to check the expiration date on metformin at home, and to call us if he needs a refill sent in. Discussed that re-trial of metformin XR would be appropriate. Start with metformin XR 500 mg BID with food for at least 1 week, then can increase to 1000 mg BID as tolerated. Discussed that GI upset can be common in the first week, so to give at least a week to determine if he will tolerate . Continue Ozempic 1 mg weekly, Jardiance 25 mg daily. Due for lab work and PCP f/u, will collaborate w/ office to schedule . Requested refill on Ozempic. Sent today.   Hypertension: . Controlled; current treatment: lisinopril 20 mg daily  . Recommended to continue current regimen at this  time  Hyperlipidemia: . Uncontrolled on last lab work though likely improved since subsequent dose increase; current treatment: rosuvastatin 40 mg daily;  . Recommended to continue current regimen at this time. Due for updated lipid panel  Peripheral Neuropathy: . Controlled, currnet regimen gabapentin 300 mg TID . Continue current regimen at this time  GERD: . Controlled per patient report. Current regimen: pantoprazole 20 mg daily . Could consider trial/taper to H2RA in the future. Continue current regimen at this time  Patient Goals/Self-Care Activities . Over the next 90 days, patient will:  - take medications as prescribed check glucose TID using CGM, document, and provide at future appointments  Follow Up Plan: Telephone follow up appointment with care management team member scheduled for: ~ 4 weeks     The patient verbalized understanding of instructions, educational materials, and care plan provided today and declined offer to receive copy of patient instructions, educational materials, and care plan.    Plan: Telephone follow up appointment with care management team member scheduled for:~ 4 weeks  [name], PharmD, [place], CPP Clinical Pharmacist [facility] [phone]

## 2020-08-25 NOTE — Chronic Care Management (AMB) (Signed)
**Note Phillip-Identified via Obfuscation** Care Management   Pharmacy Note  08/25/2020 Name: Phillip Shepard MRN: [id number] DOB: 1966-05-10  Phillip Shepard is a 55 y.o. year old male who is a primary care patient of Phillip Pheasant, MD. The Care Management/Care Coordination team team was consulted for assistance with care management and care coordination needs.    Engaged with patient by telephone for follow up visit in response to provider referral for pharmacy case management and/or care coordination services.   Consent to Services:  Patient was given information about care management/care coordination services, agreed to services, and gave verbal consent prior to initiation of services on 05/15/20. Please see initial visit note for detailed documentation.   Review of patient status, including review of consultants reports, laboratory and other test data, was performed as part of comprehensive evaluation and provision of chronic care management services.   SDOH (Social Determinants of Health) assessments and interventions performed:  SDOH Interventions   Flowsheet Row Most Recent Value  SDOH Interventions   Financial Strain Interventions Intervention Not Indicated       Objective:  Lab Results  Component Value Date   CREATININE 1.03 03/18/2020   CREATININE 0.92 03/28/2019   CREATININE 0.92 12/14/2018    Lab Results  Component Value Date   HGBA1C 9.3 (H) 03/26/2020       Component Value Date/Time   CHOL 215 (H) 03/26/2020 0834   TRIG 361.0 (H) 03/26/2020 0834   HDL 68.60 03/26/2020 0834   CHOLHDL 3 03/26/2020 0834   VLDL 72.2 (H) 03/26/2020 0834   LDLCALC 129 (H) 02/03/2014 1103   LDLDIRECT 90.0 03/26/2020 0834    Lab Results  Component Value Date   VITAMINB12 389 03/18/2020    Clinical ASCVD: No  The 10-year ASCVD risk score Phillip Shepard DC Jr., et al., 2013) is: 10.6%   Values used to calculate the score:     Age: 50 years     Sex: Male     Is Non-Hispanic African American: No     Diabetic: Yes      Tobacco smoker: No     Systolic Blood Pressure: 242 mmHg     Is BP treated: Yes     HDL Cholesterol: 68.6 mg/dL     Total Cholesterol: 215 mg/dL      BP Readings from Last 3 Encounters:  06/09/20 132/76  04/14/20 138/70  03/18/20 122/76    Care Plan  No Known Allergies  Medications Reviewed Today    Reviewed by Phillip Shepard, RPH-CPP (Pharmacist) on 08/25/20 at 1056  Med List Status: <None>  Medication Order Taking? Sig Documenting Provider Last Dose Status Informant  Continuous Blood Gluc Sensor (FREESTYLE LIBRE 2 SENSOR) MISC [id number]  Use to check glucose at least 4 times daily Phillip Pheasant, MD  Active   gabapentin (NEURONTIN) 300 MG capsule [id number] No TAKE 1 CAPSULE BY MOUTH THREE TIMES A Phillip Shepard, DPM Taking Active   JARDIANCE 25 MG TABS tablet [id number]  TAKE 1 TABLET BY MOUTH EVERY DAY Phillip Pheasant, MD  Active   lisinopril (ZESTRIL) 20 MG tablet [id number]  TAKE 1 TABLET BY MOUTH EVERY DAY Phillip Pheasant, MD  Active   omeprazole (PRILOSEC) 20 MG capsule [id number] No Take 20 mg by mouth daily. [provider] Taking Active   rosuvastatin (CRESTOR) 40 MG tablet [id number]  TAKE 1 TABLET BY MOUTH EVERY DAY Phillip Pheasant, MD  Active   Semaglutide, 1 MG/DOSE, (OZEMPIC, 1 MG/DOSE,) 4 MG/3ML SOPN [id number] Yes Inject 1 mg  into the skin once a week. Phillip [place], MD  Active           Patient Active Problem List   Diagnosis Date Noted  . Finger pain, right 04/20/2020  . Tobacco abuse 03/18/2020  . Hypertension associated with diabetes (HCC) 03/18/2020  . Mixed hyperlipidemia 03/18/2020  . Nocturia 03/31/2019  . Fatigue 03/31/2019  . Neuropathy 08/27/2018  . Left shoulder pain 09/05/2017  . Cough 04/30/2015  . Heme positive stool 03/07/2014  . Shoulder pain, right 02/10/2014  . Elevated liver function tests 10/27/2013  . Alcohol abuse 10/27/2013  . GERD (gastroesophageal reflux disease) 07/18/2013  . Kidney stones 07/18/2013  .  Essential hypertension, benign 07/16/2013  . Diabetes (HCC) 07/16/2013  . Hypercholesterolemia 07/16/2013    Conditions to be addressed/monitored per PCP order: HTN, HLD and DM  Patient Care Plan: Medication Management    Problem Identified: Diabetes, HTN     Long-Range Goal: Disease Progression Prevention   This Visit's Progress: On track  Priority: High  Note:   Current Barriers:  . Unable to achieve control of diabetes   Pharmacist Clinical Goal(s):  Marland Kitchen Over the next 90 days, patient will achieve control of diabetes as evidenced by A1c  through collaboration with PharmD and provider.   Interventions: . 1:1 collaboration with Phillip [place], MD regarding development and update of comprehensive plan of care as evidenced by provider attestation and co-signature . Inter-disciplinary care team collaboration (see longitudinal plan of care) . Comprehensive medication review performed; medication list updated in electronic medical record  Diabetes: . Uncontrolled; current treatment: Ozempic 1 mg weekly, Jardiance 25 mg daily  . Current glucose readings: using CGM, but was unable to stop and read off readings to me during our call. Reports readings remaining in the ~180-250s . Notes that he still has metformin at home, and noted that many of his friends are on metformin and have better glycemic control than him. Asks about restarting . Encouraged to check the expiration date on metformin at home, and to call us if he needs a refill sent in. Discussed that re-trial of metformin XR would be appropriate. Start with metformin XR 500 mg BID with food for at least 1 week, then can increase to 1000 mg BID as tolerated. Discussed that GI upset can be common in the first week, so to give at least a week to determine if he will tolerate . Continue Ozempic 1 mg weekly, Jardiance 25 mg daily. Due for lab work and PCP f/u, will collaborate w/ office to schedule . Requested refill on Ozempic. Sent today.    Hypertension: . Controlled; current treatment: lisinopril 20 mg daily  . Recommended to continue current regimen at this time  Hyperlipidemia: . Uncontrolled on last lab work though likely improved since subsequent dose increase; current treatment: rosuvastatin 40 mg daily;  . Recommended to continue current regimen at this time. Due for updated lipid panel  Peripheral Neuropathy: . Controlled, currnet regimen gabapentin 300 mg TID . Continue current regimen at this time  GERD: . Controlled per patient report. Current regimen: pantoprazole 20 mg daily . Could consider trial/taper to H2RA in the future. Continue current regimen at this time  Patient Goals/Self-Care Activities . Over the next 90 days, patient will:  - take medications as prescribed check glucose TID using CGM, document, and provide at future appointments  Follow Up Plan: Telephone follow up appointment with care management team member scheduled for: ~ 4 weeks     Medication Assistance:  None required. Patient affirms current coverage meets needs.   Plan: Telephone follow up appointment with care management team member scheduled for:~ 4 weeks  [name], PharmD, [place], CPP Clinical Pharmacist [facility] [employer] [phone]

## 2020-08-31 ENCOUNTER — Other Ambulatory Visit: Payer: Self-pay

## 2020-08-31 DIAGNOSIS — Z20822 Contact with and (suspected) exposure to covid-19: Secondary | ICD-10-CM

## 2020-09-01 LAB — NOVEL CORONAVIRUS, NAA: SARS-CoV-2, NAA: NOT DETECTED

## 2020-09-01 LAB — SARS-COV-2, NAA 2 DAY TAT

## 2020-09-14 ENCOUNTER — Telehealth: Payer: Self-pay | Admitting: Internal Medicine

## 2020-09-14 ENCOUNTER — Other Ambulatory Visit: Payer: Self-pay

## 2020-09-14 DIAGNOSIS — Z20822 Contact with and (suspected) exposure to covid-19: Secondary | ICD-10-CM

## 2020-09-14 DIAGNOSIS — E1165 Type 2 diabetes mellitus with hyperglycemia: Secondary | ICD-10-CM

## 2020-09-14 MED ORDER — METFORMIN HCL ER 500 MG PO TB24: 1000.0000 mg | ORAL_TABLET | Freq: Two times a day (BID) | ORAL | 1 refills | Status: DC

## 2020-09-14 NOTE — Telephone Encounter (Signed)
Patient called for refill for metFORMIN (GLUCOPHAGE XR) 500 MG 24 hr tablet

## 2020-09-15 LAB — NOVEL CORONAVIRUS, NAA: SARS-CoV-2, NAA: NOT DETECTED

## 2020-09-15 LAB — SARS-COV-2, NAA 2 DAY TAT

## 2020-09-19 ENCOUNTER — Encounter: Payer: Self-pay | Admitting: *Deleted

## 2020-09-21 ENCOUNTER — Telehealth: Payer: Self-pay | Admitting: Internal Medicine

## 2020-09-21 ENCOUNTER — Other Ambulatory Visit: Payer: Self-pay

## 2020-09-21 DIAGNOSIS — E1165 Type 2 diabetes mellitus with hyperglycemia: Secondary | ICD-10-CM

## 2020-09-21 DIAGNOSIS — Z20822 Contact with and (suspected) exposure to covid-19: Secondary | ICD-10-CM

## 2020-09-21 MED ORDER — METFORMIN HCL ER 500 MG PO TB24: 1000.0000 mg | ORAL_TABLET | Freq: Two times a day (BID) | ORAL | 1 refills | Status: DC

## 2020-09-21 NOTE — Telephone Encounter (Signed)
Patient would like a prescription for MetForm.  His has expired.

## 2020-09-23 ENCOUNTER — Ambulatory Visit: Payer: Self-pay | Admitting: Pharmacist

## 2020-09-23 DIAGNOSIS — E1165 Type 2 diabetes mellitus with hyperglycemia: Secondary | ICD-10-CM

## 2020-09-23 DIAGNOSIS — I10 Essential (primary) hypertension: Secondary | ICD-10-CM

## 2020-09-23 DIAGNOSIS — E7800 Pure hypercholesterolemia, unspecified: Secondary | ICD-10-CM

## 2020-09-23 LAB — NOVEL CORONAVIRUS, NAA: SARS-CoV-2, NAA: NOT DETECTED

## 2020-09-23 LAB — SARS-COV-2, NAA 2 DAY TAT

## 2020-09-23 NOTE — Patient Instructions (Signed)
Visit Information  Goals Addressed              This Visit's Progress     Patient Stated   .  Medication Monitoring (pt-stated)        Patient Goals/Self-Care Activities . Over the next 90 days, patient will:  - take medications as prescribed check glucose TID using CGM, document, and provide at future          Patient verbalizes understanding of instructions provided today and agrees to view in MyChart.   Plan: Telephone follow up appointment with care management team member scheduled for:  ~ 4 weeks  [name], PharmD, [place], CPP Clinical Pharmacist [facility] [employer] [phone]

## 2020-09-23 NOTE — Chronic Care Management (AMB) (Signed)
Care Management   Pharmacy Note  09/23/2020 Name: Phillip Shepard MRN: [id number] DOB: [birth year]  Subjective: Phillip Shepard is a 55 y.o. year old male who is a primary care patient of [name] [place], MD. The Care Management team was consulted for assistance with care management and care coordination needs.    Engaged with patient by telephone for follow up visit in response to provider referral for pharmacy case management and/or care coordination services.   The patient was given information about Care Management services today including:  1. Care Management services includes personalized support from designated clinical staff supervised by the patient's primary care provider, including individualized plan of care and coordination with other care providers. 2. 24/7 contact phone numbers for assistance for urgent and routine care needs. 3. The patient may stop case management services at any time by phone call to the office staff.  Patient agreed to services and consent obtained.  Assessment:  Review of patient status, including review of consultants reports, laboratory and other test data, was performed as part of comprehensive evaluation and provision of chronic care management services.   SDOH (Social Determinants of Health) assessments and interventions performed:    Objective:  Lab Results  Component Value Date   CREATININE 1.03 03/18/2020   CREATININE 0.92 03/28/2019   CREATININE 0.92 12/14/2018    Lab Results  Component Value Date   HGBA1C 9.3 (H) 03/26/2020       Component Value Date/Time   CHOL 215 (H) 03/26/2020 0834   TRIG 361.0 (H) 03/26/2020 0834   HDL 68.60 03/26/2020 0834   CHOLHDL 3 03/26/2020 0834   VLDL 72.2 (H) 03/26/2020 0834   LDLCALC 129 (H) 02/03/2014 1103   LDLDIRECT 90.0 03/26/2020 0834   BP Readings from Last 3 Encounters:  06/09/20 132/76  04/14/20 138/70  03/18/20 122/76    Care Plan  No Known Allergies  Medications Reviewed  Today    Reviewed by [name], RPH-CPP (Pharmacist) on 09/23/20 at 1021  Med List Status: <None>  Medication Order Taking? Sig Documenting Provider Last Dose Status Informant  Continuous Blood Gluc Sensor (FREESTYLE LIBRE 2 SENSOR) MISC [id number] Yes Use to check glucose at least 4 times daily [name] [place], MD Taking Active   gabapentin (NEURONTIN) 300 MG capsule [id number] Yes TAKE 1 CAPSULE BY MOUTH THREE TIMES A DAY [name], DPM Taking Active   JARDIANCE 25 MG TABS tablet [id number] Yes TAKE 1 TABLET BY MOUTH EVERY DAY [name] [place], MD Taking Active   lisinopril (ZESTRIL) 20 MG tablet [id number] Yes TAKE 1 TABLET BY MOUTH EVERY DAY [name] [place], MD Taking Active   metFORMIN (GLUCOPHAGE XR) 500 MG 24 hr tablet [id number] Yes Take 2 tablets (1,000 mg total) by mouth in the morning and at bedtime. [name] [place], MD Taking Active   omeprazole (PRILOSEC) 20 MG capsule [id number]  Take 20 mg by mouth daily. [provider]  Active   rosuvastatin (CRESTOR) 40 MG tablet [id number] Yes TAKE 1 TABLET BY MOUTH EVERY DAY [name] [place], MD Taking Active   Semaglutide, 1 MG/DOSE, (OZEMPIC, 1 MG/DOSE,) 4 MG/3ML SOPN [id number] No Inject 1 mg into the skin once a week.  Patient not taking: Reported on 09/23/2020   [name] , MD Not Taking Active           Patient Active Problem List   Diagnosis Date Noted  . Finger pain, right 04/20/2020  . Tobacco abuse 03/18/2020  . Hypertension associated with diabetes (HCC) 03/18/2020  .  Mixed hyperlipidemia 03/18/2020  . Nocturia 03/31/2019  . Fatigue 03/31/2019  . Neuropathy 08/27/2018  . Left shoulder pain 09/05/2017  . Cough 04/30/2015  . Heme positive stool 03/07/2014  . Shoulder pain, right 02/10/2014  . Elevated liver function tests 10/27/2013  . Alcohol abuse 10/27/2013  . GERD (gastroesophageal reflux disease) 07/18/2013  . Kidney stones 07/18/2013  . Essential hypertension, benign 07/16/2013  . Diabetes (HCC)  07/16/2013  . Hypercholesterolemia 07/16/2013    Conditions to be addressed/monitored: HTN, HLD and DMII  Care Plan : Medication Management  Updates made by [name], RPH-CPP since 09/23/2020 12:00 AM    Problem: Diabetes, HTN     Long-Range Goal: Disease Progression Prevention   Recent Progress: On track  Priority: High  Note:   Current Barriers:  . Unable to achieve control of diabetes   Pharmacist Clinical Goal(s):  Marland Kitchen Over the next 90 days, patient will achieve control of diabetes as evidenced by A1c  through collaboration with PharmD and provider.   Interventions: . 1:1 collaboration with [name] [place], MD regarding development and update of comprehensive plan of care as evidenced by provider attestation and co-signature . Inter-disciplinary care team collaboration (see longitudinal plan of care) . Comprehensive medication review performed; medication list updated in electronic medical record  Diabetes: . Uncontrolled; current treatment: Ozempic 1 mg weekly, Jardiance 25 mg daily, metformin XR 1000 mg BID - reports that with the new year, his insurance "isn't covering: Ozempic. He did not notify our office of this. He also was planning on using an old script for metformin, but this was expired and he did not notify our office of this until last week. Just started metformin last night. Reports he took 1000 mg last night and 1000 mg this morning . Current glucose readings: using CGM, but was unable to stop and read off readings to me during our call. Reports readings remaining in the ~180-250s . Patient requests to hold off on adding back Ozempic until we see full benefit of metformin + Jardiance. Continue metformin XR 1000 mg BID + Jardiance 25 mg daily. Discussed that starting at 1000 mg BID could cause GI upset, and he can reduce to 500 mg BID if he develops nausea, stomach upset, diarrhea, and increase back to 1000 mg BID as tolerated.  Marland Kitchen Contacted [facility]. They  checked into Ozempic and it is covered, but copay is $75. If Ozempic needs to be added back moving forward, will discuss use of savings card, and will also plan to titrate, likely 0.25 x 2 weeks, 0.5 x 3 weeks (1 script of 0.25/0.5 mg strength needed), then increase back to 1 mg weekly.   Hypertension: . Controlled per last office visit; current treatment: lisinopril 20 mg daily  . Recommended to continue current regimen at this time  Hyperlipidemia: . Uncontrolled on last lab work though likely improved since subsequent dose increase; current treatment: rosuvastatin 40 mg daily;  . Recommended to continue current regimen at this time. Due for updated lipid panel  Peripheral Neuropathy: . Controlled per patient report; currnet regimen gabapentin 300 mg TID . Continue current regimen at this time  GERD: . Controlled per patient report. Current regimen: pantoprazole 20 mg daily . Could consider trial/taper to H2RA in the future. Continue current regimen at this time  Patient Goals/Self-Care Activities . Over the next 90 days, patient will:  - take medications as prescribed check glucose TID using CGM, document, and provide at future appointments  Follow Up Plan:  Telephone follow up appointment with care management team member scheduled for: ~ 4 weeks     Medication Assistance:  None required.  Patient affirms current coverage meets needs.  Follow Up:  Patient agrees to Care Plan and Follow-up.  Plan: Telephone follow up appointment with care management team member scheduled for:  ~ 4 weeks  [name], PharmD, [place], CPP Clinical Pharmacist [facility] [employer] [facility] [phone]

## 2020-10-16 ENCOUNTER — Other Ambulatory Visit: Payer: Self-pay | Admitting: Podiatry

## 2020-10-16 NOTE — Telephone Encounter (Signed)
Please advise 

## 2020-10-23 ENCOUNTER — Telehealth: Payer: Self-pay | Admitting: Internal Medicine

## 2020-10-23 ENCOUNTER — Ambulatory Visit: Payer: Self-pay | Admitting: Pharmacist

## 2020-10-23 DIAGNOSIS — E7800 Pure hypercholesterolemia, unspecified: Secondary | ICD-10-CM

## 2020-10-23 DIAGNOSIS — G629 Polyneuropathy, unspecified: Secondary | ICD-10-CM

## 2020-10-23 DIAGNOSIS — I10 Essential (primary) hypertension: Secondary | ICD-10-CM

## 2020-10-23 DIAGNOSIS — E1165 Type 2 diabetes mellitus with hyperglycemia: Secondary | ICD-10-CM

## 2020-10-23 MED ORDER — OZEMPIC (1 MG/DOSE) 4 MG/3ML ~~LOC~~ SOPN: 1.0000 mg | PEN_INJECTOR | SUBCUTANEOUS | 3 refills | Status: DC

## 2020-10-23 NOTE — Chronic Care Management (AMB) (Signed)
Care Management   Pharmacy Note  10/23/2020 Name: Phillip Shepard MRN: [id number] DOB: 24-Mar-1966  Subjective: Phillip Shepard is a 55 y.o. year old male who is a primary care patient of Phillip [place], MD. The Care Management team was consulted for assistance with care management and care coordination needs.    Engaged with patient by telephone for reponse to medication management call in response to provider referral for pharmacy case management and/or care coordination services.   The patient was given information about Care Management services today including:  1. Care Management services includes personalized support from designated clinical staff supervised by the patient's primary care provider, including individualized plan of care and coordination with other care providers. 2. 24/7 contact phone numbers for assistance for urgent and routine care needs. 3. The patient may stop case management services at any time by phone call to the office staff.  Patient agreed to services and consent obtained.  Assessment:  Review of patient status, including review of consultants reports, laboratory and other test data, was performed as part of comprehensive evaluation and provision of chronic care management services.   SDOH (Social Determinants of Health) assessments and interventions performed:    Objective:  Lab Results  Component Value Date   CREATININE 1.03 03/18/2020   CREATININE 0.92 03/28/2019   CREATININE 0.92 12/14/2018    Lab Results  Component Value Date   HGBA1C 9.3 (H) 03/26/2020       Component Value Date/Time   CHOL 215 (H) 03/26/2020 0834   TRIG 361.0 (H) 03/26/2020 0834   HDL 68.60 03/26/2020 0834   CHOLHDL 3 03/26/2020 0834   VLDL 72.2 (H) 03/26/2020 0834   LDLCALC 129 (H) 02/03/2014 1103   LDLDIRECT 90.0 03/26/2020 0834    Clinical ASCVD: No  The 10-year ASCVD risk score Phillip George DC Jr., et al., 2013) is: 10.6%   Values used to calculate the score:      Age: 1 years     Sex: Male     Is Non-Hispanic African American: No     Diabetic: Yes     Tobacco smoker: No     Systolic Blood Pressure: 124 mmHg     Is BP treated: Yes     HDL Cholesterol: 68.6 mg/dL     Total Cholesterol: 215 mg/dL     BP Readings from Last 3 Encounters:  06/09/20 132/76  04/14/20 138/70  03/18/20 122/76    Care Plan  No Known Allergies  Medications Reviewed Today    Reviewed by [name], RPH-CPP (Pharmacist) on 09/23/20 at 1021  Med List Status: <None>  Medication Order Taking? Sig Documenting Provider Last Dose Status Informant  Continuous Blood Gluc Sensor (FREESTYLE LIBRE 2 SENSOR) MISC [id number] Yes Use to check glucose at least 4 times daily Phillip [place], MD Taking Active   gabapentin (NEURONTIN) 300 MG capsule [id number] Yes TAKE 1 CAPSULE BY MOUTH THREE TIMES A DAY Phillip Shepard, DPM Taking Active   JARDIANCE 25 MG TABS tablet [id number] Yes TAKE 1 TABLET BY MOUTH EVERY DAY Phillip [place], MD Taking Active   lisinopril (ZESTRIL) 20 MG tablet [id number] Yes TAKE 1 TABLET BY MOUTH EVERY DAY Phillip [place], MD Taking Active   metFORMIN (GLUCOPHAGE XR) 500 MG 24 hr tablet [id number] Yes Take 2 tablets (1,000 mg total) by mouth in the morning and at bedtime. Phillip [place], MD Taking Active   omeprazole (PRILOSEC) 20 MG capsule [id number]  Take 20 mg by mouth daily. [provider]  Active   rosuvastatin (CRESTOR) 40 MG tablet [id number] Yes TAKE 1 TABLET BY MOUTH EVERY DAY Phillip [place], MD Taking Active   Semaglutide, 1 MG/DOSE, (OZEMPIC, 1 MG/DOSE,) 4 MG/3ML SOPN [id number] No Inject 1 mg into the skin once a week.  Patient not taking: Reported on 09/23/2020   Phillip [place], MD Not Taking Active           Patient Active Problem List   Diagnosis Date Noted  . Finger pain, right 04/20/2020  . Tobacco abuse 03/18/2020  . Hypertension associated with diabetes (HCC) 03/18/2020  . Mixed hyperlipidemia 03/18/2020  . Nocturia  03/31/2019  . Fatigue 03/31/2019  . Neuropathy 08/27/2018  . Left shoulder pain 09/05/2017  . Cough 04/30/2015  . Heme positive stool 03/07/2014  . Shoulder pain, right 02/10/2014  . Elevated liver function tests 10/27/2013  . Alcohol abuse 10/27/2013  . GERD (gastroesophageal reflux disease) 07/18/2013  . Kidney stones 07/18/2013  . Essential hypertension, benign 07/16/2013  . Diabetes (HCC) 07/16/2013  . Hypercholesterolemia 07/16/2013    Conditions to be addressed/monitored: HTN and DMII  Care Plan : Medication Management  Updates made by [name], RPH-CPP since 10/23/2020 12:00 AM    Problem: Diabetes, HTN     Long-Range Goal: Disease Progression Prevention   Recent Progress: On track  Priority: High  Note:   Current Barriers:  . Unable to achieve control of diabetes   Pharmacist Clinical Goal(s):  Phillip Shepard Over the next 90 days, patient will achieve control of diabetes as evidenced by A1c  through collaboration with PharmD and provider.   Interventions: . 1:1 collaboration with Phillip [place], MD regarding development and update of comprehensive plan of care as evidenced by provider attestation and co-signature . Inter-disciplinary care team collaboration (see longitudinal plan of care) . Comprehensive medication review performed; medication list updated in electronic medical record  Diabetes: . Uncontrolled; current treatment: Jardiance 25 mg daily, metformin XR 1000 mg BID - at last visit, he had wanted to retry metformin. Calls today to report that his joints hurt and he overall feels bad, would like to stop metformin and get back on Ozempic.  Phillip Shepard Has been off Ozempic for ~ 1-2 months. Discussed that restarting Ozempic at 1 mg could cause some stomach upset. If this happens, patient will contact me and we will discuss a different titration plan.  . Discontinue metformin. Continue Jardiance 25 mg daily  . Discussed use of Ozempic savings card on manufacturer website  to bring cost down to $25/3 months  Hypertension: . Controlled per last office visit; current treatment: lisinopril 20 mg daily  . Recommended to continue current regimen at this time  Hyperlipidemia: . Uncontrolled on last lab work though likely improved since subsequent dose increase; current treatment: rosuvastatin 40 mg daily;  . Recommended to continue current regimen at this time. Due for updated lipid panel  Peripheral Neuropathy: . Controlled per patient report; currnet regimen gabapentin 300 mg TID . Continue current regimen at this time  GERD: . Controlled per patient report. Current regimen: pantoprazole 20 mg daily . Could consider trial/taper to H2RA in the future. Continue current regimen at this time  Patient Goals/Self-Care Activities . Over the next 90 days, patient will:  - take medications as prescribed check glucose TID using CGM, document, and provide at future appointments  Follow Up Plan: Telephone follow up appointment with care management team member scheduled for: ~ 5 weeks     Medication Assistance:  None required.  Patient affirms  current coverage meets needs.  Follow Up:  Patient agrees to Care Plan and Follow-up.  Plan: Telephone follow up appointment with care management team member scheduled for:  ~ 5 weeks  [name], PharmD, [place], CPP Clinical Pharmacist [facility] at [employer] [phone]

## 2020-10-23 NOTE — Patient Instructions (Addendum)
Visit Information  Goals Addressed              This Visit's Progress     Patient Stated   .  Medication Monitoring (pt-stated)        Patient Goals/Self-Care Activities . Over the next 90 days, patient will:  - take medications as prescribed check glucose at least three times daily using CGM, document, and provide at future           Patient verbalizes understanding of instructions provided today and agrees to view in MyChart.   Plan: Telephone follow up appointment with care management team member scheduled for:  ~ 5 weeks  [name], PharmD, [place], CPP Clinical Pharmacist [facility] at [employer] [facility] [phone]

## 2020-10-23 NOTE — Telephone Encounter (Signed)
Patient called and wanted to speak to Catie. Patient would like to go back on Ozempic. Patient ask if Catie would please call him.

## 2020-10-29 ENCOUNTER — Telehealth: Payer: Self-pay

## 2020-11-29 ENCOUNTER — Other Ambulatory Visit: Payer: Self-pay | Admitting: Internal Medicine

## 2020-12-04 ENCOUNTER — Telehealth: Payer: Self-pay

## 2020-12-11 ENCOUNTER — Telehealth: Payer: Self-pay

## 2020-12-11 ENCOUNTER — Telehealth: Payer: Self-pay | Admitting: Pharmacist

## 2020-12-11 NOTE — Telephone Encounter (Signed)
  Chronic Care Management   Note  12/11/2020 Name: Phillip Shepard MRN: [id number] DOB: 30-Dec-1965   Attempted to contact patient for scheduled appointment for medication management support. Left HIPAA compliant message for patient to return my call at their convenience. Of note, also let him know he was overdue for follow up with Dr. Lorin Picket and he needed to call in to schedule  Plan: - If I do not hear back from the patient by end of business today, will collaborate with Care Guide to outreach to schedule follow up with me  [name], PharmD, [place], CPP Clinical Pharmacist [facility] at [employer] [phone]

## 2020-12-14 ENCOUNTER — Telehealth: Payer: Self-pay

## 2020-12-14 NOTE — Chronic Care Management (AMB) (Signed)
  Care Management   Note  12/14/2020 Name: Phillip Shepard MRN: [id number] DOB: 1966/04/15  Phillip Shepard is a 55 y.o. year old male who is a primary care patient of [name] [place], MD and is actively engaged with the care management team. I reached out to Francoise Ceo by phone today to assist with re-scheduling a follow up visit with the Pharmacist  Follow up plan: Unsuccessful telephone outreach attempt made. A HIPAA compliant phone message was left for the patient providing contact information and requesting a return call.  The care management team will reach out to the patient again over the next 5 days.  If patient returns call to provider office, please advise to call Embedded Care Management Care Guide Penne Lash  at [phone]  Penne Lash, RMA Care Guide, Embedded Care Coordination [facility]  [place], [place] [address] Direct Dial: [phone] [email or web address] Website: [email or web address]

## 2020-12-22 ENCOUNTER — Telehealth: Payer: Self-pay

## 2020-12-22 NOTE — Chronic Care Management (AMB) (Signed)
  Care Management   Note  12/22/2020 Name: Phillip Shepard MRN: [id number] DOB: 11/10/1965  Phillip Shepard is a 55 y.o. year old male who is a primary care patient of [name] [place], MD and is actively engaged with the care management team. I reached out to Francoise Ceo by phone today to assist with re-scheduling a follow up visit with the Pharmacist  Follow up plan: Unsuccessful telephone outreach attempt made. A HIPAA compliant phone message was left for the patient providing contact information and requesting a return call.  The care management team will reach out to the patient again over the next 7 days.  If patient returns call to provider office, please advise to call Embedded Care Management Care Guide Penne Lash  at [phone]  Penne Lash, RMA Care Guide, Embedded Care Coordination [facility]  [place], [place] [address] Direct Dial: [phone] [email or web address] Website: [email or web address]

## 2020-12-28 NOTE — Telephone Encounter (Signed)
[facility] has been notified and rescheduled for 01/15/2021

## 2021-01-15 ENCOUNTER — Ambulatory Visit: Payer: Self-pay | Admitting: Pharmacist

## 2021-01-15 DIAGNOSIS — E7800 Pure hypercholesterolemia, unspecified: Secondary | ICD-10-CM

## 2021-01-15 DIAGNOSIS — I10 Essential (primary) hypertension: Secondary | ICD-10-CM

## 2021-01-15 DIAGNOSIS — E1165 Type 2 diabetes mellitus with hyperglycemia: Secondary | ICD-10-CM

## 2021-01-15 MED ORDER — EMPAGLIFLOZIN 25 MG PO TABS: 25.0000 mg | ORAL_TABLET | Freq: Every day | ORAL | 3 refills | Status: DC

## 2021-01-15 MED ORDER — OZEMPIC (2 MG/DOSE) 8 MG/3ML ~~LOC~~ SOPN: 2.0000 mg | PEN_INJECTOR | SUBCUTANEOUS | 1 refills | Status: DC

## 2021-01-15 NOTE — Chronic Care Management (AMB) (Signed)
Care Management   Pharmacy Note  01/15/2021 Name: Phillip Shepard MRN: [id number] DOB: [birth year]  Subjective: Phillip Shepard is a 56 y.o. year old male who is a primary care patient of Phillip [place], Shepard. The Care Management team was consulted for assistance with care management and care coordination needs.    Engaged with patient by telephone for follow up visit in response to provider referral for pharmacy case management and/or care coordination services.   The patient was given information about Care Management services today including:  1. Care Management services includes personalized support from designated clinical staff supervised by the patient's primary care provider, including individualized plan of care and coordination with other care providers. 2. 24/7 contact phone numbers for assistance for urgent and routine care needs. 3. The patient may stop case management services at any time by phone call to the office staff.  Patient agreed to services and consent obtained.  Assessment:  Review of patient status, including review of consultants reports, laboratory and other test data, was performed as part of comprehensive evaluation and provision of chronic care management services.   SDOH (Social Determinants of Health) assessments and interventions performed:  SDOH Interventions   Flowsheet Row Most Recent Value  SDOH Interventions   Financial Strain Interventions Intervention Not Indicated       Objective:  Lab Results  Component Value Date   CREATININE 1.03 03/18/2020   CREATININE 0.92 03/28/2019   CREATININE 0.92 12/14/2018    Lab Results  Component Value Date   HGBA1C 9.3 (H) 03/26/2020       Component Value Date/Time   CHOL 215 (H) 03/26/2020 0834   TRIG 361.0 (H) 03/26/2020 0834   HDL 68.60 03/26/2020 0834   CHOLHDL 3 03/26/2020 0834   VLDL 72.2 (H) 03/26/2020 0834   LDLCALC 129 (H) 02/03/2014 1103   LDLDIRECT 90.0 03/26/2020 0834   Clinical  ASCVD: No  The 10-year ASCVD risk score [name], et al., 2013) is: 10.6%   Values used to calculate the score:     Age: 69 years     Sex: Male     Is Non-Hispanic African American: No     Diabetic: Yes     Tobacco smoker: No     Systolic Blood Pressure: 124 mmHg     Is BP treated: Yes     HDL Cholesterol: 68.6 mg/dL     Total Cholesterol: 215 mg/dL     BP Readings from Last 3 Encounters:  06/09/20 132/76  04/14/20 138/70  03/18/20 122/76    Care Plan  No Known Allergies  Medications Reviewed Today    Reviewed by Phillip Shepard, RPH-CPP (Pharmacist) on 01/15/21 at 1452  Med List Status: <None>  Medication Order Taking? Sig Documenting Provider Last Dose Status Informant  Continuous Blood Gluc Sensor (FREESTYLE LIBRE 2 SENSOR) MISC [id number] No Use to check glucose at least 4 times daily  Patient not taking: Reported on 01/15/2021   Phillip [place], Shepard Not Taking Active   gabapentin (NEURONTIN) 300 MG capsule [id number] No TAKE 1 CAPSULE BY MOUTH THREE TIMES A DAY  Patient not taking: Reported on 01/15/2021   Phillip Shepard, DPM Not Taking Active   JARDIANCE 25 MG TABS tablet [id number] Yes TAKE 1 TABLET BY MOUTH EVERY DAY Phillip [place], Shepard Taking Active   lisinopril (ZESTRIL) 20 MG tablet [id number] Yes TAKE 1 TABLET BY MOUTH EVERY DAY Phillip Shepard Taking Active   omeprazole (PRILOSEC) 20 MG capsule [id number]  Yes Take 20 mg by mouth daily. Provider, Historical, Shepard Taking Active   rosuvastatin (CRESTOR) 40 MG tablet [id number] Yes TAKE 1 TABLET BY MOUTH EVERY DAY Phillip [place], Shepard Taking Active   Semaglutide, 1 MG/DOSE, (OZEMPIC, 1 MG/DOSE,) 4 MG/3ML SOPN [id number] Yes Inject 1 mg into the skin once a week. Phillip [place], Shepard Taking Active           Patient Active Problem List   Diagnosis Date Noted  . Finger pain, right 04/20/2020  . Tobacco abuse 03/18/2020  . Hypertension associated with diabetes (HCC) 03/18/2020  . Mixed hyperlipidemia 03/18/2020  .  Nocturia 03/31/2019  . Fatigue 03/31/2019  . Neuropathy 08/27/2018  . Left shoulder pain 09/05/2017  . Cough 04/30/2015  . Heme positive stool 03/07/2014  . Shoulder pain, right 02/10/2014  . Elevated liver function tests 10/27/2013  . Alcohol abuse 10/27/2013  . GERD (gastroesophageal reflux disease) 07/18/2013  . Kidney stones 07/18/2013  . Essential hypertension, benign 07/16/2013  . Diabetes (HCC) 07/16/2013  . Hypercholesterolemia 07/16/2013    Conditions to be addressed/monitored: HTN, HLD and DMII  Care Plan : Medication Management  Updates made by Phillip Shepard, RPH-CPP since 01/15/2021 12:00 AM    Problem: Diabetes, HTN     Long-Range Goal: Disease Progression Prevention   This Visit's Progress: On track  Recent Progress: On track  Priority: High  Note:   Current Barriers:  . Unable to achieve control of diabetes   Pharmacist Clinical Goal(s):  Marland Kitchen Over the next 90 days, patient will achieve control of diabetes as evidenced by A1c  through collaboration with PharmD and provider.   Interventions: . 1:1 collaboration with Phillip [place], Shepard regarding development and update of comprehensive plan of care as evidenced by provider attestation and co-signature . Inter-disciplinary care team collaboration (see longitudinal plan of care) . Comprehensive medication review performed; medication list updated in electronic medical record  Health Maintenance: . Reports that his wife has COVID for the second time. Notes that he has no symptoms. Advised he pursue COVID vaccination, he declined. Reminded that if he ever changes his mind and would like to discuss to call me.  Diabetes: . Uncontrolled; current treatment: Jardiance 25 mg daily, Ozempic 1 mg weekly o Hx metformin XR - reported joint pain . Denies any GI upset, nausea, vomiting, stomach upset . Current glucose readings: fastings; 180-200s; 2 hour post prandial: 170-200s . Decided against using Libre 2 CGM for  cash price . Discussed potential to increase Ozempic to 2 mg weekly. Patient amenable. Counseled that he may experience increased nausea, decreased appetite, constipation when stepping dose up. Contact our office if severe GI symptoms.  Marland Kitchen Resent Jardiance for 90 day supply to aid in adherence.   Hypertension: . Controlled per last office visit; current treatment: lisinopril 20 mg daily  . Refill history up to date-  last fill 11/06/20 for 90 day . Recommended to continue current regimen at this time  Hyperlipidemia: . Uncontrolled on last lab work though likely improved since subsequent dose increase; current treatment: rosuvastatin 40 mg daily;  . Refill history up to date-  last fill 11/06/20 for 90 day . Recommended to continue current regimen at this time. Due for updated lipid panel  Peripheral Neuropathy: . Worsened per patient report; current regimen: gabapentin 300 mg TID- notes he is not taking this because the pharmacy wouldn't fill it once . Reviewed that a refill was sent in march for a 90 day supply with refills. Advised to  contact the pharmacy to ask that they fill this medication  GERD: . Controlled per patient report. Current regimen: pantoprazole 20 mg daily . Could consider trial/taper to H2RA in the future. Continue current regimen at this time  Patient Goals/Self-Care Activities . Over the next 90 days, patient will:  - take medications as prescribed check glucose twice weekly, document, and provide at future appointments  Follow Up Plan: Telephone follow up appointment with care management team member scheduled for: ~ 4 weeks     Medication Assistance:  None required.  Patient affirms current coverage meets needs.  Follow Up:  Patient agrees to Care Plan and Follow-up.  Plan: Telephone follow up appointment with care management team member scheduled for:  ~ 5 weeks  [name], PharmD, [place], CPP Clinical Pharmacist [facility] at [employer] [phone]

## 2021-01-15 NOTE — Patient Instructions (Signed)
Visit Information  Goals Addressed              This Visit's Progress     Patient Stated   .  Medication Monitoring (pt-stated)        Patient Goals/Self-Care Activities . Over the next 90 days, patient will:  - take medications as prescribed check glucose at twice daily, document, and provide at future        Patient verbalizes understanding of instructions provided today and agrees to view in MyChart.  Plan: Telephone follow up appointment with care management team member scheduled for:  ~ 5 weeks  [name], PharmD, [place], CPP Clinical Pharmacist [facility] at [employer] [facility] [phone]

## 2021-01-22 IMAGING — DX DG HAND COMPLETE 3+V*R*
3 series · 3 of 3 positions shown · non-contrast
Comparison: None.

CLINICAL DATA: Right MCP pain

EXAM:
RIGHT HAND - COMPLETE 3+ VIEW

[hand pa]
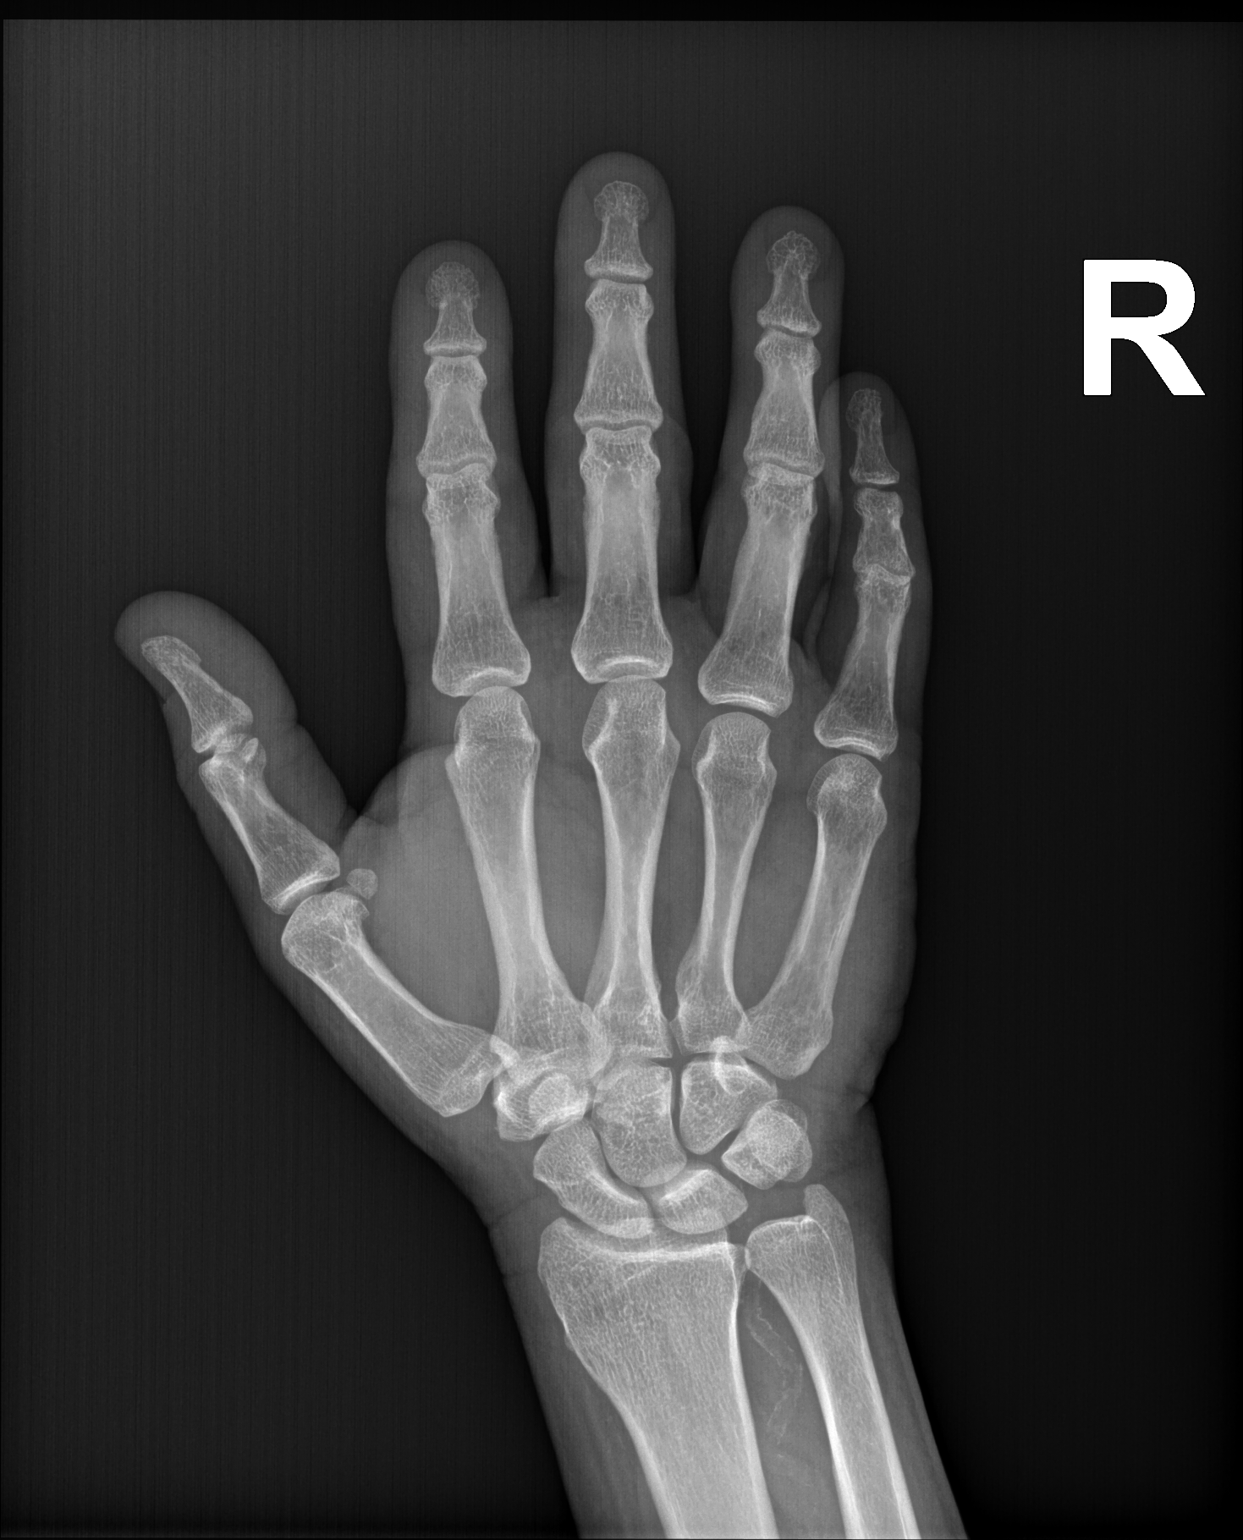

[hand obl (oblique)]
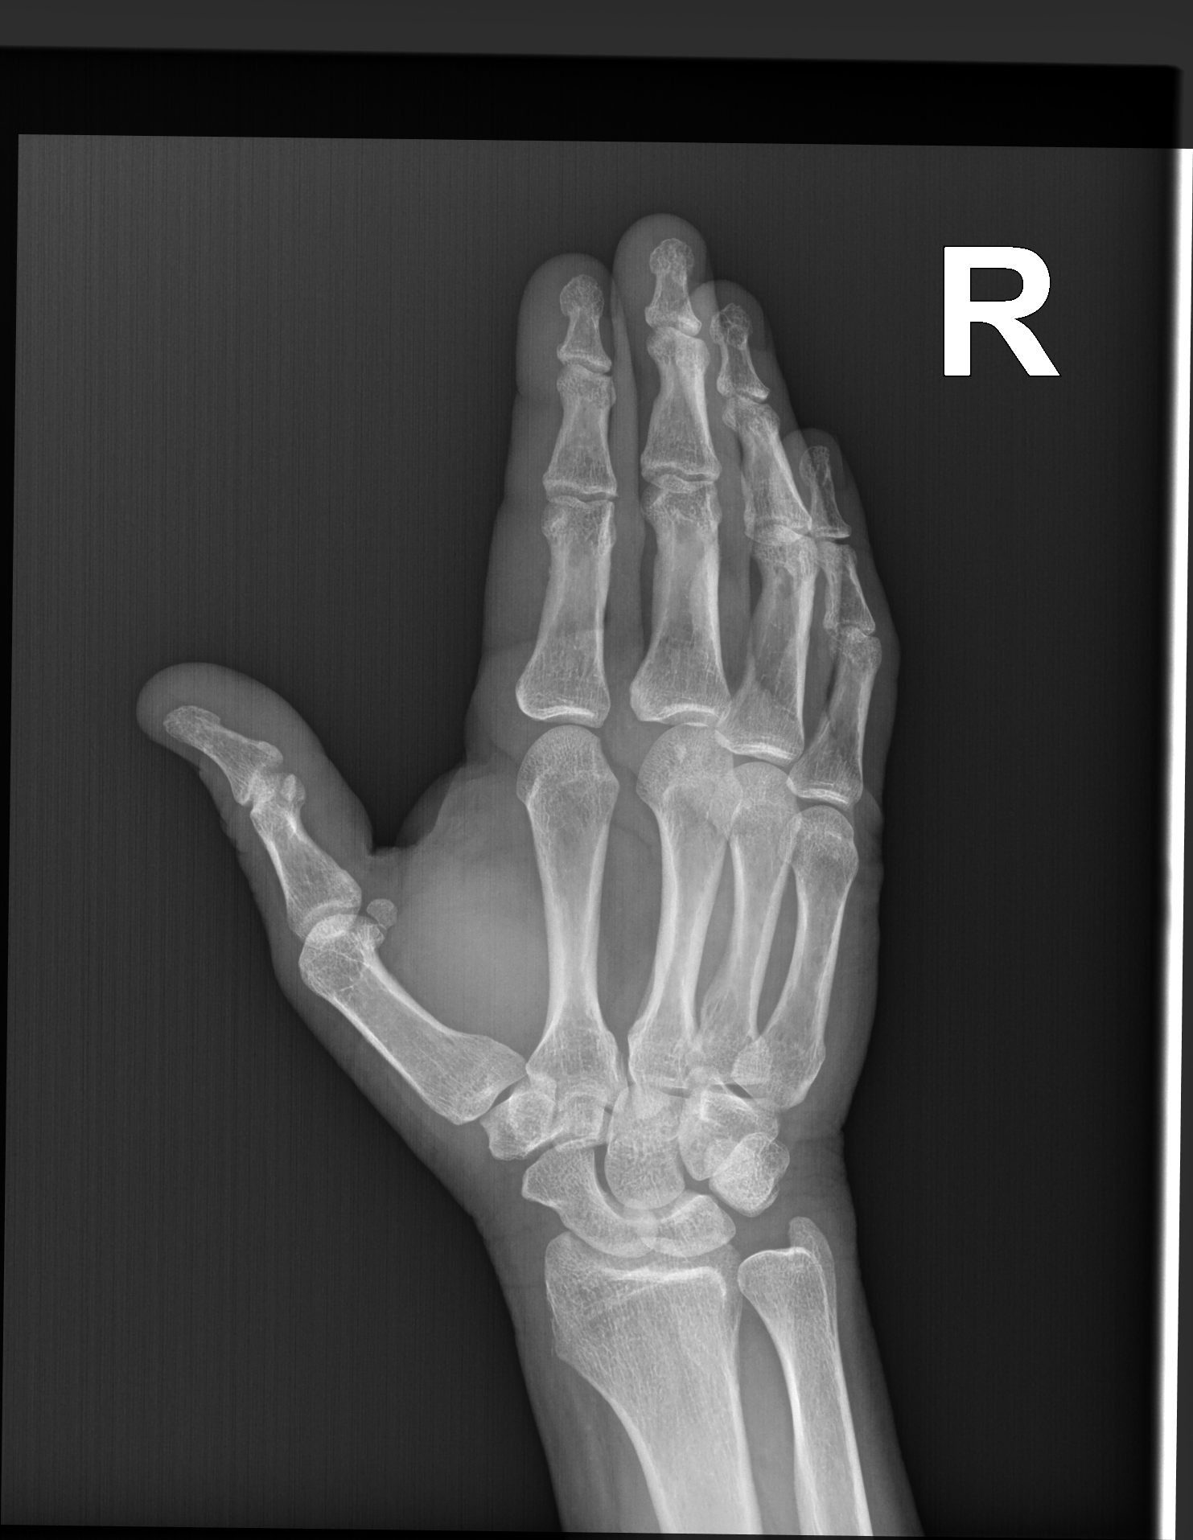

[hand lat]
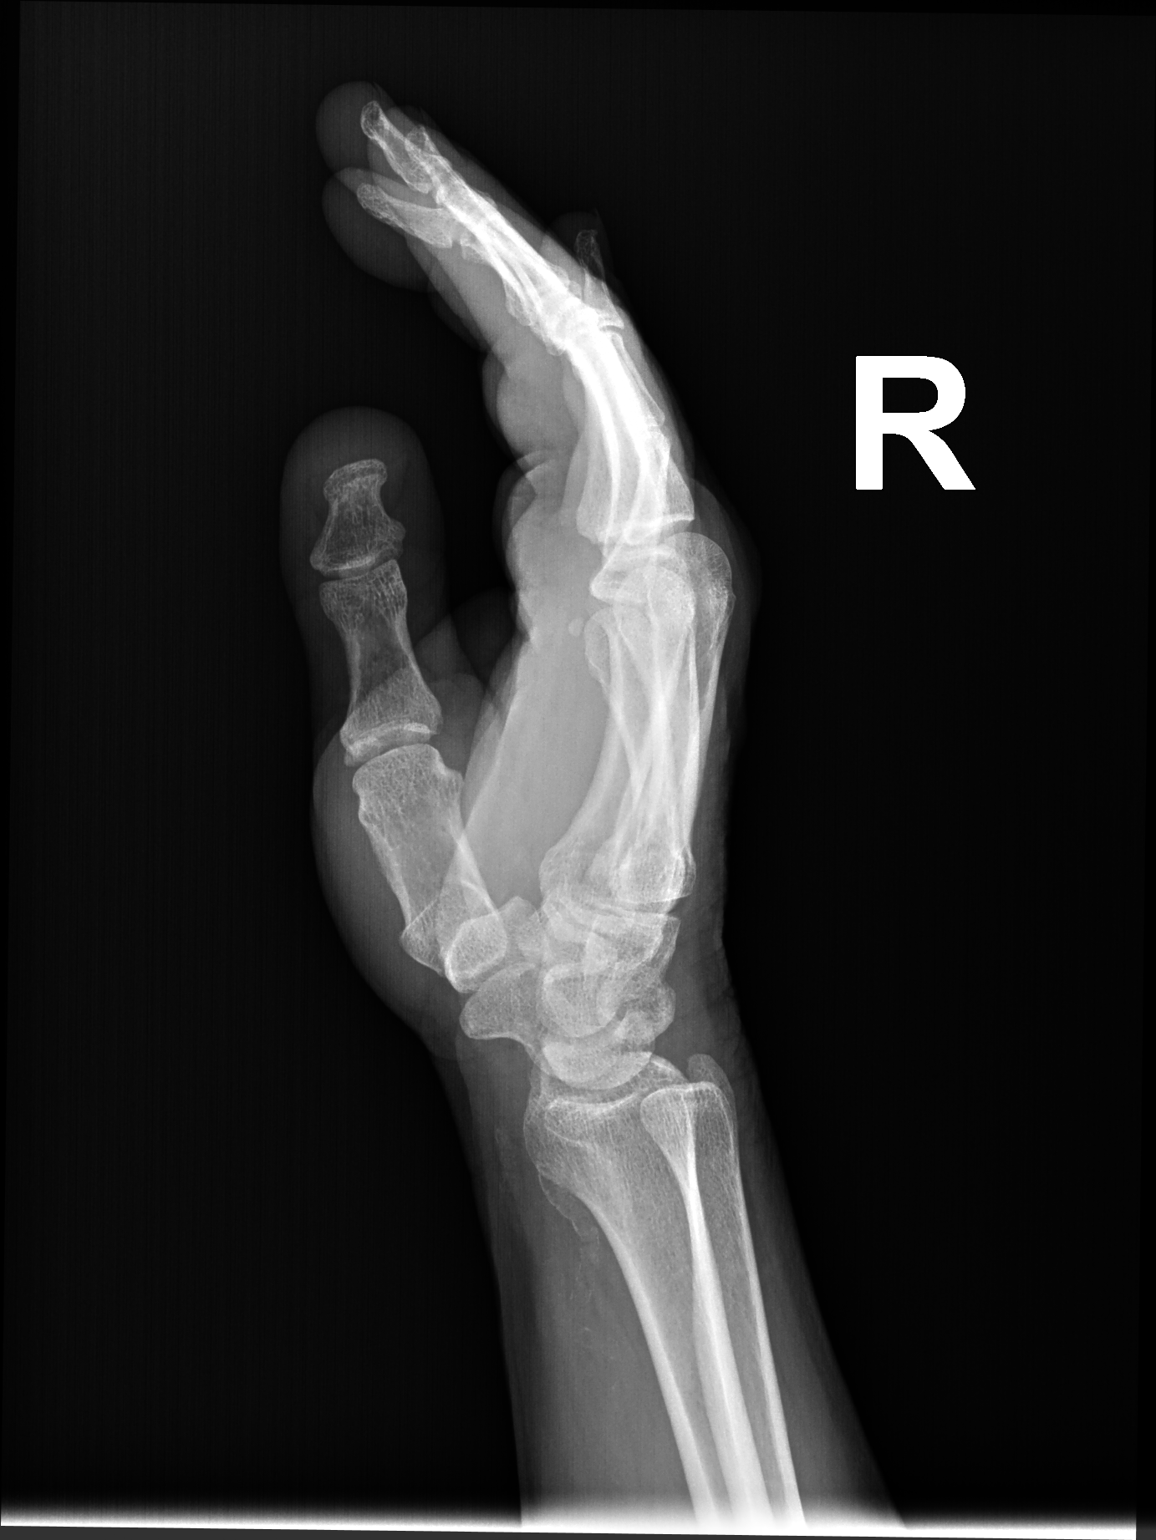

[3 of 3 positions shown; findings below may reference images not displayed]

FINDINGS: There is no evidence of fracture or dislocation. There is no
evidence of arthropathy or other focal bone abnormality. Soft
tissues are unremarkable. Vascular calcifications.
IMPRESSION: Negative.

## 2021-01-27 ENCOUNTER — Ambulatory Visit: Payer: Self-pay | Admitting: Pharmacist

## 2021-01-27 DIAGNOSIS — E7800 Pure hypercholesterolemia, unspecified: Secondary | ICD-10-CM

## 2021-01-27 DIAGNOSIS — I10 Essential (primary) hypertension: Secondary | ICD-10-CM

## 2021-01-27 DIAGNOSIS — E1165 Type 2 diabetes mellitus with hyperglycemia: Secondary | ICD-10-CM

## 2021-01-27 NOTE — Chronic Care Management (AMB) (Signed)
Care Management   Pharmacy Note  01/27/2021 Name: Phillip Shepard MRN: [id number] DOB: [birth year]  Subjective: Phillip Shepard is a 55 y.o. year old male who is a primary care patient of [name] [place], MD. The Care Management team was consulted for assistance with care management and care coordination needs.    Engaged with patient by telephone for response to call with medication questions in response to provider referral for pharmacy case management and/or care coordination services.   The patient was given information about Care Management services today including:  1. Care Management services includes personalized support from designated clinical staff supervised by the patient's primary care provider, including individualized plan of care and coordination with other care providers. 2. 24/7 contact phone numbers for assistance for urgent and routine care needs. 3. The patient may stop case management services at any time by phone call to the office staff.  Patient agreed to services and consent obtained.  Assessment:  Review of patient status, including review of consultants reports, laboratory and other test data, was performed as part of comprehensive evaluation and provision of chronic care management services.   SDOH (Social Determinants of Health) assessments and interventions performed:    Objective:  Lab Results  Component Value Date   CREATININE 1.03 03/18/2020   CREATININE 0.92 03/28/2019   CREATININE 0.92 12/14/2018    Lab Results  Component Value Date   HGBA1C 9.3 (H) 03/26/2020       Component Value Date/Time   CHOL 215 (H) 03/26/2020 0834   TRIG 361.0 (H) 03/26/2020 0834   HDL 68.60 03/26/2020 0834   CHOLHDL 3 03/26/2020 0834   VLDL 72.2 (H) 03/26/2020 0834   LDLCALC 129 (H) 02/03/2014 1103   LDLDIRECT 90.0 03/26/2020 0834    Clinical ASCVD: No  The 10-year ASCVD risk score [name], et al., 2013) is: 10.6%   Values used to calculate the  score:     Age: 70 years     Sex: Male     Is Non-Hispanic African American: No     Diabetic: Yes     Tobacco smoker: No     Systolic Blood Pressure: 124 mmHg     Is BP treated: Yes     HDL Cholesterol: 68.6 mg/dL     Total Cholesterol: 215 mg/dL     BP Readings from Last 3 Encounters:  06/09/20 132/76  04/14/20 138/70  03/18/20 122/76    Care Plan  No Known Allergies  Medications Reviewed Today    Reviewed by [name], RPH-CPP (Pharmacist) on 01/15/21 at 1452  Med List Status: <None>  Medication Order Taking? Sig Documenting Provider Last Dose Status Informant  Continuous Blood Gluc Sensor (FREESTYLE LIBRE 2 SENSOR) MISC [id number] No Use to check glucose at least 4 times daily  Patient not taking: Reported on 01/15/2021   [name] [place], MD Not Taking Active   gabapentin (NEURONTIN) 300 MG capsule [id number] No TAKE 1 CAPSULE BY MOUTH THREE TIMES A DAY  Patient not taking: Reported on 01/15/2021   [name], DPM Not Taking Active   JARDIANCE 25 MG TABS tablet [id number] Yes TAKE 1 TABLET BY MOUTH EVERY DAY [name] [place], MD Taking Active   lisinopril (ZESTRIL) 20 MG tablet [id number] Yes TAKE 1 TABLET BY MOUTH EVERY DAY [name] [place], MD Taking Active   omeprazole (PRILOSEC) 20 MG capsule [id number] Yes Take 20 mg by mouth daily. [provider] Taking Active   rosuvastatin (CRESTOR) 40 MG tablet [id number]  Yes TAKE 1 TABLET BY MOUTH EVERY DAY [name] [place], MD Taking Active   Semaglutide, 1 MG/DOSE, (OZEMPIC, 1 MG/DOSE,) 4 MG/3ML SOPN [id number] Yes Inject 1 mg into the skin once a week. [name] [place], MD Taking Active           Patient Active Problem List   Diagnosis Date Noted  . Finger pain, right 04/20/2020  . Tobacco abuse 03/18/2020  . Hypertension associated with diabetes (HCC) 03/18/2020  . Mixed hyperlipidemia 03/18/2020  . Nocturia 03/31/2019  . Fatigue 03/31/2019  . Neuropathy 08/27/2018  . Left shoulder pain 09/05/2017  .  Cough 04/30/2015  . Heme positive stool 03/07/2014  . Shoulder pain, right 02/10/2014  . Elevated liver function tests 10/27/2013  . Alcohol abuse 10/27/2013  . GERD (gastroesophageal reflux disease) 07/18/2013  . Kidney stones 07/18/2013  . Essential hypertension, benign 07/16/2013  . Diabetes (HCC) 07/16/2013  . Hypercholesterolemia 07/16/2013    Conditions to be addressed/monitored: HTN, HLD and DMII  Care Plan : Medication Management  Updates made by [name], RPH-CPP since 01/27/2021 12:00 AM    Problem: Diabetes, HTN     Long-Range Goal: Disease Progression Prevention   This Visit's Progress: On track  Recent Progress: On track  Priority: High  Note:   Current Barriers:  . Unable to achieve control of diabetes   Pharmacist Clinical Goal(s):  Marland Kitchen Over the next 90 days, patient will achieve control of diabetes as evidenced by A1c  through collaboration with PharmD and provider.   Interventions: . 1:1 collaboration with [name] [place], MD regarding development and update of comprehensive plan of care as evidenced by provider attestation and co-signature . Inter-disciplinary care team collaboration (see longitudinal plan of care) . Comprehensive medication review performed; medication list updated in electronic medical record   Diabetes: . Uncontrolled; current treatment: Jardiance 25 mg daily, Ozempic 2 mg weekly- calls today to report that the pharmacy is having an issue filling both Jardiance and Ozempic and he wasn't sure why o Hx metformin XR - reported joint pain . Denies any GI upset, nausea, vomiting, stomach upset . Called [facility]. Ozempic requiring PA (likely because [employer] formulary has not been fully updated with new strength) and Jardiance is 1 day too soon. Completed PA for Ozempic 2 mg and informed patient he can pick up Jardiance tomorrow. Patient is going out of town, so will prepare Ozempic 2 mg sample for him to use while awaiting PA results. Patient  verbalizes understanding.   Hypertension: . Controlled per last office visit; current treatment: lisinopril 20 mg daily  . Previously recommended to continue current regimen at this time  Hyperlipidemia: . Uncontrolled on last lab work though likely improved since subsequent dose increase; current treatment: rosuvastatin 40 mg daily;  . Previously recommended to continue current regimen at this time. Due for updated lipid panel  Peripheral Neuropathy: . Previously discussed worsening per patient report; current regimen: gabapentin 300 mg TID, though he had not been taking this at our call . Previously advised to contact pharmacy to fill  GERD: . Controlled per patient report. Current regimen: pantoprazole 20 mg daily . Could consider trial/taper to H2RA in the future. Previously recommended to continue current regimen at this time  Patient Goals/Self-Care Activities . Over the next 90 days, patient will:  - take medications as prescribed check glucose twice weekly, document, and provide at future appointments  Follow Up Plan: Telephone follow up appointment with care management team member scheduled for: ~ 2 weeks as  previously scheduled     Medication Assistance:  None required.  Patient affirms current coverage meets needs.  Follow Up:  Patient agrees to Care Plan and Follow-up.  Plan: Telephone follow up appointment with care management team member scheduled for:  ~ 2 weeks as previously scheduled  [name], PharmD, [place], CPP Clinical Pharmacist [employer] at [facility] [phone]

## 2021-01-27 NOTE — Patient Instructions (Signed)
Visit Information  Goals Addressed              This Visit's Progress     Patient Stated   .  Medication Monitoring (pt-stated)        Patient Goals/Self-Care Activities . Over the next 90 days, patient will:  - take medications as prescribed check glucose at twice daily, document, and provide at future         Patient verbalizes understanding of instructions provided today and agrees to view in MyChart.   Plan: Telephone follow up appointment with care management team member scheduled for:  ~ 2 weeks as previously scheduled  [name], PharmD, [place], CPP Clinical Pharmacist [employer] at [facility] [phone]

## 2021-02-03 MED ORDER — OZEMPIC (2 MG/DOSE) 8 MG/3ML ~~LOC~~ SOPN: 2.0000 mg | PEN_INJECTOR | SUBCUTANEOUS | 1 refills | Status: DC

## 2021-02-03 NOTE — Addendum Note (Signed)
Addended by: [name] on: 02/03/2021 02:50 PM   Modules accepted: Orders

## 2021-02-03 NOTE — Chronic Care Management (AMB) (Signed)
PA for Ozempic 2 mg approved 01/27/21-01/27/22. [facility]

## 2021-02-11 ENCOUNTER — Other Ambulatory Visit: Payer: Self-pay | Admitting: Internal Medicine

## 2021-02-26 ENCOUNTER — Ambulatory Visit: Payer: Self-pay | Admitting: Pharmacist

## 2021-02-26 DIAGNOSIS — I10 Essential (primary) hypertension: Secondary | ICD-10-CM

## 2021-02-26 DIAGNOSIS — E1165 Type 2 diabetes mellitus with hyperglycemia: Secondary | ICD-10-CM

## 2021-02-26 DIAGNOSIS — E7800 Pure hypercholesterolemia, unspecified: Secondary | ICD-10-CM

## 2021-02-26 NOTE — Patient Instructions (Signed)
Visit Information   Goals Addressed               This Visit's Progress     Patient Stated     Medication Monitoring (pt-stated)        Patient Goals/Self-Care Activities Over the next 90 days, patient will:  - take medications as prescribed check glucose at twice daily, document, and provide at future            Patient verbalizes understanding of instructions provided today and agrees to view in MyChart.   Plan: Telephone follow up appointment with care management team member scheduled for:  ~ 10 weeks  [name], PharmD, [place], CPP Clinical Pharmacist [employer] at [employer] [phone]

## 2021-02-26 NOTE — Chronic Care Management (AMB) (Signed)
Care Management   Pharmacy Note  02/26/2021 Name: Phillip Shepard MRN: [id number] DOB: [birth year]  Subjective: Phillip Shepard is a 55 y.o. year old male who is a primary care patient of [name], MD. The Care Management team was consulted for assistance with care management and care coordination needs.    Engaged with patient by telephone for follow up visit in response to provider referral for pharmacy case management and/or care coordination services.   The patient was given information about Care Management services today including:  Care Management services includes personalized support from designated clinical staff supervised by the patient's primary care provider, including individualized plan of care and coordination with other care providers. 24/7 contact phone numbers for assistance for urgent and routine care needs. The patient may stop case management services at any time by phone call to the office staff.  Patient agreed to services and consent obtained.  Assessment:  Review of patient status, including review of consultants reports, laboratory and other test data, was performed as part of comprehensive evaluation and provision of chronic care management services.   SDOH (Social Determinants of Health) assessments and interventions performed:  SDOH Interventions    Flowsheet Row Most Recent Value  SDOH Interventions   Financial Strain Interventions Other (Comment)  [copay card]        Objective:  Lab Results  Component Value Date   CREATININE 1.03 03/18/2020   CREATININE 0.92 03/28/2019   CREATININE 0.92 12/14/2018    Lab Results  Component Value Date   HGBA1C 9.3 (H) 03/26/2020       Component Value Date/Time   CHOL 215 (H) 03/26/2020 0834   TRIG 361.0 (H) 03/26/2020 0834   HDL 68.60 03/26/2020 0834   CHOLHDL 3 03/26/2020 0834   VLDL 72.2 (H) 03/26/2020 0834   LDLCALC 129 (H) 02/03/2014 1103   LDLDIRECT 90.0 03/26/2020 0834     Clinical  ASCVD: No  The 10-year ASCVD risk score [name], et al., 2013) is: 10.6%   Values used to calculate the score:     Age: 37 years     Sex: Male     Is Non-Hispanic African American: No     Diabetic: Yes     Tobacco smoker: No     Systolic Blood Pressure: 124 mmHg     Is BP treated: Yes     HDL Cholesterol: 68.6 mg/dL     Total Cholesterol: 215 mg/dL    BP Readings from Last 3 Encounters:  06/09/20 132/76  04/14/20 138/70  03/18/20 122/76    Care Plan  No Known Allergies  Medications Reviewed Today     Reviewed by [name], RPH-CPP (Pharmacist) on 02/26/21 at 1304  Med List Status: <None>   Medication Order Taking? Sig Documenting Provider Last Dose Status Informant  Continuous Blood Gluc Sensor (FREESTYLE LIBRE 2 SENSOR) MISC [id number]  Use to check glucose at least 4 times daily  Patient not taking: Reported on 01/15/2021   [name], MD  Active   empagliflozin (JARDIANCE) 25 MG TABS tablet [id number] Yes Take 1 tablet (25 mg total) by mouth daily. [name], MD Taking Active   gabapentin (NEURONTIN) 300 MG capsule [id number] No TAKE 1 CAPSULE BY MOUTH THREE TIMES A DAY  Patient not taking: No sig reported   [name], DPM Not Taking Active   lisinopril (ZESTRIL) 20 MG tablet [id number] Yes TAKE 1 TABLET BY MOUTH EVERY DAY [name], MD Taking Active  omeprazole (PRILOSEC) 20 MG capsule [id number] Yes Take 20 mg by mouth daily. [provider] Taking Active   rosuvastatin (CRESTOR) 40 MG tablet [id number] Yes TAKE 1 TABLET BY MOUTH EVERY DAY [name], MD Taking Active   Semaglutide, 2 MG/DOSE, (OZEMPIC, 2 MG/DOSE,) 8 MG/3ML SOPN [id number] No Inject 2 mg into the skin once a week.  Patient not taking: Reported on 02/26/2021   [name], MD Not Taking Active             Patient Active Problem List   Diagnosis Date Noted   Finger pain, right 04/20/2020   Tobacco abuse 03/18/2020   Hypertension associated with  diabetes (HCC) 03/18/2020   Mixed hyperlipidemia 03/18/2020   Nocturia 03/31/2019   Fatigue 03/31/2019   Neuropathy 08/27/2018   Left shoulder pain 09/05/2017   Cough 04/30/2015   Heme positive stool 03/07/2014   Shoulder pain, right 02/10/2014   Elevated liver function tests 10/27/2013   Alcohol abuse 10/27/2013   GERD (gastroesophageal reflux disease) 07/18/2013   Kidney stones 07/18/2013   Essential hypertension, benign 07/16/2013   Diabetes (HCC) 07/16/2013   Hypercholesterolemia 07/16/2013    Conditions to be addressed/monitored: HTN, HLD, and DMII  Care Plan : Medication Management  Updates made by [name], RPH-CPP since 02/26/2021 12:00 AM     Problem: Diabetes, HTN      Long-Range Goal: Disease Progression Prevention   This Visit's Progress: On track  Recent Progress: On track  Priority: High  Note:   Current Barriers:  Unable to achieve control of diabetes   Pharmacist Clinical Goal(s):  Over the next 90 days, patient will achieve control of diabetes as evidenced by A1c  through collaboration with PharmD and provider.   Interventions: 1:1 collaboration with [name], MD regarding development and update of comprehensive plan of care as evidenced by provider attestation and co-signature Inter-disciplinary care team collaboration (see longitudinal plan of care) Comprehensive medication review performed; medication list updated in electronic medical record  Health Maintenance; Due for eye exam,  foot exam. PCP visit next month. Previously discussed recommendation for COVID vaccination. Patient declined.   Diabetes: Uncontrolled but improved; current treatment: Jardiance 25 mg daily, Ozempic 2 mg weekly- reports pharmacy still hasn't filled Ozempic. PA had been approved.  Hx metformin XR - reported joint pain Denies any GI upset, nausea, vomiting, stomach upset with increased Ozempic dose Current glucose readings: post work 140-160 [facility], notified of [facility]. Script ran for $75/90 day. Provided patient with $0 copay card to use in combination with insurance. Patient will come by to pick up on Monday.  Recommended to continue current regimen at this time. PCP f/u scheduled next month  Hypertension: Controlled per last office visit; current treatment: lisinopril 20 mg daily  Previously recommended to continue current regimen at this time  Hyperlipidemia: Uncontrolled on last lab work though likely improved since subsequent dose increase; current treatment: rosuvastatin 40 mg daily;  Previously recommended to continue current regimen at this time. Due for updated lipid panel  Peripheral Neuropathy: Previously discussed worsening per patient report; current regimen: gabapentin 300 mg TID- reports he has not been taking because the pharmacy needs a new prescription  Advised to discuss with PCP at upcoming appointment.   GERD: Controlled per patient report. Current regimen: pantoprazole 20 mg daily Could consider trial/taper to H2RA in the future. Previously recommended to continue current regimen at this time  Patient Goals/Self-Care Activities Over the next 90 days, patient will:  -  take medications as prescribed check glucose twice weekly, document, and provide at future appointments  Follow Up Plan: Telephone follow up appointment with care management team member scheduled for: ~ 10 weeks     Medication Assistance:  None required.  Patient affirms current coverage meets needs.  Follow Up:  Patient agrees to Care Plan and Follow-up.  Plan: Telephone follow up appointment with care management team member scheduled for:  ~ 10 weeks  [name], PharmD, [place], CPP Clinical Pharmacist [employer] at [employer] [phone]

## 2021-03-25 ENCOUNTER — Other Ambulatory Visit: Payer: Self-pay

## 2021-03-25 ENCOUNTER — Ambulatory Visit: Payer: BC Managed Care – PPO | Admitting: Internal Medicine

## 2021-03-25 ENCOUNTER — Encounter: Payer: Self-pay | Admitting: Internal Medicine

## 2021-03-25 VITALS — BP 132/86 | HR 87 | Temp 95.8°F | Ht 67.01 in | Wt 187.6 lb

## 2021-03-25 DIAGNOSIS — G629 Polyneuropathy, unspecified: Secondary | ICD-10-CM

## 2021-03-25 DIAGNOSIS — E1159 Type 2 diabetes mellitus with other circulatory complications: Secondary | ICD-10-CM

## 2021-03-25 DIAGNOSIS — E7800 Pure hypercholesterolemia, unspecified: Secondary | ICD-10-CM

## 2021-03-25 DIAGNOSIS — Z125 Encounter for screening for malignant neoplasm of prostate: Secondary | ICD-10-CM

## 2021-03-25 DIAGNOSIS — I152 Hypertension secondary to endocrine disorders: Secondary | ICD-10-CM

## 2021-03-25 DIAGNOSIS — E538 Deficiency of other specified B group vitamins: Secondary | ICD-10-CM | POA: Diagnosis not present

## 2021-03-25 DIAGNOSIS — K219 Gastro-esophageal reflux disease without esophagitis: Secondary | ICD-10-CM

## 2021-03-25 DIAGNOSIS — E1165 Type 2 diabetes mellitus with hyperglycemia: Secondary | ICD-10-CM

## 2021-03-25 DIAGNOSIS — I10 Essential (primary) hypertension: Secondary | ICD-10-CM | POA: Diagnosis not present

## 2021-03-25 DIAGNOSIS — F1029 Alcohol dependence with unspecified alcohol-induced disorder: Secondary | ICD-10-CM

## 2021-03-25 DIAGNOSIS — R7989 Other specified abnormal findings of blood chemistry: Secondary | ICD-10-CM

## 2021-03-25 LAB — CBC WITH DIFFERENTIAL/PLATELET
Basophils Absolute: 0.1 10*3/uL (ref 0.0–0.1)
Basophils Relative: 0.9 % (ref 0.0–3.0)
Eosinophils Absolute: 0.2 10*3/uL (ref 0.0–0.7)
Eosinophils Relative: 3 % (ref 0.0–5.0)
HCT: 38.9 % — ABNORMAL LOW (ref 39.0–52.0)
Hemoglobin: 13.2 g/dL (ref 13.0–17.0)
Lymphocytes Relative: 20.8 % (ref 12.0–46.0)
Lymphs Abs: 1.3 10*3/uL (ref 0.7–4.0)
MCHC: 33.9 g/dL (ref 30.0–36.0)
MCV: 98.5 fl (ref 78.0–100.0)
Monocytes Absolute: 0.4 10*3/uL (ref 0.1–1.0)
Monocytes Relative: 6.8 % (ref 3.0–12.0)
Neutro Abs: 4.4 10*3/uL (ref 1.4–7.7)
Neutrophils Relative %: 68.5 % (ref 43.0–77.0)
Platelets: 178 10*3/uL (ref 150.0–400.0)
RBC: 3.95 Mil/uL — ABNORMAL LOW (ref 4.22–5.81)
RDW: 13.3 % (ref 11.5–15.5)
WBC: 6.4 10*3/uL (ref 4.0–10.5)

## 2021-03-25 LAB — PSA: PSA: 0.21 ng/mL (ref 0.10–4.00)

## 2021-03-25 LAB — HEMOGLOBIN A1C: Hgb A1c MFr Bld: 8.3 % — ABNORMAL HIGH (ref 4.6–6.5)

## 2021-03-25 LAB — MICROALBUMIN / CREATININE URINE RATIO
Creatinine,U: 51.7 mg/dL
Microalb Creat Ratio: 8 mg/g (ref 0.0–30.0)
Microalb, Ur: 4.1 mg/dL — ABNORMAL HIGH (ref 0.0–1.9)

## 2021-03-25 LAB — BASIC METABOLIC PANEL
BUN: 18 mg/dL (ref 6–23)
CO2: 26 mEq/L (ref 19–32)
Calcium: 9.6 mg/dL (ref 8.4–10.5)
Chloride: 103 mEq/L (ref 96–112)
Creatinine, Ser: 0.97 mg/dL (ref 0.40–1.50)
GFR: 87.17 mL/min (ref >60.00)
Glucose, Bld: 206 mg/dL — ABNORMAL HIGH (ref 70–99)
Potassium: 4.4 mEq/L (ref 3.5–5.1)
Sodium: 140 mEq/L (ref 135–145)

## 2021-03-25 LAB — LIPID PANEL
Cholesterol: 173 mg/dL (ref 0–200)
HDL: 53.2 mg/dL (ref >39.00)
NonHDL: 120.21
Total CHOL/HDL Ratio: 3
Triglycerides: 232 mg/dL — ABNORMAL HIGH (ref 0.0–149.0)
VLDL: 46.4 mg/dL — ABNORMAL HIGH (ref 0.0–40.0)

## 2021-03-25 LAB — HEPATIC FUNCTION PANEL
ALT: 67 U/L — ABNORMAL HIGH (ref 0–53)
AST: 41 U/L — ABNORMAL HIGH (ref 0–37)
Albumin: 4.7 g/dL (ref 3.5–5.2)
Alkaline Phosphatase: 59 U/L (ref 39–117)
Bilirubin, Direct: 0.1 mg/dL (ref 0.0–0.3)
Total Bilirubin: 0.6 mg/dL (ref 0.2–1.2)
Total Protein: 7.3 g/dL (ref 6.0–8.3)

## 2021-03-25 LAB — HM DIABETES FOOT EXAM

## 2021-03-25 LAB — LDL CHOLESTEROL, DIRECT: Direct LDL: 76 mg/dL

## 2021-03-25 LAB — TSH: TSH: 2.49 u[IU]/mL (ref 0.35–5.50)

## 2021-03-25 LAB — VITAMIN B12: Vitamin B-12: 353 pg/mL (ref 211–911)

## 2021-03-25 NOTE — Progress Notes (Signed)
[name]: Phillip Shepard, male   DOB: [birth year], 55 y.o.   MRN: [id number]   Subjective:    [name]: Phillip Shepard, male    DOB: [birth year], 55 y.o.   MRN: [id number]  HPI This visit occurred during the SARS-CoV-2 public health emergency.  Safety protocols were in place, including screening questions prior to the visit, additional usage of staff PPE, and extensive cleaning of exam room while observing appropriate contact time as indicated for disinfecting solutions.   Patient here for a scheduled follow up.  Here to follow-up regarding his blood pressure, cholesterol and diabetes.  He was stung by several wasps yesterday evening.  Took Benadryl.  No lip swelling or tongue swelling.  No shortness of breath.  Minimal soft tissue swelling around the base of his thumb but no other swelling.  No increased erythema.  He stays active.  No chest pain or tightness with increased activity or exertion.  No shortness of breath.  No nausea vomiting.  No abdominal pain or cramping.  No bowel change.  Back on Ozempic.  Has been on now for a month.  Tolerating.  States his morning sugars are averaging around 160.  Evening sugars 140-180.  Does not really watch his diet.    Past Medical History:  Diagnosis Date   Allergy    Diabetes mellitus without complication ([place])    Dysphagia    s/p esophageal dilatation   GERD (gastroesophageal reflux disease)    History of chickenpox    History of kidney stones    Hypercholesterolemia    Hypertension    Kidney stones 07/18/2013   Past Surgical History:  Procedure Laterality Date   ESOPHAGEAL DILATION     TONSILLECTOMY  1970   Family History  Problem Relation Age of Onset   Hyperlipidemia Mother    Hypertension Father    Arthritis Maternal Grandmother    Diabetes Maternal Grandmother    Lung cancer Maternal Grandfather    Heart disease Paternal Grandfather    Stroke Paternal Grandfather    Social History   Socioeconomic History   Marital  status: Married    Spouse name: Diane   Number of children: 2   Years of education: Not on file   Highest education level: Not on file  Occupational History    Employer: [employer]  Tobacco Use   Smoking status: Never   Smokeless tobacco: Former    Types: Chew   Tobacco comments:    [place]  Substance and Sexual Activity   Alcohol use: Yes    Alcohol/week: 0.0 standard drinks    Comment: drinks 3-4 beers per day   Drug use: No   Sexual activity: Yes  Other Topics Concern   Not on file  Social History Narrative   Not on file   Social Determinants of Health   Financial Resource Strain: Medium Risk   Difficulty of Paying Living Expenses: Somewhat hard  Food Insecurity: Not on file  Transportation Needs: Not on file  Physical Activity: Not on file  Stress: Not on file  Social Connections: Not on file    Review of Systems  Constitutional:  Negative for appetite change and unexpected weight change.  HENT:  Negative for congestion and sinus pressure.   Respiratory:  Negative for cough, chest tightness and shortness of breath.   Cardiovascular:  Negative for chest pain, palpitations and leg swelling.  Gastrointestinal:  Negative for abdominal pain, diarrhea, nausea and vomiting.  Genitourinary:  Negative for difficulty  urinating and dysuria.  Musculoskeletal:  Negative for joint swelling and myalgias.  Skin:  Negative for color change and rash.  Neurological:  Negative for dizziness, light-headedness and headaches.  Psychiatric/Behavioral:  Negative for agitation and dysphoric mood.       Objective:    Physical Exam Vitals reviewed.  Constitutional:      General: He is not in acute distress.    Appearance: Normal appearance. He is well-developed.  HENT:     Head: Normocephalic and atraumatic.     Right Ear: External ear normal.     Left Ear: External ear normal.  Eyes:     General: No scleral icterus.       Right eye: No discharge.        Left eye: No  discharge.     Conjunctiva/sclera: Conjunctivae normal.  Cardiovascular:     Rate and Rhythm: Normal rate and regular rhythm.  Pulmonary:     Effort: Pulmonary effort is normal. No respiratory distress.     Breath sounds: Normal breath sounds.  Abdominal:     General: Bowel sounds are normal.     Palpations: Abdomen is soft.     Tenderness: There is no abdominal tenderness.  Musculoskeletal:        General: No swelling or tenderness.     Cervical back: Neck supple. No tenderness.  Lymphadenopathy:     Cervical: No cervical adenopathy.  Skin:    Findings: No erythema or rash.  Neurological:     Mental Status: He is alert.  Psychiatric:        Mood and Affect: Mood normal.        Behavior: Behavior normal.    BP 132/86   Pulse 87   Temp (!) 95.8 F (35.4 C)   Ht 5' 7.01" (1.702 m)   Wt 187 lb 9.6 oz (85.1 kg)   SpO2 99%   BMI 29.38 kg/m  Wt Readings from Last 3 Encounters:  03/25/21 187 lb 9.6 oz (85.1 kg)  06/09/20 195 lb (88.5 kg)  04/14/20 196 lb (88.9 kg)    Outpatient Encounter Medications as of 03/25/2021  Medication Sig   empagliflozin (JARDIANCE) 25 MG TABS tablet Take 1 tablet (25 mg total) by mouth daily.   lisinopril (ZESTRIL) 20 MG tablet TAKE 1 TABLET BY MOUTH EVERY DAY   omeprazole (PRILOSEC) 20 MG capsule Take 20 mg by mouth daily.   rosuvastatin (CRESTOR) 40 MG tablet TAKE 1 TABLET BY MOUTH EVERY DAY   Semaglutide, 2 MG/DOSE, (OZEMPIC, 2 MG/DOSE,) 8 MG/3ML SOPN Inject 2 mg into the skin once a week.   [DISCONTINUED] Continuous Blood Gluc Sensor (FREESTYLE LIBRE 2 SENSOR) MISC Use to check glucose at least 4 times daily (Patient not taking: No sig reported)   [DISCONTINUED] gabapentin (NEURONTIN) 300 MG capsule TAKE 1 CAPSULE BY MOUTH THREE TIMES A DAY (Patient not taking: No sig reported)   No facility-administered encounter medications on file as of 03/25/2021.     Lab Results  Component Value Date   WBC 6.4 03/25/2021   HGB 13.2 03/25/2021   HCT  38.9 (L) 03/25/2021   PLT 178.0 03/25/2021   GLUCOSE 206 (H) 03/25/2021   CHOL 173 03/25/2021   TRIG 232.0 (H) 03/25/2021   HDL 53.20 03/25/2021   LDLDIRECT 76.0 03/25/2021   LDLCALC 129 (H) 02/03/2014   ALT 67 (H) 03/25/2021   AST 41 (H) 03/25/2021   NA 140 03/25/2021   K 4.4 03/25/2021   CL 103 03/25/2021  CREATININE 0.97 03/25/2021   BUN 18 03/25/2021   CO2 26 03/25/2021   TSH 2.49 03/25/2021   PSA 0.21 03/25/2021   HGBA1C 8.3 (H) 03/25/2021   MICROALBUR 4.1 (H) 03/25/2021       Assessment & Plan:   Problem List Items Addressed This Visit     Alcohol dependence ([place])    He had cut down his alcohol intake.  Follow.         B12 deficiency    Check B12 level today.        Relevant Orders   Vitamin B12 (Completed)   Diabetes (Early)    On ozempic and jardiance.  Sugars have been poorly controlled.  a1c elevated - 9.4.  On ozempic x 1 month now.  States was off for a while.  Discussed low carb diet and exercise.  Discussed importance of monitoring sugars.  Since just restarted ozempic, will hold on making changes.  Check met b and a1c.         Relevant Orders   Hemoglobin A1c (Completed)   Microalbumin / creatinine urine ratio (Completed)   Basic metabolic panel (Completed)   Elevated liver function tests    Have discussed diet and exercise.  Has cut down alcohol intake.  Recheck liver panel today.        Essential hypertension, benign    On lisinopril.  Spot check pressures.  Check metabolic panel.        Relevant Orders   CBC with Differential/Platelet (Completed)   GERD (gastroesophageal reflux disease)    No upper symptoms reported.  On prilosec.        Hypercholesterolemia    On crestor.  Low cholesterol diet and exercise.  Follow lipid panel and liver function tests.         Relevant Orders   Lipid panel (Completed)   TSH (Completed)   Hepatic function panel (Completed)   Hypertension associated with diabetes ([place]) - Primary    Blood  pressure as outlined.  On lisinopril.  Have him spot check pressure.  Hold on making changes.  Follow pressure.  Check metabolic panel.        Neuropathy    Check B12 level.         Other Visit Diagnoses     Prostate cancer screening       Relevant Orders   PSA (Completed)        Einar Pheasant, MD

## 2021-03-29 ENCOUNTER — Encounter: Payer: Self-pay | Admitting: Internal Medicine

## 2021-03-29 NOTE — Assessment & Plan Note (Signed)
Have discussed diet and exercise.  Has cut down alcohol intake.  Recheck liver panel today.

## 2021-03-29 NOTE — Assessment & Plan Note (Signed)
On crestor.  Low cholesterol diet and exercise.  Follow lipid panel and liver function tests.   

## 2021-03-29 NOTE — Assessment & Plan Note (Signed)
No upper symptoms reported. On prilosec.  

## 2021-03-29 NOTE — Assessment & Plan Note (Signed)
On ozempic and jardiance.  Sugars have been poorly controlled.  a1c elevated - 9.4.  On ozempic x 1 month now.  States was off for a while.  Discussed low carb diet and exercise.  Discussed importance of monitoring sugars.  Since just restarted ozempic, will hold on making changes.  Check met b and a1c.   

## 2021-03-29 NOTE — Assessment & Plan Note (Signed)
Check B12 level. 

## 2021-03-29 NOTE — Assessment & Plan Note (Signed)
Blood pressure as outlined.  On lisinopril.  Have him spot check pressure.  Hold on making changes.  Follow pressure.  Check metabolic panel.

## 2021-03-29 NOTE — Assessment & Plan Note (Signed)
Check B12 level today.  

## 2021-03-29 NOTE — Assessment & Plan Note (Signed)
On lisinopril.  Spot check pressures.  Check metabolic panel.

## 2021-03-29 NOTE — Assessment & Plan Note (Signed)
He had cut down his alcohol intake.  Follow.

## 2021-05-06 LAB — HM DIABETES EYE EXAM

## 2021-05-14 ENCOUNTER — Ambulatory Visit: Payer: BC Managed Care – PPO | Admitting: Pharmacist

## 2021-05-14 DIAGNOSIS — I10 Essential (primary) hypertension: Secondary | ICD-10-CM

## 2021-05-14 DIAGNOSIS — E7800 Pure hypercholesterolemia, unspecified: Secondary | ICD-10-CM

## 2021-05-14 DIAGNOSIS — E1165 Type 2 diabetes mellitus with hyperglycemia: Secondary | ICD-10-CM

## 2021-05-14 NOTE — Patient Instructions (Signed)
Visit Information   Goals Addressed               This Visit's Progress     Patient Stated     Medication Monitoring (pt-stated)        Patient Goals/Self-Care Activities Over the next 90 days, patient will:  - take medications as prescribed check glucose at twice daily, document, and provide at future         Patient verbalizes understanding of instructions provided today and agrees to view in MyChart.    Plan: Telephone follow up appointment with care management team member scheduled for:  ~ 12 weeks  [name], PharmD, [place], CPP Clinical Pharmacist [facility] [phone]

## 2021-05-14 NOTE — Chronic Care Management (AMB) (Signed)
Care Management   Pharmacy Note  05/14/2021 Name: Phillip Shepard MRN: [id number] DOB: 01-13-1966  Subjective: Phillip Shepard is a 55 y.o. year old male who is a primary care patient of [name] [place], MD. The Care Management team was consulted for assistance with care management and care coordination needs.    Engaged with patient by telephone for follow up visit in response to provider referral for pharmacy case management and/or care coordination services.   The patient was given information about Care Management services today including:  Care Management services includes personalized support from designated clinical staff supervised by the patient's primary care provider, including individualized plan of care and coordination with other care providers. 24/7 contact phone numbers for assistance for urgent and routine care needs. The patient may stop case management services at any time by phone call to the office staff.  Patient agreed to services and consent obtained.  Assessment:  Review of patient status, including review of consultants reports, laboratory and other test data, was performed as part of comprehensive evaluation and provision of chronic care management services.   SDOH (Social Determinants of Health) assessments and interventions performed:  SDOH Interventions    Flowsheet Row Most Recent Value  SDOH Interventions   Financial Strain Interventions Intervention Not Indicated        Objective:  Lab Results  Component Value Date   CREATININE 0.97 03/25/2021   CREATININE 1.03 03/18/2020   CREATININE 0.92 03/28/2019    Lab Results  Component Value Date   HGBA1C 8.3 (H) 03/25/2021       Component Value Date/Time   CHOL 173 03/25/2021 0841   TRIG 232.0 (H) 03/25/2021 0841   HDL 53.20 03/25/2021 0841   CHOLHDL 3 03/25/2021 0841   VLDL 46.4 (H) 03/25/2021 0841   LDLCALC 129 (H) 02/03/2014 1103   LDLDIRECT 76.0 03/25/2021 0841      BP Readings  from Last 3 Encounters:  03/25/21 132/86  06/09/20 132/76  04/14/20 138/70    Care Plan  No Known Allergies  Medications Reviewed Today     Reviewed by [name], RPH-CPP (Pharmacist) on 05/14/21 at 1304  Med List Status: <None>   Medication Order Taking? Sig Documenting Provider Last Dose Status Informant  empagliflozin (JARDIANCE) 25 MG TABS tablet [id number] Yes Take 1 tablet (25 mg total) by mouth daily. [name] [place], MD Taking Active   lisinopril (ZESTRIL) 20 MG tablet [id number] Yes TAKE 1 TABLET BY MOUTH EVERY DAY [name] [place], MD Taking Active   omeprazole (PRILOSEC) 20 MG capsule [id number] Yes Take 20 mg by mouth daily. [provider] Taking Active   rosuvastatin (CRESTOR) 40 MG tablet [id number] Yes TAKE 1 TABLET BY MOUTH EVERY DAY [name] [place], MD Taking Active   Semaglutide, 2 MG/DOSE, (OZEMPIC, 2 MG/DOSE,) 8 MG/3ML SOPN [id number] Yes Inject 2 mg into the skin once a week. [name] [place], MD Taking Active             Patient Active Problem List   Diagnosis Date Noted   B12 deficiency 03/25/2021   Finger pain, right 04/20/2020   Tobacco abuse 03/18/2020   Hypertension associated with diabetes (HCC) 03/18/2020   Mixed hyperlipidemia 03/18/2020   Nocturia 03/31/2019   Fatigue 03/31/2019   Neuropathy 08/27/2018   Left shoulder pain 09/05/2017   Cough 04/30/2015   Heme positive stool 03/07/2014   Shoulder pain, right 02/10/2014   Elevated liver function tests 10/27/2013   Alcohol dependence ([facility]CC) 10/27/2013   GERD (gastroesophageal reflux  disease) 07/18/2013   Kidney stones 07/18/2013   Essential hypertension, benign 07/16/2013   Diabetes (HCC) 07/16/2013   Hypercholesterolemia 07/16/2013    Conditions to be addressed/monitored: HTN, HLD, and DMII  Care Plan : Medication Management  Updates made by [name], RPH-CPP since 05/14/2021 12:00 AM     Problem: Diabetes, HTN      Long-Range Goal: Disease Progression  Prevention   This Visit's Progress: On track  Recent Progress: On track  Priority: High  Note:   Current Barriers:  Unable to achieve control of diabetes   Pharmacist Clinical Goal(s):  Over the next 90 days, patient will achieve control of diabetes as evidenced by A1c  through collaboration with PharmD and provider.   Interventions: 1:1 collaboration with [name] [place], MD regarding development and update of comprehensive plan of care as evidenced by provider attestation and co-signature Inter-disciplinary care team collaboration (see longitudinal plan of care) Comprehensive medication review performed; medication list updated in electronic medical record  Health Maintenance Yearly diabetic eye exam: up to date Yearly diabetic foot exam: up to date Urine microalbumin: up to date Yearly influenza vaccination: due - recommend yearly influenza vaccine Td/Tdap vaccination: due  - patient declined, will discuss moving forward Pneumonia vaccination: up to date COVID vaccinations: due - patient declined, will discuss moving forward Shingrix vaccinations: due - patient declined, will discuss moving forward Colonoscopy: due - patient declined, will discuss moving forward  Diabetes: Uncontrolled but improved; current treatment: Jardiance 25 mg daily, Ozempic 2 mg weekly Hx metformin XR - reported joint pain Denies any GI upset, nausea, vomiting, stomach upset with Ozempic Current glucose readings: post work, around 4 pm: 150-200s Current meal patterns: breakfast: pack of nabs, sometimes skips; lunch: bologna sandwich on honey wheat bread, supper: chicken, leftovers, steak; green beans, mashed potatoes, eats a lot of grilled chicken salads; drinks: water, unsweet tea, very infrequent zero sugar soft drinks; 3 beers daily during the week, more during the weekends; grapes Extensive dietary discussion. Discussed principals of portion sizes, Plate Method. Discussed reduction in alcohol.   Reviewed goal A1c, goal fasting, goal 2 hour post prandial. Encouraged to check blood sugars BID, document, and provide at future appointments Recommended to continue current regimen at this time. Moving forward, if next A1c not at goal and dietary/lifestyle interventions are not successful and next A1c not at goal, recommend consideration for use of Mounjaro instead of Ozempic vs, addition of basal insulin if fasting + post prandial elevations or sulfonylurea if just post prandial elevations,   Hypertension: Controlled per last office visit; current treatment: lisinopril 20 mg daily  Previously recommended to continue current regimen at this time  Hyperlipidemia: Improved; near goal of LDL <70; current treatment: rosuvastatin 40 mg daily Previously recommended to continue current regimen at this time.  Peripheral Neuropathy: Previously discussed worsening per patient report; current regimen: gabapentin 300 mg daily Recommended to continue current regimen at this time  GERD: Controlled per patient report. Current regimen: pantoprazole 20 mg daily Could consider trial/taper to H2RA in the future. Previously recommended to continue current regimen at this time  Patient Goals/Self-Care Activities Over the next 90 days, patient will:  - take medications as prescribed check glucose twice weekly, document, and provide at future appointments  Follow Up Plan: Telephone follow up appointment with care management team member scheduled for: ~ 12 weeks (PCP in 6 weeks)     Medication Assistance:  None required.  Patient affirms current coverage meets needs.  Follow Up:  Patient agrees to Care Plan and Follow-up.  Plan: Telephone follow up appointment with care management team member scheduled for:  ~ 12 weeks  [name], PharmD, [place], CPP Clinical Pharmacist [facility] [employer] [facility] [phone]

## 2021-05-20 ENCOUNTER — Encounter: Payer: Self-pay | Admitting: Internal Medicine

## 2021-05-21 ENCOUNTER — Telehealth: Payer: Self-pay | Admitting: Internal Medicine

## 2021-05-21 DIAGNOSIS — E1165 Type 2 diabetes mellitus with hyperglycemia: Secondary | ICD-10-CM

## 2021-05-21 MED ORDER — OZEMPIC (1 MG/DOSE) 4 MG/3ML ~~LOC~~ SOPN: 2.0000 mg | PEN_INJECTOR | SUBCUTANEOUS | 1 refills | Status: DC

## 2021-05-21 NOTE — Telephone Encounter (Signed)
Script sent to pharmacy.  Patient notified.

## 2021-05-21 NOTE — Telephone Encounter (Signed)
Patient states the 20mL Ozempic is on back order. Wanting to know if he can two of the 1 ml sent in? States this is in stock.   States he is out and needs this by Sunday for his next shot.

## 2021-06-29 ENCOUNTER — Telehealth: Payer: Self-pay | Admitting: Internal Medicine

## 2021-06-29 NOTE — Telephone Encounter (Signed)
Pt called in requesting to speak with [name] regarding the medication Ozempic. Pt states he has been unable to find the medication anywhere.  [phone]

## 2021-06-30 ENCOUNTER — Other Ambulatory Visit: Payer: Self-pay

## 2021-06-30 MED ORDER — OZEMPIC (2 MG/DOSE) 8 MG/3ML ~~LOC~~ SOPN
PEN_INJECTOR | SUBCUTANEOUS | 1 refills | Status: DC
  Filled 2021-06-30: qty 3, 28d supply, fill #0

## 2021-06-30 NOTE — Telephone Encounter (Addendum)
Patient reports has been unable to obtain Ozempic 2 mg as unable to find in stock at [facility].  He was unable to pick up Rx for Ozempic 1 mg (direction inject 2 mg into the skin once a week) as reports insurance would not cover with this direction Today find [facility] has Ozempic 2 mg in stock Request [facility] transfer Ozempic 2 mg Rx from [facility] and fill for patient   Estelle Grumbles, PharmD, [employer] Clinical Pharmacist [facility] [phone]

## 2021-07-05 ENCOUNTER — Encounter: Payer: BC Managed Care – PPO | Admitting: Internal Medicine

## 2021-07-30 ENCOUNTER — Other Ambulatory Visit: Payer: Self-pay

## 2021-07-30 ENCOUNTER — Telehealth: Payer: Self-pay | Admitting: Internal Medicine

## 2021-07-30 DIAGNOSIS — E1165 Type 2 diabetes mellitus with hyperglycemia: Secondary | ICD-10-CM

## 2021-07-30 MED ORDER — OZEMPIC (2 MG/DOSE) 8 MG/3ML ~~LOC~~ SOPN
PEN_INJECTOR | SUBCUTANEOUS | 1 refills | Status: DC
  Filled 2021-07-30: qty 3, 28d supply, fill #0
  Filled 2021-08-27: qty 3, 84d supply, fill #1
  Filled 2021-09-09: qty 3, 28d supply, fill #1
  Filled 2021-10-08: qty 3, 28d supply, fill #2
  Filled 2021-11-04: qty 3, 28d supply, fill #3
  Filled 2021-12-03: qty 3, 28d supply, fill #4
  Filled 2022-01-04: qty 3, 28d supply, fill #5

## 2021-07-30 NOTE — Telephone Encounter (Signed)
Patient  called in need refill  sent for  Ozempic  2mg   Please call patient back

## 2021-07-30 NOTE — Telephone Encounter (Signed)
Called patient. Sent script to [facility]

## 2021-08-10 ENCOUNTER — Ambulatory Visit: Payer: BC Managed Care – PPO | Admitting: Pharmacist

## 2021-08-10 DIAGNOSIS — E7800 Pure hypercholesterolemia, unspecified: Secondary | ICD-10-CM

## 2021-08-10 DIAGNOSIS — E1165 Type 2 diabetes mellitus with hyperglycemia: Secondary | ICD-10-CM

## 2021-08-10 DIAGNOSIS — I10 Essential (primary) hypertension: Secondary | ICD-10-CM

## 2021-08-10 NOTE — Chronic Care Management (AMB) (Signed)
Chronic Care Management CCM Pharmacy Note  08/10/2021 Name:  Phillip Shepard MRN:  [id number] DOB:  June 04, 1966  Summary: - Reports dietary indiscretions recently. Glucose uncontrolled. Requests time to get back into a "normal routine"  Recommendations/Changes made from today's visit: - Continue current regimen at this time. Follow up with PCP in March as scheduled. Fasting labs ordered and scheduled.  - Recommend addition of basal insulin if next A1c uncontrolled  Subjective: Phillip Shepard is an 55 y.o. year old male who is a primary patient of [name] [place], MD.  The CCM team was consulted for assistance with disease management and care coordination needs.    Engaged with patient by telephone for follow up visit for pharmacy case management and/or care coordination services.   Objective:  Medications Reviewed Today     Reviewed by [name], RPH-CPP (Pharmacist) on 08/10/21 at 1126  Med List Status: <None>   Medication Order Taking? Sig Documenting Provider Last Dose Status Informant  empagliflozin (JARDIANCE) 25 MG TABS tablet [id number] Yes Take 1 tablet (25 mg total) by mouth daily. [name] [place], MD Taking Active   lisinopril (ZESTRIL) 20 MG tablet [id number] Yes TAKE 1 TABLET BY MOUTH EVERY DAY [name] [place], MD Taking Active   omeprazole (PRILOSEC) 20 MG capsule [id number] Yes Take 20 mg by mouth daily. [provider] Taking Active   rosuvastatin (CRESTOR) 40 MG tablet [id number] Yes TAKE 1 TABLET BY MOUTH EVERY DAY [name] [employer], MD Taking Active   Semaglutide, 2 MG/DOSE, (OZEMPIC, 2 MG/DOSE,) 8 MG/3ML SOPN [id number] Yes Inject 2mg  into the skin once a week , MD Taking Active             Pertinent Labs:   Lab Results  Component Value Date   HGBA1C 8.3 (H) 03/25/2021   Lab Results  Component Value Date   CHOL 173 03/25/2021   HDL 53.20 03/25/2021   LDLCALC 129 (H) 02/03/2014   LDLDIRECT 76.0 03/25/2021   TRIG 232.0  (H) 03/25/2021   CHOLHDL 3 03/25/2021   Lab Results  Component Value Date   CREATININE 0.97 03/25/2021   BUN 18 03/25/2021   NA 140 03/25/2021   K 4.4 03/25/2021   CL 103 03/25/2021   CO2 26 03/25/2021    SDOH:  (Social Determinants of Health) assessments and interventions performed:  SDOH Interventions    Flowsheet Row Most Recent Value  SDOH Interventions   Financial Strain Interventions Intervention Not Indicated       CCM Care Plan  Review of patient past medical history, allergies, medications, health status, including review of consultants reports, laboratory and other test data, was performed as part of comprehensive evaluation and provision of chronic care management services.   Care Plan : Medication Management  Updates made by 05/25/2021, RPH-CPP since 08/10/2021 12:00 AM     Problem: Diabetes, HTN      Long-Range Goal: Disease Progression Prevention   Recent Progress: On track  Priority: High  Note:   Current Barriers:  Unable to achieve control of diabetes   Pharmacist Clinical Goal(s):  Over the next 90 days, patient will achieve control of diabetes as evidenced by A1c  through collaboration with PharmD and provider.   Interventions: 1:1 collaboration with 08/12/2021, MD regarding development and update of comprehensive plan of care as evidenced by provider attestation and co-signature Inter-disciplinary care team collaboration (see longitudinal plan of care) Comprehensive medication review performed; medication list updated in electronic medical record  Health  Maintenance Yearly diabetic eye exam: up to date Yearly diabetic foot exam: up to date Urine microalbumin: up to date Yearly influenza vaccination: due - recommend yearly influenza vaccine Td/Tdap vaccination: due  - patient declined, will discuss moving forward Pneumonia vaccination: up to date COVID vaccinations: due - patient declined, will discuss moving forward Shingrix  vaccinations: due - patient declined, will discuss moving forward Colonoscopy: due - patient declined, will discuss moving forward  Diabetes: Uncontrolled but improved; current treatment: Jardiance 25 mg daily, Ozempic 2 mg weekly Hx metformin XR - reported joint pain Current glucose readings: post work, around 4 pm: 180s; reports fastings are still 200s. Declines to retry CGM Current meal patterns: reports lots of dietary indiscretions during the holidays. Requests time to get back into a regular routine in the new year.  Encouraged to continue to focus on moderation with diet, increase in physical activity.  Scheduled PCP f/u and lab work in March.  Recommended to continue current regimen at this time. Recommend addition of basal insulin if next A1c not at goal.   Hypertension: Controlled per last office visit; current treatment: lisinopril 20 mg daily  Previously recommended to continue current regimen at this time  Hyperlipidemia: Improved; near goal of LDL <70; current treatment: rosuvastatin 40 mg daily Previously recommended to continue current regimen at this time.  Peripheral Neuropathy: Previously discussed worsening per patient report; current regimen: gabapentin 300 mg daily Recommended to continue current regimen at this time  GERD: Controlled per patient report. Current regimen: pantoprazole 20 mg daily Could consider trial/taper to H2RA in the future. Previously recommended to continue current regimen at this time  Patient Goals/Self-Care Activities Over the next 90 days, patient will:  - take medications as prescribed check glucose twice weekly, document, and provide at future appointments      Plan: Telephone follow up appointment with care management team member scheduled for:  5 months (PCP in ~ 3 months)  [name], PharmD, [place], CPP Clinical Pharmacist [facility] at [facility] [phone]

## 2021-08-10 NOTE — Patient Instructions (Signed)
Visit Information  Following are the goals we discussed today:    Plan: Telephone follow up appointment with care management team member scheduled for:  5 months (PCP in ~ 3 months)   [name], PharmD, [place], CPP Clinical Pharmacist [facility] at [facility] [phone]  Please call the care guide team at [phone] if you need to cancel or reschedule your appointment.   Patient verbalizes understanding of instructions provided today and agrees to view in MyChart.

## 2021-08-16 ENCOUNTER — Other Ambulatory Visit: Payer: Self-pay | Admitting: Internal Medicine

## 2021-08-27 ENCOUNTER — Other Ambulatory Visit: Payer: Self-pay

## 2021-09-09 ENCOUNTER — Telehealth: Payer: Self-pay | Admitting: Pharmacist

## 2021-09-09 ENCOUNTER — Other Ambulatory Visit: Payer: Self-pay

## 2021-09-09 DIAGNOSIS — E7800 Pure hypercholesterolemia, unspecified: Secondary | ICD-10-CM

## 2021-09-09 MED ORDER — ROSUVASTATIN CALCIUM 40 MG PO TABS
40.0000 mg | ORAL_TABLET | Freq: Every day | ORAL | 1 refills | Status: DC
  Filled 2021-09-09: qty 90, 90d supply, fill #0

## 2021-09-09 MED ORDER — ROSUVASTATIN CALCIUM 40 MG PO TABS: 40.0000 mg | ORAL_TABLET | Freq: Every day | ORAL | 1 refills | Status: DC

## 2021-09-09 NOTE — Telephone Encounter (Signed)
Received call from patient. Notes he has not received a refill on his Ozempic and has been out. Also requests a refill on rosuvastatin.   [facility] pharmacy. Appears per fill history they filled 3 mL for a 84 day supply (1 box) when it should be 9 mL for a 84 day supply. They will rebill for him and fill rosuvastatin.

## 2021-09-22 ENCOUNTER — Encounter: Payer: Self-pay | Admitting: Adult Health

## 2021-09-22 ENCOUNTER — Ambulatory Visit: Payer: BC Managed Care – PPO | Admitting: Adult Health

## 2021-09-22 ENCOUNTER — Other Ambulatory Visit: Payer: Self-pay

## 2021-09-22 VITALS — BP 150/86 | HR 92 | Temp 98.0°F | Resp 14 | Ht 67.01 in | Wt 195.0 lb

## 2021-09-22 DIAGNOSIS — H109 Unspecified conjunctivitis: Secondary | ICD-10-CM | POA: Diagnosis not present

## 2021-09-22 MED ORDER — POLYMYXIN B-TRIMETHOPRIM 10000-0.1 UNIT/ML-% OP SOLN: 1.0000 [drp] | OPHTHALMIC | 0 refills | Status: AC

## 2021-09-22 NOTE — Patient Instructions (Addendum)
Bacterial Conjunctivitis, Adult Bacterial conjunctivitis is an infection of your conjunctiva. This is the clear membrane that covers the white part of your eye and the inner part of your eyelid. This infection can make your eye: Red or pink. Itchy or irritated. This condition spreads easily from person to person (is contagious) and from one eye to the other eye. What are the causes? This condition is caused by germs (bacteria). You may get the infection if you come into close contact with: A person who has the infection. Items that have germs on them (are contaminated), such as face towels, contact lens solution, or eye makeup. What increases the risk? You are more likely to get this condition if: You have contact with people who have the infection. You wear contact lenses. You have a sinus infection. You have had a recent eye injury or surgery. You have a weak body defense system (immune system). You have dry eyes. What are the signs or symptoms?  Thick, yellowish discharge from the eye. Tearing or watery eyes. Itchy eyes. Burning feeling in your eyes. Eye redness. Swollen eyelids. Blurred vision. How is this treated?  Antibiotic eye drops or ointment. Antibiotic medicine taken by mouth. This is used for infections that do not get better with drops or ointment or that last more than 10 days. Cool, wet cloths placed on the eyes. Artificial tears used 2-6 times a day. Follow these instructions at home: Medicines Take or apply your antibiotic medicine as told by your doctor. Do not stop using it even if you start to feel better. Take or apply over-the-counter and prescription medicines only as told by your doctor. Do not touch your eyelid with the eye-drop bottle or the ointment tube. Managing discomfort Wipe any fluid from your eye with a warm, wet washcloth or a cotton ball. Place a clean, cool, wet cloth on your eye. Do this for 10-20 minutes, 3-4 times a day. General  instructions Do not wear contacts until the infection is gone. Wear glasses until your doctor says it is okay to wear contacts again. Do not wear eye makeup until the infection is gone. Throw away old eye makeup. Change or wash your pillowcase every day. Do not share towels or washcloths. Wash your hands often with soap and water for at least 20 seconds and especially before touching your face or eyes. Use paper towels to dry your hands. Do not touch or rub your eyes. Do not drive or use heavy machinery if your vision is blurred. Contact a doctor if: You have a fever. You do not get better after 10 days. Get help right away if: You have a fever and your symptoms get worse all of a sudden. You have very bad pain when you move your eye. Your face: Hurts. Is red. Is swollen. You have sudden loss of vision. Summary Bacterial conjunctivitis is an infection of your conjunctiva. This infection spreads easily from person to person. Wash your hands often with soap and water for at least 20 seconds and especially before touching your face or eyes. Use paper towels to dry your hands. Take or apply your antibiotic medicine as told by your doctor. Contact a doctor if you have a fever or you do not get better after 10 days. This information is not intended to replace advice given to you by your health care provider. Make sure you discuss any questions you have with your health care provider. Document Revised: 11/18/2020 Document Reviewed: 11/18/2020 Elsevier Patient Education  [place]. Adenovirus Infection, Adult Adenoviruses are common viruses that cause many types of infections. These viruses may affect the nose, throat, windpipe, and lungs (respiratory system), as well as other parts of the body, including the eyes, stomach, bowels, bladder, and brain. The most common type of adenovirus infection is the common cold. Usually, adenovirus infections are not severe. However, they can become  severe in people who have another health problem that makes it hard to fight off infection. What are the causes? This condition is caused by an adenovirus entering your body. Some ways this can happen are: Touching a surface or object that has an adenovirus on it and then touching your mouth, nose, or eyes with unwashed hands. Coming in close physical contact with someone who has this type of infection. This may happen if you hug or shake hands with the person. Breathing in droplets that fly through the air when someone who has the infection talks, coughs, or sneezes. Having contact with stool (feces) that has the virus in it. Using a swimming pool that does not have enough chlorine in it. Chlorine is a chemical that kills germs. Adenoviruses can live outside the body for a long time. They spread easily from person to person (are contagious). What increases the risk? The following factors may make you more likely to develop this condition: Spending a lot of time in places where there are many people. These include schools, summer camps, day care centers, community centers, and training centers for people who join the [employer]. Being an older adult. Having a weak immune system. This is the body's defense system. Having a disease of the respiratory system. Having a heart condition. What are the signs or symptoms? Adenovirus infections usually cause flu-like symptoms. When the virus enters the body, symptoms of this condition can take up to 14 days to develop. Symptoms may include: Having lung and breathing problems, such as: Cough. Trouble breathing. Runny nose or stuffy (congested) nose. Feeling aches and pains, including: Headache. Stiff neck. Sore throat. Ear pain or congested ears. Stomachache. Having digestive problems, such as: Feeling nauseous or vomiting. Having diarrhea. Having a fever. Having eye problems, such as pink eye (conjunctivitis), causing inflammation and  redness. Rash. Less common symptoms include: Being confused or not knowing the time of day or where you are (disoriented). Having blood in your urine or having pain while urinating. How is this diagnosed? This condition may be diagnosed based on your symptoms and a physical exam. Your health care provider may order tests to make sure your symptoms are not caused by another problem. Tests can include: Blood tests. Urine tests. Stool tests. Chest X-ray. Tests of tissue or mucus from your throat. How is this treated? This condition goes away on its own with time. Treatment for this condition involves managing symptoms until they go away. Your health care provider may recommend: Getting plenty of rest. Drinking more fluids than usual. Taking over-the-counter medicine to help relieve a sore throat, fever, or headache. Follow these instructions at home: Lifestyle Do not drink alcohol. Do not use any products that contain nicotine or tobacco, such as cigarettes, e-cigarettes, and chewing tobacco. If you need help quitting, ask your health care provider. General instructions Take over-the-counter and prescription medicines only as told by your health care provider. Rest at home until your symptoms go away. Drink enough fluid to keep your urine pale yellow. If you have a sore throat, gargle with a salt-water mixture 3-4 times a day or  as needed. To make a salt-water mixture, completely dissolve -1 tsp (3-6 g) of salt in 1 cup (237 mL) of warm water. Keep all follow-up visits as told by your health care provider. This is important. Return to your normal activities as told by your health care provider. Ask your health care provider what activities are safe for you. How is this prevented?   Adenoviruses often are not killed by cleaning products and can remain on surfaces for a long time. To help prevent becoming infected or spreading infection: Wash your hands often with soap and water. If soap  and water are not available, use hand sanitizer. Cover your mouth when you cough. Cover your nose and mouth when you sneeze. Do not touch your eyes, nose, or mouth with unwashed hands, and wash hands after touching these areas. Clean commonly used objects often. Do not use a swimming pool that does not have enough chlorine in it. Avoid close contact with people who are sick. Do not go to school or work when you are sick. Do not share cups or eating utensils. Where to find more information Centers for Disease Control and Prevention: http://www.wolf.info/ Contact a health care provider if: Your symptoms stay the same after 10 days. Your symptoms get worse. You cannot eat or drink without vomiting. Get help right away if: You have trouble breathing or you are breathing quickly. Your skin, lips, or fingernails look blue. Your heart is beating fast. You become confused. You lose consciousness. These symptoms may represent a serious problem that is an emergency. Do not wait to see if the symptoms will go away. Get medical help right away. Call your local emergency services (911 in the [place] Do not drive yourself to the hospital. Summary The most common type of adenovirus infection is the common cold. This condition goes away on its own with time. Adenoviruses can live outside the body for a long time. They spread easily from person to person (are contagious). Rest at home until your symptoms go away. Contact a health care provider if your symptoms stay the same after 10 days. This information is not intended to replace advice given to you by your health care provider. Make sure you discuss any questions you have with your health care provider. Document Revised: 02/27/2019 Document Reviewed: 02/27/2019 Elsevier Patient Education  [place].

## 2021-09-22 NOTE — Progress Notes (Addendum)
Acute Office Visit  Subjective:    [name]: Phillip Shepard, male    DOB: [birth year], 56 y.o.   MRN: [id number]  Chief Complaint  Patient presents with   Acute Visit    R swollen eye onset Sunday night, feels scratchy, Denies any pain/pressure,or double vision. Has seen improvement since Sunday.     HPI Patient is in today for right eye swelling, and gritty feeling, drainage yellow drainage.  Has to pry his eye open last two mornings.  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 He sees them again in 1 week last was 5 weeks ago. Doing well with this.  Patient  denies any fever, body aches,chills, rash, chest pain, shortness of breath, nausea, vomiting, or diarrhea.  Denies dizziness, lightheadedness, pre syncopal or syncopal episodes.    Past Medical History:  Diagnosis Date   Allergy    Diabetes mellitus without complication ([place])    Dysphagia    s/p esophageal dilatation   GERD (gastroesophageal reflux disease)    History of chickenpox    History of kidney stones    Hypercholesterolemia    Hypertension    Kidney stones 07/18/2013    Past Surgical History:  Procedure Laterality Date   ESOPHAGEAL DILATION     TONSILLECTOMY  1970    Family History  Problem Relation Age of Onset   Hyperlipidemia Mother    Hypertension Father    Arthritis Maternal Grandmother    Diabetes Maternal Grandmother    Lung cancer Maternal Grandfather    Heart disease Paternal Grandfather    Stroke Paternal Grandfather     Social History   Socioeconomic History   Marital status: Married    Spouse name: Diane   Number of children: 2   Years of education: Not on file   Highest education level: Not on file  Occupational History    Employer:  [employer]  Tobacco Use   Smoking status: Never   Smokeless tobacco: Former    Types: Chew   Tobacco  comments:    [place]  Substance and Sexual Activity   Alcohol use: Yes    Alcohol/week: 0.0 standard drinks    Comment: drinks 3-4 beers per day   Drug use: No   Sexual activity: Yes  Other Topics Concern   Not on file  Social History Narrative   Not on file   Social Determinants of Health   Financial Resource Strain: Low Risk    Difficulty of Paying Living Expenses: Not hard at all  Food Insecurity: Not on file  Transportation Needs: Not on file  Physical Activity: Not on file  Stress: Not on file  Social Connections: Not on file  Intimate Partner Violence: Not on file    Outpatient Medications Prior to Visit  Medication Sig Dispense Refill   empagliflozin (JARDIANCE) 25 MG TABS tablet Take 1 tablet (25 mg total) by mouth daily. 90 tablet 3   lisinopril (ZESTRIL) 20 MG tablet TAKE 1 TABLET BY MOUTH EVERY DAY 90 tablet 3   omeprazole (PRILOSEC) 20 MG capsule Take 20 mg by mouth daily.     rosuvastatin (CRESTOR) 40 MG tablet Take 1 tablet (40 mg total) by mouth daily. 90 tablet 1   Semaglutide, 2 MG/DOSE, (OZEMPIC, 2 MG/DOSE,) 8 MG/3ML SOPN Inject 2mg  into the skin once a week 9 mL  1   No facility-administered medications prior to visit.    No Known Allergies  Review of Systems  Constitutional: Negative.   HENT: Negative.    Eyes:  Positive for discharge and redness. Negative for photophobia, pain, itching and visual disturbance.  Respiratory: Negative.    Cardiovascular: Negative.   Gastrointestinal: Negative.   Genitourinary: Negative.   Musculoskeletal: Negative.   Skin: Negative.   Hematological: Negative.   Psychiatric/Behavioral: Negative.        Objective:    Physical Exam HENT:     Right Ear: Hearing, tympanic membrane, ear canal and external ear normal.     Left Ear: Hearing, tympanic membrane, ear canal and external ear normal.     Nose: Nose normal.     Mouth/Throat:     Lips: Pink.     Mouth: Mucous membranes are moist.  Eyes:     General:  Lids are normal. Lids are everted, no foreign bodies appreciated.     Extraocular Movements:     Right eye: Normal extraocular motion and no nystagmus.     Left eye: Normal extraocular motion and no nystagmus.     Conjunctiva/sclera:     Right eye: Right conjunctiva is injected. Exudate present. No chemosis or hemorrhage.    Left eye: Left conjunctiva is not injected. No chemosis, exudate or hemorrhage.    Pupils: Pupils are equal, round, and reactive to light. Pupils are equal.     Right eye: Pupil is not sluggish.     Left eye: Pupil is not sluggish.     Funduscopic exam:    Right eye: No hemorrhage or AV nicking. Red reflex present.        Left eye: No hemorrhage or AV nicking. Red reflex present.   BP (!) 150/86 (BP Location: Left Arm, Patient Position: Sitting, Cuff Size: Large)    Pulse 92    Temp 98 F (36.7 C) (Oral)    Resp 14    Ht 5' 7.01" (1.702 m)    Wt 195 lb (88.5 kg)    SpO2 98%    BMI 30.53 kg/m  Wt Readings from Last 3 Encounters:  09/22/21 195 lb (88.5 kg)  03/25/21 187 lb 9.6 oz (85.1 kg)  06/09/20 195 lb (88.5 kg)    General: Appearance:    Mildly obese male in no acute distress  Eyes:    PERRL, conjunctiva/corneas clear, EOM's intact       Lungs:     Clear to auscultation bilaterally, respirations unlabored  Heart:    Normal heart rate. Normal rhythm. No murmurs, rubs, or gallops.    MS:   All extremities are intact.    Neurologic:   Awake, alert, oriented x 3. No apparent focal neurological           defect.     Health Maintenance Due  Topic Date Due   HIV Screening  Never done   HEMOGLOBIN A1C  09/25/2021    There are no preventive care reminders to display for this patient.   Lab Results  Component Value Date   TSH 2.49 03/25/2021   Lab Results  Component Value Date   WBC 6.4 03/25/2021   HGB 13.2 03/25/2021   HCT 38.9 (L) 03/25/2021   MCV 98.5 03/25/2021   PLT 178.0 03/25/2021   Lab Results  Component Value Date   NA 140 03/25/2021    K 4.4 03/25/2021   CO2 26 03/25/2021   GLUCOSE 206 (H) 03/25/2021  BUN 18 03/25/2021   CREATININE 0.97 03/25/2021   BILITOT 0.6 03/25/2021   ALKPHOS 59 03/25/2021   AST 41 (H) 03/25/2021   ALT 67 (H) 03/25/2021   PROT 7.3 03/25/2021   ALBUMIN 4.7 03/25/2021   CALCIUM 9.6 03/25/2021   GFR 87.17 03/25/2021   Lab Results  Component Value Date   CHOL 173 03/25/2021   Lab Results  Component Value Date   HDL 53.20 03/25/2021   Lab Results  Component Value Date   LDLCALC 129 (H) 02/03/2014   Lab Results  Component Value Date   TRIG 232.0 (H) 03/25/2021   Lab Results  Component Value Date   CHOLHDL 3 03/25/2021   Lab Results  Component Value Date   HGBA1C 8.3 (H) 03/25/2021       Assessment & Plan:   Problem List Items Addressed This Visit   None Visit Diagnoses     Bacterial conjunctivitis- left eye     -  Primary   Relevant Medications   trimethoprim-polymyxin b (POLYTRIM) ophthalmic solution   Other Relevant Orders   Visual acuity screening     Wife as has same symptoms, and so did granddaughter questionable if this is adenovirus related. Will cover bacterial infection.    Meds ordered this encounter  Medications   trimethoprim-polymyxin b (POLYTRIM) ophthalmic solution    Sig: Place 1 drop into the right eye every 4 (four) hours for 10 days. Instill 1 drop in affected eye(s) every 3 hours (maximum: 6 doses per day) for 10 days    Dispense:  10 mL    Refill:  0   Advised him to call his opthalmology as  well given his eye history today for a visit either today or tomorrow.   Red Flags discussed. The patient was given clear instructions to go to ER or return to medical center if any red flags develop, symptoms do not improve, worsen or new problems develop. They verbalized understanding.   Please schedule a follow up appointment in 2 days.    Marcille Buffy, FNP

## 2021-09-25 ENCOUNTER — Encounter: Payer: Self-pay | Admitting: Adult Health

## 2021-09-25 DIAGNOSIS — H109 Unspecified conjunctivitis: Secondary | ICD-10-CM | POA: Insufficient documentation

## 2021-10-08 ENCOUNTER — Other Ambulatory Visit: Payer: Self-pay

## 2021-10-25 ENCOUNTER — Ambulatory Visit: Payer: Self-pay | Admitting: Pharmacist

## 2021-10-25 NOTE — Chronic Care Management (AMB) (Signed)
?  Chronic Care Management  ? ?Note ? ?10/25/2021 ?Name: Phillip Shepard MRN: [id number] DOB: 1966-05-20 ? ? ? ?Closing pharmacy CCM case at this time. Will collaborate with Care Guide to outreach to schedule follow up with RN CM. Patient has clinic contact information for future questions or concerns.  ? ?Catie Darnelle Maffucci, PharmD, [place], CPP ?Clinical Pharmacist ?Therapist, music at [employer] ?[phone] ? ?

## 2021-10-28 ENCOUNTER — Other Ambulatory Visit: Payer: BC Managed Care – PPO

## 2021-11-01 ENCOUNTER — Ambulatory Visit: Payer: BC Managed Care – PPO | Admitting: Internal Medicine

## 2021-11-04 ENCOUNTER — Other Ambulatory Visit: Payer: Self-pay

## 2021-12-01 ENCOUNTER — Other Ambulatory Visit (INDEPENDENT_AMBULATORY_CARE_PROVIDER_SITE_OTHER): Payer: BC Managed Care – PPO

## 2021-12-01 DIAGNOSIS — E7800 Pure hypercholesterolemia, unspecified: Secondary | ICD-10-CM | POA: Diagnosis not present

## 2021-12-01 DIAGNOSIS — I10 Essential (primary) hypertension: Secondary | ICD-10-CM

## 2021-12-01 DIAGNOSIS — E1165 Type 2 diabetes mellitus with hyperglycemia: Secondary | ICD-10-CM

## 2021-12-01 LAB — CBC WITH DIFFERENTIAL/PLATELET
Basophils Absolute: 0.1 10*3/uL (ref 0.0–0.1)
Basophils Relative: 0.8 % (ref 0.0–3.0)
Eosinophils Absolute: 0.2 10*3/uL (ref 0.0–0.7)
Eosinophils Relative: 3.8 % (ref 0.0–5.0)
HCT: 40.5 % (ref 39.0–52.0)
Hemoglobin: 13.7 g/dL (ref 13.0–17.0)
Lymphocytes Relative: 29 % (ref 12.0–46.0)
Lymphs Abs: 1.8 10*3/uL (ref 0.7–4.0)
MCHC: 33.9 g/dL (ref 30.0–36.0)
MCV: 95.8 fl (ref 78.0–100.0)
Monocytes Absolute: 0.5 10*3/uL (ref 0.1–1.0)
Monocytes Relative: 8.4 % (ref 3.0–12.0)
Neutro Abs: 3.7 10*3/uL (ref 1.4–7.7)
Neutrophils Relative %: 58 % (ref 43.0–77.0)
Platelets: 203 10*3/uL (ref 150.0–400.0)
RBC: 4.23 Mil/uL (ref 4.22–5.81)
RDW: 12.6 % (ref 11.5–15.5)
WBC: 6.3 10*3/uL (ref 4.0–10.5)

## 2021-12-01 LAB — COMPREHENSIVE METABOLIC PANEL
ALT: 55 U/L — ABNORMAL HIGH (ref 0–53)
AST: 31 U/L (ref 0–37)
Albumin: 4.6 g/dL (ref 3.5–5.2)
Alkaline Phosphatase: 59 U/L (ref 39–117)
BUN: 23 mg/dL (ref 6–23)
CO2: 27 mEq/L (ref 19–32)
Calcium: 9.5 mg/dL (ref 8.4–10.5)
Chloride: 102 mEq/L (ref 96–112)
Creatinine, Ser: 1.14 mg/dL (ref 0.40–1.50)
GFR: 71.47 mL/min (ref >60.00)
Glucose, Bld: 262 mg/dL — ABNORMAL HIGH (ref 70–99)
Potassium: 4.4 mEq/L (ref 3.5–5.1)
Sodium: 140 mEq/L (ref 135–145)
Total Bilirubin: 0.5 mg/dL (ref 0.2–1.2)
Total Protein: 7 g/dL (ref 6.0–8.3)

## 2021-12-01 LAB — HEMOGLOBIN A1C: Hgb A1c MFr Bld: 8.6 % — ABNORMAL HIGH (ref 4.6–6.5)

## 2021-12-01 LAB — LIPID PANEL
Cholesterol: 162 mg/dL (ref 0–200)
HDL: 48.4 mg/dL (ref >39.00)
Total CHOL/HDL Ratio: 3
Triglycerides: 432 mg/dL — ABNORMAL HIGH (ref 0.0–149.0)

## 2021-12-01 LAB — LDL CHOLESTEROL, DIRECT: Direct LDL: 66 mg/dL

## 2021-12-02 ENCOUNTER — Telehealth: Payer: Self-pay | Admitting: *Deleted

## 2021-12-02 NOTE — Telephone Encounter (Signed)
Left message for patient need to see if patient okay moving appointment to 12/07/2021 at 3:30 if so block 1 pm on 12/03/2021 ?

## 2021-12-02 NOTE — Telephone Encounter (Signed)
Pt returning call.... Note is not completed... Pt requesting callback...  ?

## 2021-12-03 ENCOUNTER — Ambulatory Visit: Payer: BC Managed Care – PPO | Admitting: Internal Medicine

## 2021-12-03 ENCOUNTER — Other Ambulatory Visit: Payer: Self-pay

## 2021-12-07 ENCOUNTER — Encounter: Payer: Self-pay | Admitting: Internal Medicine

## 2021-12-07 ENCOUNTER — Ambulatory Visit: Payer: BC Managed Care – PPO | Admitting: Internal Medicine

## 2021-12-07 VITALS — BP 142/82 | HR 86 | Temp 98.6°F | Resp 17 | Ht 67.0 in | Wt 194.8 lb

## 2021-12-07 DIAGNOSIS — K219 Gastro-esophageal reflux disease without esophagitis: Secondary | ICD-10-CM

## 2021-12-07 DIAGNOSIS — E1165 Type 2 diabetes mellitus with hyperglycemia: Secondary | ICD-10-CM

## 2021-12-07 DIAGNOSIS — E7800 Pure hypercholesterolemia, unspecified: Secondary | ICD-10-CM

## 2021-12-07 DIAGNOSIS — E1159 Type 2 diabetes mellitus with other circulatory complications: Secondary | ICD-10-CM | POA: Diagnosis not present

## 2021-12-07 DIAGNOSIS — I10 Essential (primary) hypertension: Secondary | ICD-10-CM

## 2021-12-07 DIAGNOSIS — E538 Deficiency of other specified B group vitamins: Secondary | ICD-10-CM

## 2021-12-07 DIAGNOSIS — I152 Hypertension secondary to endocrine disorders: Secondary | ICD-10-CM

## 2021-12-07 DIAGNOSIS — F1029 Alcohol dependence with unspecified alcohol-induced disorder: Secondary | ICD-10-CM

## 2021-12-07 MED ORDER — TRESIBA FLEXTOUCH 100 UNIT/ML ~~LOC~~ SOPN
10.0000 [IU] | PEN_INJECTOR | Freq: Every day | SUBCUTANEOUS | 3 refills | Status: DC
Start: 2021-12-07 — End: 2023-05-12

## 2021-12-07 NOTE — Progress Notes (Signed)
[name]: Phillip Shepard, male   DOB: 07-04-1966, 56 y.o.   MRN: [id number] ? ? ?Subjective:  ? ? [name]: Phillip Shepard, male    DOB: 1966/03/23, 56 y.o.   MRN: [id number] ? ?This visit occurred during the SARS-CoV-2 public health emergency.  Safety protocols were in place, including screening questions prior to the visit, additional usage of staff PPE, and extensive cleaning of exam room while observing appropriate contact time as indicated for disinfecting solutions.  ? ?Patient here for a scheduled follow up.  ? ?Chief Complaint  ?Patient presents with  ? Follow-up  ?  Follow up for DM, HTN  ? .  ? ?HPI ?Increased stress recently with father's health issues.  Staying active.  No chest pain or sob reported.  Sugars have been uncontrolled.  On jardiance and ozempic.  Unable to take metformin.  Discussed recent labs.  A1c elevated - 8.6 on recent check.  Discussed treatment options.  Agreeable to basal insulin.  No acid reflux.  No abdominal pain.  Bowels stable.   ? ? ?Past Medical History:  ?Diagnosis Date  ? Allergy   ? Diabetes mellitus without complication ([place])   ? Dysphagia   ? s/p esophageal dilatation  ? GERD (gastroesophageal reflux disease)   ? History of chickenpox   ? History of kidney stones   ? Hypercholesterolemia   ? Hypertension   ? Kidney stones 07/18/2013  ? ?Past Surgical History:  ?Procedure Laterality Date  ? ESOPHAGEAL DILATION    ? TONSILLECTOMY  1970  ? ?Family History  ?Problem Relation Age of Onset  ? Hyperlipidemia Mother   ? Hypertension Father   ? Arthritis Maternal Grandmother   ? Diabetes Maternal Grandmother   ? Lung cancer Maternal Grandfather   ? Heart disease Paternal Grandfather   ? Stroke Paternal Grandfather   ? ?Social History  ? ?Socioeconomic History  ? Marital status: Married  ?  Spouse name: Diane  ? Number of children: 2  ? Years of education: Not on file  ? Highest education level: Not on file  ?Occupational History  ?  Employer: [employer]  ?Tobacco Use   ? Smoking status: Never  ? Smokeless tobacco: Former  ?  Types: Chew  ? Tobacco comments:  ?  [place]  ?Substance and Sexual Activity  ? Alcohol use: Yes  ?  Alcohol/week: 0.0 standard drinks  ?  Comment: drinks 3-4 beers per day  ? Drug use: No  ? Sexual activity: Yes  ?Other Topics Concern  ? Not on file  ?Social History Narrative  ? Not on file  ? ?Social Determinants of Health  ? ?Financial Resource Strain: Low Risk   ? Difficulty of Paying Living Expenses: Not hard at all  ?Food Insecurity: Not on file  ?Transportation Needs: Not on file  ?Physical Activity: Not on file  ?Stress: Not on file  ?Social Connections: Not on file  ? ? ? ?Review of Systems  ?Constitutional:  Negative for appetite change and unexpected weight change.  ?HENT:  Negative for congestion and sinus pressure.   ?Respiratory:  Negative for cough, chest tightness and shortness of breath.   ?Cardiovascular:  Negative for chest pain, palpitations and leg swelling.  ?Gastrointestinal:  Negative for abdominal pain, diarrhea, nausea and vomiting.  ?Genitourinary:  Negative for difficulty urinating and dysuria.  ?Musculoskeletal:  Negative for joint swelling and myalgias.  ?Skin:  Negative for color change and rash.  ?Neurological:  Negative for dizziness, light-headedness and  headaches.  ?Psychiatric/Behavioral:  Negative for agitation and dysphoric mood.   ? ?   ?Objective:  ?  ? ?BP (!) 142/82 (BP Location: Left Arm, Patient Position: Sitting, Cuff Size: Small)   Pulse 86   Temp 98.6 ?F (37 ?C) (Temporal)   Resp 17   Ht 5\' 7"  (1.702 m)   Wt 194 lb 12.8 oz (88.4 kg)   SpO2 98%   BMI 30.51 kg/m?  ?Wt Readings from Last 3 Encounters:  ?12/07/21 194 lb 12.8 oz (88.4 kg)  ?09/22/21 195 lb (88.5 kg)  ?03/25/21 187 lb 9.6 oz (85.1 kg)  ? ? ?Physical Exam ? ? ?Outpatient Encounter Medications as of 12/07/2021  ?Medication Sig  ? empagliflozin (JARDIANCE) 25 MG TABS tablet Take 1 tablet (25 mg total) by mouth daily.  ? insulin degludec (TRESIBA  FLEXTOUCH) 100 UNIT/ML FlexTouch Pen Inject 10 Units into the skin daily.  ? lisinopril (ZESTRIL) 20 MG tablet TAKE 1 TABLET BY MOUTH EVERY DAY  ? omeprazole (PRILOSEC) 20 MG capsule Take 20 mg by mouth daily.  ? rosuvastatin (CRESTOR) 40 MG tablet Take 1 tablet (40 mg total) by mouth daily.  ? Semaglutide, 2 MG/DOSE, (OZEMPIC, 2 MG/DOSE,) 8 MG/3ML SOPN Inject 2mg  into the skin once a week  ? ?No facility-administered encounter medications on file as of 12/07/2021.  ?  ? ?Lab Results  ?Component Value Date  ? WBC 6.3 12/01/2021  ? HGB 13.7 12/01/2021  ? HCT 40.5 12/01/2021  ? PLT 203.0 12/01/2021  ? GLUCOSE 262 (H) 12/01/2021  ? CHOL 162 12/01/2021  ? TRIG (H) 12/01/2021  ?  432.0 Triglyceride is over 400; calculations on Lipids are invalid.  ? HDL 48.40 12/01/2021  ? LDLDIRECT 66.0 12/01/2021  ? LDLCALC 129 (H) 02/03/2014  ? ALT 55 (H) 12/01/2021  ? AST 31 12/01/2021  ? NA 140 12/01/2021  ? K 4.4 12/01/2021  ? CL 102 12/01/2021  ? CREATININE 1.14 12/01/2021  ? BUN 23 12/01/2021  ? CO2 27 12/01/2021  ? TSH 2.49 03/25/2021  ? PSA 0.21 03/25/2021  ? HGBA1C 8.6 (H) 12/01/2021  ? MICROALBUR 4.1 (H) 03/25/2021  ? ? ?   ?Assessment & Plan:  ? ?Problem List Items Addressed This Visit   ? ? Alcohol dependence ([place])  ?  Discussed the need to decrease alcohol intake.  Follow.  ? ?  ?  ? B12 deficiency  ?  Follow b12 level.  ? ?  ?  ? Diabetes ([place])  ?  On ozempic and jardiance.  Sugars poorly controlled.  a1c elevated - 8.6.  Discussed diet and exercise.  Discussed treatment options.  Start tresiba 10 units per day.  Follow sugars.  Follow metabolic panel.  Instructed on how to inject tresiba.  ? ?  ?  ? Relevant Medications  ? insulin degludec (TRESIBA FLEXTOUCH) 100 UNIT/ML FlexTouch Pen  ? Other Relevant Orders  ? Basic Metabolic Panel (BMET)  ? Lipid Profile  ? Hepatic function panel  ? HgB A1c  ? Essential hypertension, benign  ?  On lisinopril.  Have him spot check pressures.  Follow pressures and metabolic panel.  If  persistent elevation, will require adjustment in medication.  ? ?  ?  ? Relevant Orders  ? Basic Metabolic Panel (BMET)  ? Lipid Profile  ? Hepatic function panel  ? HgB A1c  ? GERD (gastroesophageal reflux disease)  ?  No upper symptoms reported.  On prilosec.  ? ?  ?  ? Hypercholesterolemia -  Primary  ?  On crestor.  Low cholesterol diet and exercise.  Follow lipid panel and liver function tests.   ? ?  ?  ? Relevant Orders  ? Basic Metabolic Panel (BMET)  ? Lipid Profile  ? Hepatic function panel  ? HgB A1c  ? Hypertension associated with diabetes ([place])  ?  Blood pressure as outlined.  On lisinopril.  Have him spot check pressure.  Hold on making changes.  Follow pressure.  Follow metabolic panel.  ? ?  ?  ? Relevant Medications  ? insulin degludec (TRESIBA FLEXTOUCH) 100 UNIT/ML FlexTouch Pen  ? ? ? ?Einar Pheasant, MD  ?

## 2021-12-11 ENCOUNTER — Encounter: Payer: Self-pay | Admitting: Internal Medicine

## 2021-12-11 NOTE — Assessment & Plan Note (Signed)
Follow b12 level.  ?

## 2021-12-11 NOTE — Assessment & Plan Note (Addendum)
Blood pressure as outlined.  On lisinopril.  Have him spot check pressure.  Hold on making changes.  Follow pressure.  Follow metabolic panel.  ?

## 2021-12-11 NOTE — Assessment & Plan Note (Signed)
No upper symptoms reported. On prilosec.  

## 2021-12-11 NOTE — Assessment & Plan Note (Signed)
Discussed the need to decrease alcohol intake.  Follow.   

## 2021-12-11 NOTE — Assessment & Plan Note (Signed)
On ozempic and jardiance.  Sugars poorly controlled.  a1c elevated - 8.6.  Discussed diet and exercise.  Discussed treatment options.  Start tresiba 10 units per day.  Follow sugars.  Follow metabolic panel.  Instructed on how to inject tresiba.  ?

## 2021-12-11 NOTE — Assessment & Plan Note (Signed)
On lisinopril.  Have him spot check pressures.  Follow pressures and metabolic panel.  If persistent elevation, will require adjustment in medication.  ?

## 2021-12-11 NOTE — Assessment & Plan Note (Signed)
On crestor.  Low cholesterol diet and exercise.  Follow lipid panel and liver function tests.   

## 2021-12-27 ENCOUNTER — Telehealth: Payer: BC Managed Care – PPO

## 2022-01-04 ENCOUNTER — Other Ambulatory Visit: Payer: Self-pay

## 2022-01-28 ENCOUNTER — Ambulatory Visit: Payer: BC Managed Care – PPO | Admitting: Internal Medicine

## 2022-01-28 ENCOUNTER — Encounter: Payer: Self-pay | Admitting: Internal Medicine

## 2022-01-28 DIAGNOSIS — K219 Gastro-esophageal reflux disease without esophagitis: Secondary | ICD-10-CM

## 2022-01-28 DIAGNOSIS — I10 Essential (primary) hypertension: Secondary | ICD-10-CM | POA: Diagnosis not present

## 2022-01-28 DIAGNOSIS — R7989 Other specified abnormal findings of blood chemistry: Secondary | ICD-10-CM

## 2022-01-28 DIAGNOSIS — E7800 Pure hypercholesterolemia, unspecified: Secondary | ICD-10-CM

## 2022-01-28 DIAGNOSIS — E538 Deficiency of other specified B group vitamins: Secondary | ICD-10-CM

## 2022-01-28 DIAGNOSIS — F1029 Alcohol dependence with unspecified alcohol-induced disorder: Secondary | ICD-10-CM | POA: Diagnosis not present

## 2022-01-28 DIAGNOSIS — E1165 Type 2 diabetes mellitus with hyperglycemia: Secondary | ICD-10-CM

## 2022-01-28 MED ORDER — GABAPENTIN 300 MG PO CAPS: 300.0000 mg | ORAL_CAPSULE | Freq: Every day | ORAL | 2 refills | Status: DC

## 2022-01-28 NOTE — Progress Notes (Signed)
[name]: Phillip Shepard, male   DOB: 1966/03/26, 56 y.o.   MRN: [id number]   Subjective:    [name]: Phillip Shepard, male    DOB: [birth year], 56 y.o.   MRN: [id number]   Patient here for a scheduled follow up.  Marland Kitchen  HPI Here to follow up regarding his diabetes, cholesterol and blood pressure.  Was started on tresiba last visit.  On jardiance and ozempic.  Unable to take metformin.  He has not been taking tresiba regularly.  May average a few days per week.  States sugars averaging 102-170.  Discussed low carb diet and exercise.  No chest pain.  Breathing stable.  No increased cough or congestion.  No abdominal pain.  Bowels moving.    Past Medical History:  Diagnosis Date   Allergy    Diabetes mellitus without complication ([place])    Dysphagia    s/p esophageal dilatation   GERD (gastroesophageal reflux disease)    History of chickenpox    History of kidney stones    Hypercholesterolemia    Hypertension    Kidney stones 07/18/2013   Past Surgical History:  Procedure Laterality Date   ESOPHAGEAL DILATION     TONSILLECTOMY  1970   Family History  Problem Relation Age of Onset   Hyperlipidemia Mother    Hypertension Father    Arthritis Maternal Grandmother    Diabetes Maternal Grandmother    Lung cancer Maternal Grandfather    Heart disease Paternal Grandfather    Stroke Paternal Grandfather    Social History   Socioeconomic History   Marital status: Married    Spouse name: Diane   Number of children: 2   Years of education: Not on file   Highest education level: Not on file  Occupational History    Employer: [employer]  Tobacco Use   Smoking status: Never   Smokeless tobacco: Former    Types: Chew   Tobacco comments:    [place]  Substance and Sexual Activity   Alcohol use: Yes    Alcohol/week: 0.0 standard drinks of alcohol    Comment: drinks 3-4 beers per day   Drug use: No   Sexual activity: Yes  Other Topics Concern   Not on file  Social  History Narrative   Not on file   Social Determinants of Health   Financial Resource Strain: Low Risk  (08/10/2021)   Overall Financial Resource Strain (CARDIA)    Difficulty of Paying Living Expenses: Not hard at all  Food Insecurity: Not on file  Transportation Needs: Not on file  Physical Activity: Not on file  Stress: Not on file  Social Connections: Not on file     Review of Systems  Constitutional:  Negative for appetite change and unexpected weight change.  HENT:  Negative for congestion and sinus pressure.   Respiratory:  Negative for cough, chest tightness and shortness of breath.   Cardiovascular:  Negative for chest pain, palpitations and leg swelling.  Gastrointestinal:  Negative for abdominal pain, diarrhea, nausea and vomiting.  Genitourinary:  Negative for difficulty urinating and dysuria.  Musculoskeletal:  Negative for joint swelling and myalgias.  Skin:  Negative for color change and rash.  Neurological:  Negative for dizziness, light-headedness and headaches.  Psychiatric/Behavioral:  Negative for agitation and dysphoric mood.        Objective:     BP 132/80 (BP Location: Left Arm, Patient Position: Sitting, Cuff Size: Small)   Pulse 76   Temp 98.3 F (  36.8 C) (Temporal)   Resp 15   Ht 5' 7"  (1.702 m)   Wt 197 lb 9.6 oz (89.6 kg)   SpO2 99%   BMI 30.95 kg/m  Wt Readings from Last 3 Encounters:  01/28/22 197 lb 9.6 oz (89.6 kg)  12/07/21 194 lb 12.8 oz (88.4 kg)  09/22/21 195 lb (88.5 kg)    Physical Exam Constitutional:      General: He is not in acute distress.    Appearance: Normal appearance. He is well-developed.  HENT:     Head: Normocephalic and atraumatic.     Right Ear: External ear normal.     Left Ear: External ear normal.  Eyes:     General: No scleral icterus.       Right eye: No discharge.        Left eye: No discharge.  Cardiovascular:     Rate and Rhythm: Normal rate and regular rhythm.  Pulmonary:     Effort: Pulmonary  effort is normal. No respiratory distress.     Breath sounds: Normal breath sounds.  Abdominal:     General: Bowel sounds are normal.     Palpations: Abdomen is soft.     Tenderness: There is no abdominal tenderness.  Musculoskeletal:        General: No swelling or tenderness.     Cervical back: Neck supple. No tenderness.  Lymphadenopathy:     Cervical: No cervical adenopathy.  Skin:    Findings: No erythema or rash.  Neurological:     Mental Status: He is alert.  Psychiatric:        Mood and Affect: Mood normal.        Behavior: Behavior normal.      Outpatient Encounter Medications as of 01/28/2022  Medication Sig   empagliflozin (JARDIANCE) 25 MG TABS tablet Take 1 tablet (25 mg total) by mouth daily.   insulin degludec (TRESIBA FLEXTOUCH) 100 UNIT/ML FlexTouch Pen Inject 10 Units into the skin daily.   lisinopril (ZESTRIL) 20 MG tablet TAKE 1 TABLET BY MOUTH EVERY DAY   omeprazole (PRILOSEC) 20 MG capsule Take 20 mg by mouth daily.   rosuvastatin (CRESTOR) 40 MG tablet Take 1 tablet (40 mg total) by mouth daily.   [DISCONTINUED] gabapentin (NEURONTIN) 300 MG capsule Take 1 capsule (300 mg total) by mouth daily.   [DISCONTINUED] Semaglutide, 2 MG/DOSE, (OZEMPIC, 2 MG/DOSE,) 8 MG/3ML SOPN Inject 35m into the skin once a week   No facility-administered encounter medications on file as of 01/28/2022.     Lab Results  Component Value Date   WBC 6.3 12/01/2021   HGB 13.7 12/01/2021   HCT 40.5 12/01/2021   PLT 203.0 12/01/2021   GLUCOSE 262 (H) 12/01/2021   CHOL 162 12/01/2021   TRIG (H) 12/01/2021    432.0 Triglyceride is over 400; calculations on Lipids are invalid.   HDL 48.40 12/01/2021   LDLDIRECT 66.0 12/01/2021   LDLCALC 129 (H) 02/03/2014   ALT 55 (H) 12/01/2021   AST 31 12/01/2021   NA 140 12/01/2021   K 4.4 12/01/2021   CL 102 12/01/2021   CREATININE 1.14 12/01/2021   BUN 23 12/01/2021   CO2 27 12/01/2021   TSH 2.49 03/25/2021   PSA 0.21 03/25/2021    HGBA1C 8.6 (H) 12/01/2021   MICROALBUR 4.1 (H) 03/25/2021      Assessment & Plan:   Problem List Items Addressed This Visit     Alcohol dependence ([place]    Discussed the need  to decrease alcohol intake.  Follow.       B12 deficiency    Check B12 level with next labs.       Diabetes ([place])    Was started on tresiba last visit.  On jardiance and ozempic.  Unable to take metformin.  He has not been taking tresiba regularly.  May average a few days per week.  States sugars averaging 102-170.  Discussed low carb diet and exercise. Discussed importance of taking his medication regularly.  Follow met b and a1c.       Elevated liver function tests    Have discussed diet and exercise.  Have discussed the need to decrease alcohol intake.  Follow liver panel.       Essential hypertension, benign    On lisinopril. Blood pressure as outlined.  Have him spot check pressures.  Follow pressures and metabolic panel.       GERD (gastroesophageal reflux disease)    No upper symptoms reported.  On prilosec.       Hypercholesterolemia    On crestor.  Low cholesterol diet and exercise.  Follow lipid panel and liver function tests.          Einar Pheasant, MD

## 2022-02-01 ENCOUNTER — Telehealth: Payer: Self-pay | Admitting: Internal Medicine

## 2022-02-01 ENCOUNTER — Other Ambulatory Visit: Payer: Self-pay

## 2022-02-01 NOTE — Telephone Encounter (Signed)
Pt called in stating that he saw Dr Nicki Reaper last week... Pt stated that Dr. Nicki Reaper prescribe him medication (gabapentin (NEURONTIN) 300 MG capsule)... Pt stated that his wife got a text stating that the medication is ready for pickup... Pt stated that that we he got to the pharmacy they had no records of the medication.... Called pharmacy to confirm, spoke with Levada Dy, she stated that they have no records of the medication being sent over... Pt requesting callback

## 2022-02-01 NOTE — Telephone Encounter (Signed)
Rx sent to dr scott for signature

## 2022-02-02 ENCOUNTER — Other Ambulatory Visit: Payer: Self-pay | Admitting: Internal Medicine

## 2022-02-02 ENCOUNTER — Other Ambulatory Visit: Payer: Self-pay

## 2022-02-02 DIAGNOSIS — E1165 Type 2 diabetes mellitus with hyperglycemia: Secondary | ICD-10-CM

## 2022-02-02 MED ORDER — GABAPENTIN 300 MG PO CAPS: 300.0000 mg | ORAL_CAPSULE | Freq: Every day | ORAL | 2 refills | Status: DC

## 2022-02-02 MED ORDER — OZEMPIC (2 MG/DOSE) 8 MG/3ML ~~LOC~~ SOPN
PEN_INJECTOR | SUBCUTANEOUS | 1 refills | Status: DC
  Filled 2022-02-02: qty 9, 84d supply, fill #0

## 2022-02-02 NOTE — Telephone Encounter (Signed)
Rx sent in again to [facility] pharmacy.  Please notify pt and let me know if any problems.

## 2022-02-02 NOTE — Telephone Encounter (Signed)
Please confirm [facility] is correct pharmacy.  [facility] pharmacy is also listed.

## 2022-02-06 ENCOUNTER — Encounter: Payer: Self-pay | Admitting: Internal Medicine

## 2022-02-06 NOTE — Assessment & Plan Note (Signed)
On lisinopril. Blood pressure as outlined.  Have him spot check pressures.  Follow pressures and metabolic panel.

## 2022-02-06 NOTE — Assessment & Plan Note (Signed)
Discussed the need to decrease alcohol intake.  Follow.   

## 2022-02-06 NOTE — Assessment & Plan Note (Signed)
Check B12 level with next labs.  

## 2022-02-06 NOTE — Assessment & Plan Note (Addendum)
Have discussed diet and exercise.  Have discussed the need to decrease alcohol intake.  Follow liver panel.

## 2022-02-06 NOTE — Assessment & Plan Note (Signed)
On crestor.  Low cholesterol diet and exercise.  Follow lipid panel and liver function tests.   

## 2022-02-06 NOTE — Assessment & Plan Note (Signed)
No upper symptoms reported. On prilosec.  

## 2022-02-06 NOTE — Assessment & Plan Note (Signed)
Was started on tresiba last visit.  On jardiance and ozempic.  Unable to take metformin.  He has not been taking tresiba regularly.  May average a few days per week.  States sugars averaging 102-170.  Discussed low carb diet and exercise. Discussed importance of taking his medication regularly.  Follow met b and a1c.

## 2022-02-26 ENCOUNTER — Other Ambulatory Visit: Payer: Self-pay | Admitting: Internal Medicine

## 2022-02-26 DIAGNOSIS — E1165 Type 2 diabetes mellitus with hyperglycemia: Secondary | ICD-10-CM

## 2022-03-10 ENCOUNTER — Other Ambulatory Visit: Payer: BC Managed Care – PPO

## 2022-03-17 ENCOUNTER — Other Ambulatory Visit: Payer: BC Managed Care – PPO

## 2022-03-22 ENCOUNTER — Ambulatory Visit: Payer: BC Managed Care – PPO | Admitting: Internal Medicine

## 2022-04-13 ENCOUNTER — Telehealth: Payer: Self-pay | Admitting: Internal Medicine

## 2022-04-13 NOTE — Telephone Encounter (Signed)
Pt wife called in stating pt has a bad constant cough. Pt wife would like to know what the provider recommends

## 2022-04-13 NOTE — Telephone Encounter (Signed)
Pt w/ persistent cough going on for "weeks" Stated doesn't feel sick, just cant kick the cough. Denies sob, chest pain, fever. Scheduled with Dr Clent Ridges tomorrow 4pm virtual

## 2022-04-14 ENCOUNTER — Telehealth: Payer: BC Managed Care – PPO | Admitting: Family Medicine

## 2022-04-15 ENCOUNTER — Telehealth: Payer: BC Managed Care – PPO | Admitting: Family Medicine

## 2022-11-01 ENCOUNTER — Other Ambulatory Visit: Payer: Self-pay | Admitting: Internal Medicine

## 2022-12-27 ENCOUNTER — Other Ambulatory Visit: Payer: Self-pay | Admitting: Internal Medicine

## 2022-12-28 NOTE — Telephone Encounter (Signed)
Left voicemail for pt to return call. PT needs appt for medication refills. Last seen in June 2023.  I have sent in a 30 day supply with no refills on the [facility]opril to [facility].

## 2023-03-06 ENCOUNTER — Other Ambulatory Visit: Payer: Self-pay | Admitting: Internal Medicine

## 2023-03-08 NOTE — Telephone Encounter (Signed)
Please refuse. Patient cannot have 90 day supply until he has a visit with you. Have not seen him in over 1 year

## 2023-03-15 ENCOUNTER — Other Ambulatory Visit: Payer: Self-pay | Admitting: Internal Medicine

## 2023-03-16 NOTE — Telephone Encounter (Signed)
LMTCB. Pt is over due for an appt with Dr. Lorin Picket.

## 2023-04-24 ENCOUNTER — Other Ambulatory Visit: Payer: Self-pay | Admitting: Internal Medicine

## 2023-04-24 DIAGNOSIS — E1165 Type 2 diabetes mellitus with hyperglycemia: Secondary | ICD-10-CM

## 2023-04-26 MED ORDER — LISINOPRIL 20 MG PO TABS: 20.0000 mg | ORAL_TABLET | Freq: Every day | ORAL | 0 refills | Status: DC

## 2023-05-06 ENCOUNTER — Other Ambulatory Visit: Payer: Self-pay | Admitting: Internal Medicine

## 2023-05-11 ENCOUNTER — Other Ambulatory Visit: Payer: Self-pay | Admitting: Internal Medicine

## 2023-05-12 ENCOUNTER — Ambulatory Visit: Payer: BC Managed Care – PPO | Admitting: Internal Medicine

## 2023-05-12 ENCOUNTER — Encounter: Payer: Self-pay | Admitting: Internal Medicine

## 2023-05-12 VITALS — BP 128/78 | HR 91 | Temp 97.7°F | Ht 66.0 in | Wt 200.0 lb

## 2023-05-12 DIAGNOSIS — Z125 Encounter for screening for malignant neoplasm of prostate: Secondary | ICD-10-CM

## 2023-05-12 DIAGNOSIS — F1029 Alcohol dependence with unspecified alcohol-induced disorder: Secondary | ICD-10-CM

## 2023-05-12 DIAGNOSIS — Z7984 Long term (current) use of oral hypoglycemic drugs: Secondary | ICD-10-CM

## 2023-05-12 DIAGNOSIS — I10 Essential (primary) hypertension: Secondary | ICD-10-CM

## 2023-05-12 DIAGNOSIS — E1159 Type 2 diabetes mellitus with other circulatory complications: Secondary | ICD-10-CM

## 2023-05-12 DIAGNOSIS — E538 Deficiency of other specified B group vitamins: Secondary | ICD-10-CM | POA: Diagnosis not present

## 2023-05-12 DIAGNOSIS — I152 Hypertension secondary to endocrine disorders: Secondary | ICD-10-CM

## 2023-05-12 DIAGNOSIS — E7800 Pure hypercholesterolemia, unspecified: Secondary | ICD-10-CM

## 2023-05-12 DIAGNOSIS — K219 Gastro-esophageal reflux disease without esophagitis: Secondary | ICD-10-CM

## 2023-05-12 DIAGNOSIS — E1165 Type 2 diabetes mellitus with hyperglycemia: Secondary | ICD-10-CM | POA: Diagnosis not present

## 2023-05-12 DIAGNOSIS — G629 Polyneuropathy, unspecified: Secondary | ICD-10-CM

## 2023-05-12 DIAGNOSIS — R7989 Other specified abnormal findings of blood chemistry: Secondary | ICD-10-CM

## 2023-05-12 LAB — BASIC METABOLIC PANEL
BUN: 22 mg/dL (ref 6–23)
CO2: 26 mEq/L (ref 19–32)
Calcium: 9.3 mg/dL (ref 8.4–10.5)
Chloride: 100 mEq/L (ref 96–112)
Creatinine, Ser: 0.94 mg/dL (ref 0.40–1.50)
GFR: 89.18 mL/min (ref >60.00)
Glucose, Bld: 287 mg/dL — ABNORMAL HIGH (ref 70–99)
Potassium: 4.3 mEq/L (ref 3.5–5.1)
Sodium: 140 mEq/L (ref 135–145)

## 2023-05-12 LAB — CBC WITH DIFFERENTIAL/PLATELET
Basophils Absolute: 0.1 10*3/uL (ref 0.0–0.1)
Basophils Relative: 1.3 % (ref 0.0–3.0)
Eosinophils Absolute: 0.3 10*3/uL (ref 0.0–0.7)
Eosinophils Relative: 5.3 % — ABNORMAL HIGH (ref 0.0–5.0)
HCT: 40.5 % (ref 39.0–52.0)
Hemoglobin: 13.5 g/dL (ref 13.0–17.0)
Lymphocytes Relative: 20.9 % (ref 12.0–46.0)
Lymphs Abs: 1 10*3/uL (ref 0.7–4.0)
MCHC: 33.3 g/dL (ref 30.0–36.0)
MCV: 100.7 fl — ABNORMAL HIGH (ref 78.0–100.0)
Monocytes Absolute: 0.4 10*3/uL (ref 0.1–1.0)
Monocytes Relative: 8.6 % (ref 3.0–12.0)
Neutro Abs: 3.1 10*3/uL (ref 1.4–7.7)
Neutrophils Relative %: 63.9 % (ref 43.0–77.0)
Platelets: 173 10*3/uL (ref 150.0–400.0)
RBC: 4.02 Mil/uL — ABNORMAL LOW (ref 4.22–5.81)
RDW: 13 % (ref 11.5–15.5)
WBC: 4.9 10*3/uL (ref 4.0–10.5)

## 2023-05-12 LAB — PSA: PSA: 0.32 ng/mL (ref 0.10–4.00)

## 2023-05-12 LAB — MICROALBUMIN / CREATININE URINE RATIO
Creatinine,U: 36 mg/dL
Microalb Creat Ratio: 7.3 mg/g (ref 0.0–30.0)
Microalb, Ur: 2.6 mg/dL — ABNORMAL HIGH (ref 0.0–1.9)

## 2023-05-12 LAB — HEPATIC FUNCTION PANEL
ALT: 62 U/L — ABNORMAL HIGH (ref 0–53)
AST: 43 U/L — ABNORMAL HIGH (ref 0–37)
Albumin: 4.2 g/dL (ref 3.5–5.2)
Alkaline Phosphatase: 65 U/L (ref 39–117)
Bilirubin, Direct: 0.1 mg/dL (ref 0.0–0.3)
Total Bilirubin: 0.6 mg/dL (ref 0.2–1.2)
Total Protein: 6.4 g/dL (ref 6.0–8.3)

## 2023-05-12 LAB — VITAMIN B12: Vitamin B-12: 236 pg/mL (ref 211–911)

## 2023-05-12 LAB — HM DIABETES FOOT EXAM

## 2023-05-12 LAB — LDL CHOLESTEROL, DIRECT: Direct LDL: 164 mg/dL

## 2023-05-12 LAB — LIPID PANEL
Cholesterol: 301 mg/dL — ABNORMAL HIGH (ref 0–200)
HDL: 48.3 mg/dL (ref >39.00)
Total CHOL/HDL Ratio: 6
Triglycerides: 690 mg/dL — ABNORMAL HIGH (ref 0.0–149.0)

## 2023-05-12 LAB — HEMOGLOBIN A1C: Hgb A1c MFr Bld: 10.5 % — ABNORMAL HIGH (ref 4.6–6.5)

## 2023-05-12 LAB — TSH: TSH: 1.71 u[IU]/mL (ref 0.35–5.50)

## 2023-05-12 MED ORDER — LISINOPRIL 20 MG PO TABS: 20.0000 mg | ORAL_TABLET | Freq: Every day | ORAL | 1 refills | Status: DC

## 2023-05-12 NOTE — Assessment & Plan Note (Signed)
Blood pressure as outlined.  Recheck improved. On lisinopril.  Follow pressure.  Follow metabolic panel.

## 2023-05-12 NOTE — Assessment & Plan Note (Signed)
No upper symptoms reported. On prilosec.

## 2023-05-12 NOTE — Telephone Encounter (Signed)
Rx ok'd for lisinopril 20mg  #90 with one refill.

## 2023-05-12 NOTE — Assessment & Plan Note (Signed)
No longer on crestor.  Low cholesterol diet and exercise.  Follow lipid panel.

## 2023-05-12 NOTE — Assessment & Plan Note (Signed)
Have discussed diet and exercise.  Have discussed the need to decrease alcohol intake.  Follow liver panel.

## 2023-05-12 NOTE — Assessment & Plan Note (Signed)
Check B12 level with next labs.

## 2023-05-12 NOTE — Progress Notes (Signed)
Subjective:    [name]: Phillip Shepard, male    DOB: Apr 26, 1966, 57 y.o.   MRN: [id number]  Patient here for  Chief Complaint  Patient presents with   Medication Management    HPI Here to follow up regarding his diabetes, cholesterol and blood pressure. Have not seen him since 01/2022. On jardiancel.  Off ozempic.  Unable to take metformin.  Was previously started on tresiba. No longer taking.  No recorded sugars.  Does not check regularly.  No chest pain or sob reported.  Stays active.  No cough or congestion.  No abdominal pain or bowel change.  Still drinking.  Discussed alcohol intake.  Was seeing Dr Mayford Knife.  States was informed to stop ozempic. Was placed on berberine.  Does report some symptoms that appear to be c/w neuropathy.  Feels like toes/end of foot swollen  - but no swelling.  Discussed calcium score.    Past Medical History:  Diagnosis Date   Allergy    Diabetes mellitus without complication (HCC)    Dysphagia    s/p esophageal dilatation   GERD (gastroesophageal reflux disease)    History of chickenpox    History of kidney stones    Hypercholesterolemia    Hypertension    Kidney stones 07/18/2013   Past Surgical History:  Procedure Laterality Date   ESOPHAGEAL DILATION     TONSILLECTOMY  1970   Family History  Problem Relation Age of Onset   Hyperlipidemia Mother    Hypertension Father    Arthritis Maternal Grandmother    Diabetes Maternal Grandmother    Lung cancer Maternal Grandfather    Heart disease Paternal Grandfather    Stroke Paternal Grandfather    Social History   Socioeconomic History   Marital status: Married    Spouse name: Diane   Number of children: 2   Years of education: Not on file   Highest education level: Not on file  Occupational History    Employer: [employer]  Tobacco Use   Smoking status: Never   Smokeless tobacco: Former    Types: Chew   Tobacco comments:    [place]  Substance and Sexual Activity    Alcohol use: Yes    Alcohol/week: 0.0 standard drinks of alcohol    Comment: drinks 3-4 beers per day   Drug use: No   Sexual activity: Yes  Other Topics Concern   Not on file  Social History Narrative   Not on file   Social Determinants of Health   Financial Resource Strain: Low Risk  (08/10/2021)   Overall Financial Resource Strain (CARDIA)    Difficulty of Paying Living Expenses: Not hard at all  Food Insecurity: Not on file  Transportation Needs: Not on file  Physical Activity: Not on file  Stress: Not on file  Social Connections: Not on file     Review of Systems  Constitutional:  Negative for appetite change and unexpected weight change.  HENT:  Negative for congestion and sinus pressure.   Respiratory:  Negative for cough, chest tightness and shortness of breath.   Cardiovascular:  Negative for chest pain, palpitations and leg swelling.  Gastrointestinal:  Negative for abdominal pain, diarrhea, nausea and vomiting.  Genitourinary:  Negative for difficulty urinating and dysuria.  Musculoskeletal:  Negative for joint swelling and myalgias.  Skin:  Negative for color change and rash.  Neurological:  Negative for dizziness and headaches.  Psychiatric/Behavioral:  Negative for agitation and dysphoric mood.  Objective:     BP 128/78   Pulse 91   Temp 97.7 F (36.5 C) (Oral)   Ht 5\' 6"  (1.676 m)   Wt 200 lb (90.7 kg)   SpO2 99%   BMI 32.28 kg/m  Wt Readings from Last 3 Encounters:  05/12/23 200 lb (90.7 kg)  01/28/22 197 lb 9.6 oz (89.6 kg)  12/07/21 194 lb 12.8 oz (88.4 kg)    Physical Exam Constitutional:      General: He is not in acute distress.    Appearance: Normal appearance. He is well-developed.  HENT:     Head: Normocephalic and atraumatic.     Right Ear: External ear normal.     Left Ear: External ear normal.  Eyes:     General: No scleral icterus.       Right eye: No discharge.        Left eye: No discharge.  Cardiovascular:      Rate and Rhythm: Normal rate and regular rhythm.  Pulmonary:     Effort: Pulmonary effort is normal. No respiratory distress.     Breath sounds: Normal breath sounds.  Abdominal:     General: Bowel sounds are normal.     Palpations: Abdomen is soft.     Tenderness: There is no abdominal tenderness.  Musculoskeletal:        General: No swelling or tenderness.     Cervical back: Neck supple. No tenderness.  Lymphadenopathy:     Cervical: No cervical adenopathy.  Skin:    Findings: No erythema or rash.  Neurological:     Mental Status: He is alert.  Psychiatric:        Mood and Affect: Mood normal.        Behavior: Behavior normal.      Outpatient Encounter Medications as of 05/12/2023  Medication Sig   empagliflozin (JARDIANCE) 25 MG TABS tablet TAKE 1 TABLET (25 MG TOTAL) BY MOUTH DAILY.   lisinopril (ZESTRIL) 20 MG tablet TAKE 1 TABLET (20 MG TOTAL) BY MOUTH DAILY. NO MORE REFILLS UNTIL SEEN BY PCP   omeprazole (PRILOSEC) 20 MG capsule Take 20 mg by mouth daily.   [DISCONTINUED] gabapentin (NEURONTIN) 300 MG capsule Take 1 capsule (300 mg total) by mouth daily.   [DISCONTINUED] insulin degludec (TRESIBA FLEXTOUCH) 100 UNIT/ML FlexTouch Pen Inject 10 Units into the skin daily.   [DISCONTINUED] rosuvastatin (CRESTOR) 40 MG tablet Take 1 tablet (40 mg total) by mouth daily.   [DISCONTINUED] Semaglutide, 2 MG/DOSE, (OZEMPIC, 2 MG/DOSE,) 8 MG/3ML SOPN Inject 2mg  into the skin once a week   No facility-administered encounter medications on file as of 05/12/2023.     Lab Results  Component Value Date   WBC 6.3 12/01/2021   HGB 13.7 12/01/2021   HCT 40.5 12/01/2021   PLT 203.0 12/01/2021   GLUCOSE 262 (H) 12/01/2021   CHOL 162 12/01/2021   TRIG (H) 12/01/2021    432.0 Triglyceride is over 400; calculations on Lipids are invalid.   HDL 48.40 12/01/2021   LDLDIRECT 66.0 12/01/2021   LDLCALC 129 (H) 02/03/2014   ALT 55 (H) 12/01/2021   AST 31 12/01/2021   NA 140 12/01/2021   K  4.4 12/01/2021   CL 102 12/01/2021   CREATININE 1.14 12/01/2021   BUN 23 12/01/2021   CO2 27 12/01/2021   TSH 2.49 03/25/2021   PSA 0.21 03/25/2021   HGBA1C 8.6 (H) 12/01/2021   MICROALBUR 4.1 (H) 03/25/2021       Assessment &  Plan:  Type 2 diabetes mellitus with hyperglycemia, without long-term current use of insulin (HCC) Assessment & Plan: Only on jardiance.  Off ozempic.  Discussed diet and exercise.  Intolerant to metformin.  Check met b and A1c.  Discussed other treatment options.  No recorded sugars.  Await labs. Further changes pending results.   Orders: -     Hemoglobin A1c -     Microalbumin / creatinine urine ratio  Hypercholesterolemia Assessment & Plan: No longer on crestor.  Low cholesterol diet and exercise.  Follow lipid panel.   Orders: -     Lipid panel -     Hepatic function panel  Essential hypertension, benign Assessment & Plan: On lisinopril. Blood pressure as outlined.  Have him spot check pressures.  Follow pressures and metabolic panel.   Orders: -     Basic metabolic panel -     CBC with Differential/Platelet -     TSH -     Microalbumin / creatinine urine ratio  Prostate cancer screening -     PSA  Alcohol dependence with unspecified alcohol-induced disorder (HCC) Assessment & Plan: Discussed the need to decrease alcohol intake.  Follow.    B12 deficiency Assessment & Plan: Check B12 level with next labs.   Orders: -     Vitamin B12  Elevated liver function tests Assessment & Plan: Have discussed diet and exercise.  Have discussed the need to decrease alcohol intake.  Follow liver panel.    Gastroesophageal reflux disease without esophagitis Assessment & Plan: No upper symptoms reported.  On prilosec.    Hypertension associated with diabetes (HCC) Assessment & Plan: Blood pressure as outlined.  Recheck improved. On lisinopril.  Follow pressure.  Follow metabolic panel.    Neuropathy Assessment & Plan: Check B12 level.   Discussed alpha lipoic acid.        [name] Bern, MD

## 2023-05-12 NOTE — Assessment & Plan Note (Signed)
On lisinopril. Blood pressure as outlined.  Have him spot check pressures.  Follow pressures and metabolic panel.

## 2023-05-12 NOTE — Assessment & Plan Note (Signed)
Check B12 level.  Discussed alpha lipoic acid.

## 2023-05-12 NOTE — Assessment & Plan Note (Signed)
Only on jardiance.  Off ozempic.  Discussed diet and exercise.  Intolerant to metformin.  Check met b and A1c.  Discussed other treatment options.  No recorded sugars.  Await labs. Further changes pending results.

## 2023-05-12 NOTE — Assessment & Plan Note (Signed)
Discussed the need to decrease alcohol intake.  Follow.

## 2023-05-18 ENCOUNTER — Encounter: Payer: Self-pay | Admitting: *Deleted

## 2023-05-18 ENCOUNTER — Telehealth: Payer: Self-pay | Admitting: *Deleted

## 2023-05-18 NOTE — Telephone Encounter (Addendum)
-----   Message from Fritch sent at 05/15/2023  5:00 AM EDT -----   Notify - overall sugar control is elevated.  A1c 10.5.  he is currently on jardiance.  Intolerance to metformin.  Informed me he did not want to take ozempic/mounjaro. Given level of sugars, needs to start insulin.  Start tresiba 10 units per day.  Low carb diet.  Send [facility] lipid diet.  Needs to check and record sugars.  Also refer to pharmacy [facility]) for further treatment.  Also, triglycerides are significantly elevated.  Liver function tests elevated as well.  Could be due to fatty liver.  Need to confirm.  Need to schedule an abdominal ultrasound to better evaluate his liver.  Decrease alcohol intake.  PSA wnl.  B12 is in the normal limits, but a low end of normal.  Have him start oral B12 q day.  Hemoglobin and kidney function tests are normal.  Would also like to start crestor - given LDL and history of diabetes.

## 2023-05-18 NOTE — Telephone Encounter (Signed)
Left voicemail on 05/16/23 & sent mychart on 05/18/23.  See below

## 2023-05-24 ENCOUNTER — Other Ambulatory Visit: Payer: Self-pay | Admitting: Internal Medicine

## 2023-05-24 DIAGNOSIS — E1165 Type 2 diabetes mellitus with hyperglycemia: Secondary | ICD-10-CM

## 2023-09-12 ENCOUNTER — Encounter: Payer: BC Managed Care – PPO | Admitting: Internal Medicine

## 2023-11-16 ENCOUNTER — Encounter: Payer: BC Managed Care – PPO | Admitting: Internal Medicine

## 2023-11-27 ENCOUNTER — Ambulatory Visit (INDEPENDENT_AMBULATORY_CARE_PROVIDER_SITE_OTHER): Admitting: Internal Medicine

## 2023-11-27 VITALS — BP 132/72 | HR 85 | Temp 98.0°F | Resp 16 | Ht 67.0 in | Wt 200.0 lb

## 2023-11-27 DIAGNOSIS — E1165 Type 2 diabetes mellitus with hyperglycemia: Secondary | ICD-10-CM

## 2023-11-27 DIAGNOSIS — Z0000 Encounter for general adult medical examination without abnormal findings: Secondary | ICD-10-CM

## 2023-11-27 DIAGNOSIS — E7800 Pure hypercholesterolemia, unspecified: Secondary | ICD-10-CM

## 2023-11-27 DIAGNOSIS — E538 Deficiency of other specified B group vitamins: Secondary | ICD-10-CM

## 2023-11-27 DIAGNOSIS — I10 Essential (primary) hypertension: Secondary | ICD-10-CM | POA: Diagnosis not present

## 2023-11-27 DIAGNOSIS — R7989 Other specified abnormal findings of blood chemistry: Secondary | ICD-10-CM

## 2023-11-27 DIAGNOSIS — Z7984 Long term (current) use of oral hypoglycemic drugs: Secondary | ICD-10-CM

## 2023-11-27 DIAGNOSIS — K219 Gastro-esophageal reflux disease without esophagitis: Secondary | ICD-10-CM

## 2023-11-27 DIAGNOSIS — F1029 Alcohol dependence with unspecified alcohol-induced disorder: Secondary | ICD-10-CM

## 2023-11-27 MED ORDER — LISINOPRIL 20 MG PO TABS: 20.0000 mg | ORAL_TABLET | Freq: Every day | ORAL | 1 refills | Status: DC

## 2023-11-27 MED ORDER — EMPAGLIFLOZIN 25 MG PO TABS
25.0000 mg | ORAL_TABLET | Freq: Every day | ORAL | 1 refills | Status: DC
Start: 2023-11-27 — End: 2024-05-02

## 2023-11-27 NOTE — Assessment & Plan Note (Addendum)
 Physical today 11/26/23. PSA 05/12/23 - .32.  Declines colonoscopy.

## 2023-11-27 NOTE — Progress Notes (Signed)
 Subjective:    [name]: Phillip Shepard, male    DOB: Feb 02, 1966, 58 y.o.   MRN: [id number]  Patient here for  Chief Complaint  Patient presents with   Medical Management of Chronic Issues    HPI Here for a physical exam. On jardiance. Unable to take metformin. Last A1c elevated 10.5. was supposed to start insulin and f/u with me. Did not start. Discussed labs. Discussed low carb diet and exercise. Discussed need for decreased alcohol intake. Does try to stay active. No chest pain or sob reported. No abdominal pain or bowel change reported.  Due colonoscopy. Discussed. Discussed the need for labs. Wants to return for fasting lab check.    Past Medical History:  Diagnosis Date   Allergy    Diabetes mellitus without complication (HCC)    Dysphagia    s/p esophageal dilatation   GERD (gastroesophageal reflux disease)    History of chickenpox    History of kidney stones    Hypercholesterolemia    Hypertension    Kidney stones 07/18/2013   Past Surgical History:  Procedure Laterality Date   ESOPHAGEAL DILATION     TONSILLECTOMY  1970   Family History  Problem Relation Age of Onset   Hyperlipidemia Mother    Hypertension Father    Arthritis Maternal Grandmother    Diabetes Maternal Grandmother    Lung cancer Maternal Grandfather    Heart disease Paternal Grandfather    Stroke Paternal Grandfather    Social History   Socioeconomic History   Marital status: Married    Spouse name: Diane   Number of children: 2   Years of education: Not on file   Highest education level: Not on file  Occupational History    Employer: [employer]  Tobacco Use   Smoking status: Never   Smokeless tobacco: Former    Types: Chew   Tobacco comments:    [place]  Substance and Sexual Activity   Alcohol use: Yes    Alcohol/week: 0.0 standard drinks of alcohol    Comment: drinks 3-4 beers per day   Drug use: No   Sexual activity: Yes  Other Topics Concern   Not on file   Social History Narrative   Not on file   Social Drivers of Health   Financial Resource Strain: Low Risk  (08/10/2021)   Overall Financial Resource Strain (CARDIA)    Difficulty of Paying Living Expenses: Not hard at all  Food Insecurity: Not on file  Transportation Needs: Not on file  Physical Activity: Not on file  Stress: Not on file  Social Connections: Not on file     Review of Systems  Constitutional:  Negative for appetite change and unexpected weight change.  HENT:  Negative for congestion, sinus pressure and sore throat.   Eyes:  Negative for pain and visual disturbance.  Respiratory:  Negative for cough, chest tightness and shortness of breath.   Cardiovascular:  Negative for chest pain, palpitations and leg swelling.  Gastrointestinal:  Negative for abdominal pain, diarrhea, nausea and vomiting.  Genitourinary:  Negative for difficulty urinating and dysuria.  Musculoskeletal:  Negative for joint swelling and myalgias.  Skin:  Negative for color change and rash.  Neurological:  Negative for dizziness and headaches.  Hematological:  Negative for adenopathy. Does not bruise/bleed easily.  Psychiatric/Behavioral:  Negative for agitation and dysphoric mood.        Objective:     BP 132/72   Pulse 85   Temp 98  F (36.7 C)   Resp 16   Ht 5\' 7"  (1.702 m)   Wt 200 lb (90.7 kg)   SpO2 98%   BMI 31.32 kg/m  Wt Readings from Last 3 Encounters:  11/27/23 200 lb (90.7 kg)  05/12/23 200 lb (90.7 kg)  01/28/22 197 lb 9.6 oz (89.6 kg)    Physical Exam Constitutional:      General: He is not in acute distress.    Appearance: Normal appearance. He is well-developed.  HENT:     Head: Normocephalic and atraumatic.     Right Ear: External ear normal.     Left Ear: External ear normal.     Mouth/Throat:     Pharynx: No oropharyngeal exudate or posterior oropharyngeal erythema.  Eyes:     General: No scleral icterus.       Right eye: No discharge.        Left eye:  No discharge.     Conjunctiva/sclera: Conjunctivae normal.  Neck:     Thyroid: No thyromegaly.  Cardiovascular:     Rate and Rhythm: Normal rate and regular rhythm.  Pulmonary:     Effort: No respiratory distress.     Breath sounds: Normal breath sounds. No wheezing.  Abdominal:     General: Bowel sounds are normal.     Palpations: Abdomen is soft.     Tenderness: There is no abdominal tenderness.  Musculoskeletal:        General: No swelling or tenderness.     Cervical back: Neck supple. No tenderness.  Lymphadenopathy:     Cervical: No cervical adenopathy.  Skin:    Findings: No erythema or rash.  Neurological:     Mental Status: He is alert and oriented to person, place, and time.  Psychiatric:        Mood and Affect: Mood normal.        Behavior: Behavior normal.         Outpatient Encounter Medications as of 11/27/2023  Medication Sig   empagliflozin (JARDIANCE) 25 MG TABS tablet Take 1 tablet (25 mg total) by mouth daily.   lisinopril (ZESTRIL) 20 MG tablet Take 1 tablet (20 mg total) by mouth daily.   omeprazole (PRILOSEC) 20 MG capsule Take 20 mg by mouth daily.   [DISCONTINUED] empagliflozin (JARDIANCE) 25 MG TABS tablet TAKE 1 TABLET (25 MG TOTAL) BY MOUTH DAILY.   [DISCONTINUED] lisinopril (ZESTRIL) 20 MG tablet Take 1 tablet (20 mg total) by mouth daily.   No facility-administered encounter medications on file as of 11/27/2023.     Lab Results  Component Value Date   WBC 4.9 05/12/2023   HGB 13.5 05/12/2023   HCT 40.5 05/12/2023   PLT 173.0 05/12/2023   GLUCOSE 287 (H) 05/12/2023   CHOL 301 (H) 05/12/2023   TRIG (H) 05/12/2023    690.0 Triglyceride is over 400; calculations on Lipids are invalid.   HDL 48.30 05/12/2023   LDLDIRECT 164.0 05/12/2023   LDLCALC 129 (H) 02/03/2014   ALT 62 (H) 05/12/2023   AST 43 (H) 05/12/2023   NA 140 05/12/2023   K 4.3 05/12/2023   CL 100 05/12/2023   CREATININE 0.94 05/12/2023   BUN 22 05/12/2023   CO2 26  05/12/2023   TSH 1.71 05/12/2023   PSA 0.32 05/12/2023   HGBA1C 10.5 (H) 05/12/2023   MICROALBUR 2.6 (H) 05/12/2023       Assessment & Plan:  Essential hypertension, benign Assessment & Plan: On lisinopril. Blood pressure as outlined.  Follow pressures and metabolic panel. No change today.   Orders: -     Basic metabolic panel with GFR; Future  Type 2 diabetes mellitus with hyperglycemia, without long-term current use of insulin (HCC) Assessment & Plan: Only on jardiance.  Off ozempic. Discussed today. Did not have problems taking ozempic.  Discussed diet and exercise.  Intolerant to metformin.  Check met b and A1c. Wants to return for labs. Have discussed other treatment options. Low carb diet and exercise. Need to check and record sugars. Not interested in CGM.   Orders: -     Hemoglobin A1c; Future -     Empagliflozin; Take 1 tablet (25 mg total) by mouth daily.  Dispense: 90 tablet; Refill: 1  Hypercholesterolemia Assessment & Plan: No longer on crestor.  Low cholesterol diet and exercise.  Follow lipid panel. Check with fasting labs.  Orders: -     Hepatic function panel; Future -     Lipid panel; Future  Health care maintenance Assessment & Plan: Physical today 11/26/23. PSA 05/12/23 - .32.  Declines colonoscopy.    Alcohol dependence with unspecified alcohol-induced disorder [facility]) Assessment & Plan: Discussed the need to decrease alcohol intake. Follow    B12 deficiency Assessment & Plan: Recheck B12 level with next labs.    Elevated liver function tests Assessment & Plan: Have discussed diet and exercise. Discussed decreased alcohol intake. Recheck liver panel next labs.    Gastroesophageal reflux disease without esophagitis Assessment & Plan: No upper symptoms reported.  Continues on prilosec.    Other orders -     Lisinopril; Take 1 tablet (20 mg total) by mouth daily.  Dispense: 90 tablet; Refill: 1     Dellar Fenton, MD

## 2023-12-03 ENCOUNTER — Encounter: Payer: Self-pay | Admitting: Internal Medicine

## 2023-12-03 NOTE — Assessment & Plan Note (Signed)
 On lisinopril. Blood pressure as outlined.  Follow pressures and metabolic panel. No change today.

## 2023-12-03 NOTE — Assessment & Plan Note (Signed)
 No longer on crestor.  Low cholesterol diet and exercise.  Follow lipid panel. Check with fasting labs.

## 2023-12-03 NOTE — Assessment & Plan Note (Signed)
Recheck B12 level with next labs.  

## 2023-12-03 NOTE — Assessment & Plan Note (Signed)
 No upper symptoms reported. Continues on prilosec.

## 2023-12-03 NOTE — Assessment & Plan Note (Signed)
 Only on jardiance.  Off ozempic. Discussed today. Did not have problems taking ozempic.  Discussed diet and exercise.  Intolerant to metformin.  Check met b and A1c. Wants to return for labs. Have discussed other treatment options. Low carb diet and exercise. Need to check and record sugars. Not interested in CGM.

## 2023-12-03 NOTE — Assessment & Plan Note (Signed)
 Have discussed diet and exercise. Discussed decreased alcohol intake. Recheck liver panel next labs.

## 2023-12-03 NOTE — Assessment & Plan Note (Signed)
Discussed the need to decrease alcohol intake.  Follow.

## 2023-12-14 ENCOUNTER — Other Ambulatory Visit

## 2023-12-14 DIAGNOSIS — I10 Essential (primary) hypertension: Secondary | ICD-10-CM | POA: Diagnosis not present

## 2023-12-14 DIAGNOSIS — Z7984 Long term (current) use of oral hypoglycemic drugs: Secondary | ICD-10-CM | POA: Diagnosis not present

## 2023-12-14 DIAGNOSIS — E7800 Pure hypercholesterolemia, unspecified: Secondary | ICD-10-CM | POA: Diagnosis not present

## 2023-12-14 DIAGNOSIS — E1165 Type 2 diabetes mellitus with hyperglycemia: Secondary | ICD-10-CM

## 2023-12-14 LAB — LIPID PANEL
Cholesterol: 331 mg/dL — ABNORMAL HIGH (ref 0–200)
HDL: 51 mg/dL (ref >39.00)
NonHDL: 280.2
Total CHOL/HDL Ratio: 6
Triglycerides: 509 mg/dL — ABNORMAL HIGH (ref 0.0–149.0)
VLDL: 101.8 mg/dL — ABNORMAL HIGH (ref 0.0–40.0)

## 2023-12-14 LAB — BASIC METABOLIC PANEL WITH GFR
BUN: 25 mg/dL — ABNORMAL HIGH (ref 6–23)
CO2: 26 meq/L (ref 19–32)
Calcium: 9.1 mg/dL (ref 8.4–10.5)
Chloride: 101 meq/L (ref 96–112)
Creatinine, Ser: 1.16 mg/dL (ref 0.40–1.50)
GFR: 69 mL/min (ref >60.00)
Glucose, Bld: 300 mg/dL — ABNORMAL HIGH (ref 70–99)
Potassium: 4.3 meq/L (ref 3.5–5.1)
Sodium: 139 meq/L (ref 135–145)

## 2023-12-14 LAB — HEPATIC FUNCTION PANEL
ALT: 44 U/L (ref 0–53)
AST: 30 U/L (ref 0–37)
Albumin: 4.3 g/dL (ref 3.5–5.2)
Alkaline Phosphatase: 57 U/L (ref 39–117)
Bilirubin, Direct: 0.1 mg/dL (ref 0.0–0.3)
Total Bilirubin: 0.5 mg/dL (ref 0.2–1.2)
Total Protein: 6.9 g/dL (ref 6.0–8.3)

## 2023-12-14 LAB — LDL CHOLESTEROL, DIRECT: Direct LDL: 196 mg/dL

## 2023-12-14 LAB — HEMOGLOBIN A1C: Hgb A1c MFr Bld: 11.4 % — ABNORMAL HIGH (ref 4.6–6.5)

## 2023-12-15 ENCOUNTER — Other Ambulatory Visit: Payer: Self-pay

## 2023-12-15 DIAGNOSIS — E782 Mixed hyperlipidemia: Secondary | ICD-10-CM

## 2023-12-15 MED ORDER — ROSUVASTATIN CALCIUM 10 MG PO TABS
10.0000 mg | ORAL_TABLET | Freq: Every day | ORAL | 0 refills | Status: DC
Start: 2023-12-15 — End: 2024-02-27

## 2023-12-21 ENCOUNTER — Ambulatory Visit: Admitting: Internal Medicine

## 2023-12-21 VITALS — BP 138/78 | HR 88 | Temp 98.0°F | Resp 16 | Ht 67.0 in | Wt 193.4 lb

## 2023-12-21 DIAGNOSIS — E7800 Pure hypercholesterolemia, unspecified: Secondary | ICD-10-CM

## 2023-12-21 DIAGNOSIS — F1029 Alcohol dependence with unspecified alcohol-induced disorder: Secondary | ICD-10-CM

## 2023-12-21 DIAGNOSIS — I10 Essential (primary) hypertension: Secondary | ICD-10-CM | POA: Diagnosis not present

## 2023-12-21 DIAGNOSIS — K219 Gastro-esophageal reflux disease without esophagitis: Secondary | ICD-10-CM

## 2023-12-21 DIAGNOSIS — E1165 Type 2 diabetes mellitus with hyperglycemia: Secondary | ICD-10-CM

## 2023-12-21 DIAGNOSIS — Z7984 Long term (current) use of oral hypoglycemic drugs: Secondary | ICD-10-CM

## 2023-12-21 DIAGNOSIS — R7989 Other specified abnormal findings of blood chemistry: Secondary | ICD-10-CM

## 2023-12-21 MED ORDER — INSULIN DEGLUDEC 100 UNIT/ML ~~LOC~~ SOPN: 10.0000 [IU] | PEN_INJECTOR | Freq: Every day | SUBCUTANEOUS | 2 refills | Status: DC

## 2023-12-21 NOTE — Progress Notes (Unsigned)
 Subjective:    [name]: Phillip Shepard, male    DOB: 30-Jul-1966, 58 y.o.   MRN: [id number]  Patient here for  Chief Complaint  Patient presents with   Diabetes    HPI Here to discuss recent labs - elevated A1c and treatment for diabetes. A1c recent check 11.4.  has not been watching his diet. Not checking sugars. Is taking jardiance . Discussed treatment options. Given significant elevation in sugars, discussed the need to start insulin . He is under a lot of stress currently. Wife is in the hospital. He is not eating regularly now. Discussed diet and exercise. No chest pain.  Breathing stable. No abdominal pain or bowel issues reported. Discussed starting cholesterol medication.    Past Medical History:  Diagnosis Date   Allergy    Diabetes mellitus without complication (HCC)    Dysphagia    s/p esophageal dilatation   GERD (gastroesophageal reflux disease)    History of chickenpox    History of kidney stones    Hypercholesterolemia    Hypertension    Kidney stones 07/18/2013   Past Surgical History:  Procedure Laterality Date   ESOPHAGEAL DILATION     TONSILLECTOMY  1970   Family History  Problem Relation Age of Onset   Hyperlipidemia Mother    Hypertension Father    Arthritis Maternal Grandmother    Diabetes Maternal Grandmother    Lung cancer Maternal Grandfather    Heart disease Paternal Grandfather    Stroke Paternal Grandfather    Social History   Socioeconomic History   Marital status: Married    Spouse name: Diane   Number of children: 2   Years of education: Not on file   Highest education level: Not on file  Occupational History    Employer: [employer]  Tobacco Use   Smoking status: Never   Smokeless tobacco: Former    Types: Chew   Tobacco comments:    [place]  Substance and Sexual Activity   Alcohol use: Yes    Alcohol/week: 0.0 standard drinks of alcohol    Comment: drinks 3-4 beers per day   Drug use: No   Sexual activity:  Yes  Other Topics Concern   Not on file  Social History Narrative   Not on file   Social Drivers of Health   Financial Resource Strain: Low Risk  (08/10/2021)   Overall Financial Resource Strain (CARDIA)    Difficulty of Paying Living Expenses: Not hard at all  Food Insecurity: Not on file  Transportation Needs: Not on file  Physical Activity: Not on file  Stress: Not on file  Social Connections: Not on file     Review of Systems  Constitutional:  Negative for appetite change and unexpected weight change.  HENT:  Negative for congestion and sinus pressure.   Respiratory:  Negative for cough, chest tightness and shortness of breath.   Cardiovascular:  Negative for chest pain, palpitations and leg swelling.  Gastrointestinal:  Negative for abdominal pain, diarrhea, nausea and vomiting.  Genitourinary:  Negative for difficulty urinating and dysuria.  Musculoskeletal:  Negative for joint swelling and myalgias.  Skin:  Negative for color change and rash.  Neurological:  Negative for dizziness and headaches.  Psychiatric/Behavioral:  Negative for agitation and dysphoric mood.        Increased stress as outlined.        Objective:     BP 138/78   Pulse 88   Temp 98 F (36.7 C)   Resp  16   Ht 5\' 7"  (1.702 m)   Wt 193 lb 6.4 oz (87.7 kg)   SpO2 99%   BMI 30.29 kg/m  Wt Readings from Last 3 Encounters:  12/21/23 193 lb 6.4 oz (87.7 kg)  11/27/23 200 lb (90.7 kg)  05/12/23 200 lb (90.7 kg)    Physical Exam Vitals reviewed.  Constitutional:      General: He is not in acute distress.    Appearance: Normal appearance. He is well-developed.  HENT:     Head: Normocephalic and atraumatic.     Right Ear: External ear normal.     Left Ear: External ear normal.     Mouth/Throat:     Pharynx: No oropharyngeal exudate or posterior oropharyngeal erythema.  Eyes:     General: No scleral icterus.       Right eye: No discharge.        Left eye: No discharge.      Conjunctiva/sclera: Conjunctivae normal.  Cardiovascular:     Rate and Rhythm: Normal rate and regular rhythm.  Pulmonary:     Effort: Pulmonary effort is normal. No respiratory distress.     Breath sounds: Normal breath sounds.  Abdominal:     General: Bowel sounds are normal.     Palpations: Abdomen is soft.     Tenderness: There is no abdominal tenderness.  Musculoskeletal:        General: No swelling or tenderness.     Cervical back: Neck supple. No tenderness.  Lymphadenopathy:     Cervical: No cervical adenopathy.  Skin:    Findings: No erythema or rash.  Neurological:     Mental Status: He is alert.  Psychiatric:        Mood and Affect: Mood normal.        Behavior: Behavior normal.         Outpatient Encounter Medications as of 12/21/2023  Medication Sig   empagliflozin  (JARDIANCE ) 25 MG TABS tablet Take 1 tablet (25 mg total) by mouth daily.   insulin  degludec (TRESIBA ) 100 UNIT/ML FlexTouch Pen Inject 10 Units into the skin daily.   lisinopril  (ZESTRIL ) 20 MG tablet Take 1 tablet (20 mg total) by mouth daily.   omeprazole (PRILOSEC) 20 MG capsule Take 20 mg by mouth daily.   rosuvastatin  (CRESTOR ) 10 MG tablet Take 1 tablet (10 mg total) by mouth daily. (Patient not taking: Reported on 12/21/2023)   No facility-administered encounter medications on file as of 12/21/2023.     Lab Results  Component Value Date   WBC 4.9 05/12/2023   HGB 13.5 05/12/2023   HCT 40.5 05/12/2023   PLT 173.0 05/12/2023   GLUCOSE 300 (H) 12/14/2023   CHOL 331 (H) 12/14/2023   TRIG (H) 12/14/2023    509.0 Triglyceride is over 400; calculations on Lipids are invalid.   HDL 51.00 12/14/2023   LDLDIRECT 196.0 12/14/2023   LDLCALC 129 (H) 02/03/2014   ALT 44 12/14/2023   AST 30 12/14/2023   NA 139 12/14/2023   K 4.3 12/14/2023   CL 101 12/14/2023   CREATININE 1.16 12/14/2023   BUN 25 (H) 12/14/2023   CO2 26 12/14/2023   TSH 1.71 05/12/2023   PSA 0.32 05/12/2023   HGBA1C 11.4 (H)  12/14/2023   MICROALBUR 2.6 (H) 05/12/2023       Assessment & Plan:  Type 2 diabetes mellitus with hyperglycemia, without long-term current use of insulin  (HCC) Assessment & Plan: Only on jardiance . Previously on ozempic . Had no problems taking  ozempic . Recent A1c 11.4. discussed elevated sugars. Discussed treatment options. Given elevated sugars (significant elevation), will need to start insulin . Given his dietary change now, will start with 10 units of tresiba . Needs to follow sugars. Will need to titrate insulin  dose to help get sugars under control. Discussed diet and exercise. Plan to hopefully obtain better control and be able to restart GLP 1 agonist.    Alcohol dependence with unspecified alcohol-induced disorder [facility]) Assessment & Plan: Have discussed the need to decrease alcohol intake. Follow.    Elevated liver function tests Assessment & Plan: Recent recheck liver panel wnl. Follow. Continue diet and exercise.    Essential hypertension, benign Assessment & Plan: Continue lisinopril . Follow pressures. Follow metabolic panel. Blood pressure recheck 120/72.    Gastroesophageal reflux disease without esophagitis Assessment & Plan: No upper symptoms reported. Continue prilosec.    Hypercholesterolemia Assessment & Plan: Restart crestor . Low cholesterol diet and exercise. Follow lipid panel and liver function tests.    Other orders -     Insulin  Degludec; Inject 10 Units into the skin daily.  Dispense: 3 mL; Refill: 2     Dellar Fenton, MD

## 2023-12-24 ENCOUNTER — Encounter: Payer: Self-pay | Admitting: Internal Medicine

## 2023-12-24 NOTE — Assessment & Plan Note (Signed)
 Have discussed the need to decrease alcohol intake. Follow.

## 2023-12-24 NOTE — Assessment & Plan Note (Signed)
Restart crestor.  Low cholesterol diet and exercise.  Follow lipid panel and liver function tests.

## 2023-12-24 NOTE — Assessment & Plan Note (Signed)
 Continue lisinopril . Follow pressures. Follow metabolic panel. Blood pressure recheck 120/72.

## 2023-12-24 NOTE — Assessment & Plan Note (Signed)
No upper symptoms reported.  Continue prilosec.  

## 2023-12-24 NOTE — Assessment & Plan Note (Signed)
 Only on jardiance . Previously on ozempic . Had no problems taking ozempic . Recent A1c 11.4. discussed elevated sugars. Discussed treatment options. Given elevated sugars (significant elevation), will need to start insulin . Given his dietary change now, will start with 10 units of tresiba . Needs to follow sugars. Will need to titrate insulin  dose to help get sugars under control. Discussed diet and exercise. Plan to hopefully obtain better control and be able to restart GLP 1 agonist.

## 2023-12-24 NOTE — Assessment & Plan Note (Signed)
 Recent recheck liver panel wnl. Follow. Continue diet and exercise.

## 2024-02-27 ENCOUNTER — Encounter: Payer: Self-pay | Admitting: Internal Medicine

## 2024-02-27 ENCOUNTER — Ambulatory Visit: Admitting: Internal Medicine

## 2024-02-27 VITALS — BP 120/62 | HR 81 | Temp 98.4°F | Resp 20 | Ht 67.0 in | Wt 197.5 lb

## 2024-02-27 DIAGNOSIS — R7989 Other specified abnormal findings of blood chemistry: Secondary | ICD-10-CM | POA: Diagnosis not present

## 2024-02-27 DIAGNOSIS — E7800 Pure hypercholesterolemia, unspecified: Secondary | ICD-10-CM | POA: Diagnosis not present

## 2024-02-27 DIAGNOSIS — I152 Hypertension secondary to endocrine disorders: Secondary | ICD-10-CM

## 2024-02-27 DIAGNOSIS — I10 Essential (primary) hypertension: Secondary | ICD-10-CM

## 2024-02-27 DIAGNOSIS — E1165 Type 2 diabetes mellitus with hyperglycemia: Secondary | ICD-10-CM

## 2024-02-27 DIAGNOSIS — Z7984 Long term (current) use of oral hypoglycemic drugs: Secondary | ICD-10-CM

## 2024-02-27 DIAGNOSIS — F1029 Alcohol dependence with unspecified alcohol-induced disorder: Secondary | ICD-10-CM

## 2024-02-27 DIAGNOSIS — D7589 Other specified diseases of blood and blood-forming organs: Secondary | ICD-10-CM

## 2024-02-27 DIAGNOSIS — E1159 Type 2 diabetes mellitus with other circulatory complications: Secondary | ICD-10-CM

## 2024-02-27 DIAGNOSIS — K219 Gastro-esophageal reflux disease without esophagitis: Secondary | ICD-10-CM

## 2024-02-27 MED ORDER — ROSUVASTATIN CALCIUM 10 MG PO TABS: 10.0000 mg | ORAL_TABLET | Freq: Every day | ORAL | 0 refills | Status: DC

## 2024-02-27 NOTE — Progress Notes (Signed)
 Subjective:    [name]: Phillip Shepard, male    DOB: 1966/06/29, 58 y.o.   MRN: [id number]  Patient here for  Chief Complaint  Patient presents with   Medical Management of Chronic Issues    3 month follow up     HPI Here for a scheduled follow up - follow up regarding diabetes, hypertension and hypercholesterolemia. Last A1c 11.4. started on tresiba . Also discussed starting crestor . He is not taking crestor . States pharmacy did nt have rx. Reports sugars are running 97-220. Discussed low carb diet and exercise. Currently on 14 units of tresiba . No chest pain or sob reported. No abdominal pain or bowel change reported. Handling stress.    Past Medical History:  Diagnosis Date   Allergy    Diabetes mellitus without complication (HCC)    Dysphagia    s/p esophageal dilatation   GERD (gastroesophageal reflux disease)    History of chickenpox    History of kidney stones    Hypercholesterolemia    Hypertension    Kidney stones 07/18/2013   Past Surgical History:  Procedure Laterality Date   ESOPHAGEAL DILATION     TONSILLECTOMY  1970   Family History  Problem Relation Age of Onset   Hyperlipidemia Mother    Hypertension Father    Arthritis Maternal Grandmother    Diabetes Maternal Grandmother    Lung cancer Maternal Grandfather    Heart disease Paternal Grandfather    Stroke Paternal Grandfather    Social History   Socioeconomic History   Marital status: Married    Spouse name: Diane   Number of children: 2   Years of education: Not on file   Highest education level: Not on file  Occupational History    Employer: [employer]  Tobacco Use   Smoking status: Never   Smokeless tobacco: Former    Types: Chew   Tobacco comments:    [place]  Substance and Sexual Activity   Alcohol use: Yes    Alcohol/week: 0.0 standard drinks of alcohol    Comment: drinks 3-4 beers per day   Drug use: No   Sexual activity: Yes  Other Topics Concern   Not on file   Social History Narrative   Not on file   Social Drivers of Health   Financial Resource Strain: Low Risk  (08/10/2021)   Overall Financial Resource Strain (CARDIA)    Difficulty of Paying Living Expenses: Not hard at all  Food Insecurity: Not on file  Transportation Needs: Not on file  Physical Activity: Not on file  Stress: Not on file  Social Connections: Not on file     Review of Systems  Constitutional:  Negative for appetite change and unexpected weight change.  HENT:  Negative for congestion and sinus pressure.   Respiratory:  Negative for cough, chest tightness and shortness of breath.   Cardiovascular:  Negative for chest pain, palpitations and leg swelling.  Gastrointestinal:  Negative for abdominal pain, diarrhea, nausea and vomiting.  Genitourinary:  Negative for difficulty urinating and dysuria.  Musculoskeletal:  Negative for joint swelling and myalgias.  Skin:  Negative for color change and rash.  Neurological:  Negative for dizziness and headaches.  Psychiatric/Behavioral:  Negative for agitation and dysphoric mood.        Objective:     BP 120/62   Pulse 81   Temp 98.4 F (36.9 C)   Resp 20   Ht 5' 7 (1.702 m)   Wt 197 lb 8 oz (  89.6 kg)   SpO2 98%   BMI 30.93 kg/m  Wt Readings from Last 3 Encounters:  02/27/24 197 lb 8 oz (89.6 kg)  12/21/23 193 lb 6.4 oz (87.7 kg)  11/27/23 200 lb (90.7 kg)    Physical Exam Vitals reviewed.  Constitutional:      General: He is not in acute distress.    Appearance: Normal appearance. He is well-developed.  HENT:     Head: Normocephalic and atraumatic.     Right Ear: External ear normal.     Left Ear: External ear normal.     Mouth/Throat:     Pharynx: No oropharyngeal exudate or posterior oropharyngeal erythema.  Eyes:     General: No scleral icterus.       Right eye: No discharge.        Left eye: No discharge.     Conjunctiva/sclera: Conjunctivae normal.  Cardiovascular:     Rate and Rhythm: Normal  rate and regular rhythm.  Pulmonary:     Effort: Pulmonary effort is normal. No respiratory distress.     Breath sounds: Normal breath sounds.  Abdominal:     General: Bowel sounds are normal.     Palpations: Abdomen is soft.     Tenderness: There is no abdominal tenderness.  Musculoskeletal:        General: No swelling or tenderness.     Cervical back: Neck supple. No tenderness.  Lymphadenopathy:     Cervical: No cervical adenopathy.  Skin:    Findings: No erythema or rash.  Neurological:     Mental Status: He is alert.  Psychiatric:        Mood and Affect: Mood normal.        Behavior: Behavior normal.         Outpatient Encounter Medications as of 02/27/2024  Medication Sig   empagliflozin  (JARDIANCE ) 25 MG TABS tablet Take 1 tablet (25 mg total) by mouth daily.   insulin  degludec (TRESIBA ) 100 UNIT/ML FlexTouch Pen Inject 10 Units into the skin daily.   lisinopril  (ZESTRIL ) 20 MG tablet Take 1 tablet (20 mg total) by mouth daily.   omeprazole (PRILOSEC) 20 MG capsule Take 20 mg by mouth daily.   rosuvastatin  (CRESTOR ) 10 MG tablet Take 1 tablet (10 mg total) by mouth daily.   [DISCONTINUED] rosuvastatin  (CRESTOR ) 10 MG tablet Take 1 tablet (10 mg total) by mouth daily. (Patient not taking: Reported on 02/27/2024)   No facility-administered encounter medications on file as of 02/27/2024.     Lab Results  Component Value Date   WBC 4.9 05/12/2023   HGB 13.5 05/12/2023   HCT 40.5 05/12/2023   PLT 173.0 05/12/2023   GLUCOSE 300 (H) 12/14/2023   CHOL 331 (H) 12/14/2023   TRIG (H) 12/14/2023    509.0 Triglyceride is over 400; calculations on Lipids are invalid.   HDL 51.00 12/14/2023   LDLDIRECT 196.0 12/14/2023   LDLCALC 129 (H) 02/03/2014   ALT 44 12/14/2023   AST 30 12/14/2023   NA 139 12/14/2023   K 4.3 12/14/2023   CL 101 12/14/2023   CREATININE 1.16 12/14/2023   BUN 25 (H) 12/14/2023   CO2 26 12/14/2023   TSH 1.71 05/12/2023   PSA 0.32 05/12/2023   HGBA1C  11.4 (H) 12/14/2023   MICROALBUR 0.8 03/18/2020       Assessment & Plan:  Type 2 diabetes mellitus with hyperglycemia, without long-term current use of insulin  (HCC) Assessment & Plan: Only on jardiance . Previously on ozempic . Had  no problems taking ozempic . Recent A1c 11.4. discussed elevated sugars. Discussed treatment options. Given elevated sugars (significant elevation), was started on insulin .Plan to hopefully obtain better control and be able to restart GLP 1 agonist. Low carb diet and exercise. Currently also on tresiba  14 units q day. Follow met b and A1c.   Orders: -     Basic metabolic panel with GFR; Future -     Hemoglobin A1c; Future -     Microalbumin / creatinine urine ratio; Future  Essential hypertension, benign Assessment & Plan: Continue lisinopril . Follow pressures. Follow metabolic panel. Blood pressure recheck 126/72.    Elevated liver function tests Assessment & Plan: Low carb diet and exercise. Check liver panel. Weight loss. Discussed the need for better sugar control.    Hypercholesterolemia Assessment & Plan: Restart crestor . Low cholesterol diet and exercise. Follow lipid panel and liver function tests. Check lipid panel with next labs.    Orders: -     CBC with Differential/Platelet; Future -     Hepatic function panel; Future -     Lipid panel; Future  Hypertension associated with diabetes (HCC) Assessment & Plan: Blood pressure as outlined.  Recheck 126/72.  On lisinopril .  Follow pressure.  Follow metabolic panel.    Macrocytosis -     Vitamin B12; Future  Alcohol dependence with unspecified alcohol-induced disorder [facility]) Assessment & Plan: He has decreased his alcohol intake. Follow. Discussed.    Gastroesophageal reflux disease without esophagitis Assessment & Plan: No upper symptoms reported. Continue prilosec.    Other orders -     Rosuvastatin  Calcium ; Take 1 tablet (10 mg total) by mouth daily.  Dispense: 90 tablet; Refill:  0     Allena Hamilton, MD

## 2024-03-03 ENCOUNTER — Encounter: Payer: Self-pay | Admitting: Internal Medicine

## 2024-03-03 NOTE — Assessment & Plan Note (Signed)
 Restart crestor . Low cholesterol diet and exercise. Follow lipid panel and liver function tests. Check lipid panel with next labs.

## 2024-03-03 NOTE — Assessment & Plan Note (Signed)
 Continue lisinopril . Follow pressures. Follow metabolic panel. Blood pressure recheck 126/72.

## 2024-03-03 NOTE — Assessment & Plan Note (Signed)
 Only on jardiance . Previously on ozempic . Had no problems taking ozempic . Recent A1c 11.4. discussed elevated sugars. Discussed treatment options. Given elevated sugars (significant elevation), was started on insulin .Plan to hopefully obtain better control and be able to restart GLP 1 agonist. Low carb diet and exercise. Currently also on tresiba  14 units q day. Follow met b and A1c.

## 2024-03-03 NOTE — Assessment & Plan Note (Signed)
No upper symptoms reported.  Continue prilosec.  

## 2024-03-03 NOTE — Assessment & Plan Note (Signed)
 Low carb diet and exercise. Check liver panel. Weight loss. Discussed the need for better sugar control.

## 2024-03-03 NOTE — Assessment & Plan Note (Signed)
 Blood pressure as outlined.  Recheck 126/72.  On lisinopril .  Follow pressure.  Follow metabolic panel.

## 2024-03-03 NOTE — Assessment & Plan Note (Signed)
 He has decreased his alcohol intake. Follow. Discussed.

## 2024-03-28 ENCOUNTER — Other Ambulatory Visit

## 2024-04-02 ENCOUNTER — Other Ambulatory Visit (INDEPENDENT_AMBULATORY_CARE_PROVIDER_SITE_OTHER)

## 2024-04-02 DIAGNOSIS — E7800 Pure hypercholesterolemia, unspecified: Secondary | ICD-10-CM

## 2024-04-02 DIAGNOSIS — E1165 Type 2 diabetes mellitus with hyperglycemia: Secondary | ICD-10-CM | POA: Diagnosis not present

## 2024-04-02 DIAGNOSIS — D7589 Other specified diseases of blood and blood-forming organs: Secondary | ICD-10-CM

## 2024-04-02 LAB — LIPID PANEL
Cholesterol: 192 mg/dL (ref 0–200)
HDL: 47.9 mg/dL (ref >39.00)
LDL Cholesterol: 76 mg/dL (ref 0–99)
NonHDL: 144.16
Total CHOL/HDL Ratio: 4
Triglycerides: 339 mg/dL — ABNORMAL HIGH (ref 0.0–149.0)
VLDL: 67.8 mg/dL — ABNORMAL HIGH (ref 0.0–40.0)

## 2024-04-02 LAB — CBC WITH DIFFERENTIAL/PLATELET
Basophils Absolute: 0.1 K/uL (ref 0.0–0.1)
Basophils Relative: 1 % (ref 0.0–3.0)
Eosinophils Absolute: 0.4 K/uL (ref 0.0–0.7)
Eosinophils Relative: 6.4 % — ABNORMAL HIGH (ref 0.0–5.0)
HCT: 43.4 % (ref 39.0–52.0)
Hemoglobin: 14.5 g/dL (ref 13.0–17.0)
Lymphocytes Relative: 21.9 % (ref 12.0–46.0)
Lymphs Abs: 1.3 K/uL (ref 0.7–4.0)
MCHC: 33.4 g/dL (ref 30.0–36.0)
MCV: 96.9 fl (ref 78.0–100.0)
Monocytes Absolute: 0.6 K/uL (ref 0.1–1.0)
Monocytes Relative: 9.7 % (ref 3.0–12.0)
Neutro Abs: 3.5 K/uL (ref 1.4–7.7)
Neutrophils Relative %: 61 % (ref 43.0–77.0)
Platelets: 185 K/uL (ref 150.0–400.0)
RBC: 4.48 Mil/uL (ref 4.22–5.81)
RDW: 13.2 % (ref 11.5–15.5)
WBC: 5.8 K/uL (ref 4.0–10.5)

## 2024-04-02 LAB — HEPATIC FUNCTION PANEL
ALT: 50 U/L (ref 0–53)
AST: 31 U/L (ref 0–37)
Albumin: 4.7 g/dL (ref 3.5–5.2)
Alkaline Phosphatase: 59 U/L (ref 39–117)
Bilirubin, Direct: 0.1 mg/dL (ref 0.0–0.3)
Total Bilirubin: 0.5 mg/dL (ref 0.2–1.2)
Total Protein: 7.1 g/dL (ref 6.0–8.3)

## 2024-04-02 LAB — VITAMIN B12: Vitamin B-12: 192 pg/mL — ABNORMAL LOW (ref 211–911)

## 2024-04-02 LAB — BASIC METABOLIC PANEL WITH GFR
BUN: 19 mg/dL (ref 6–23)
CO2: 29 meq/L (ref 19–32)
Calcium: 9.4 mg/dL (ref 8.4–10.5)
Chloride: 104 meq/L (ref 96–112)
Creatinine, Ser: 0.91 mg/dL (ref 0.40–1.50)
GFR: 92.14 mL/min (ref >60.00)
Glucose, Bld: 228 mg/dL — ABNORMAL HIGH (ref 70–99)
Potassium: 4.5 meq/L (ref 3.5–5.1)
Sodium: 144 meq/L (ref 135–145)

## 2024-04-02 LAB — MICROALBUMIN / CREATININE URINE RATIO
Creatinine,U: 47.5 mg/dL
Microalb Creat Ratio: 77.2 mg/g — ABNORMAL HIGH (ref 0.0–30.0)
Microalb, Ur: 3.7 mg/dL — ABNORMAL HIGH (ref 0.0–1.9)

## 2024-04-02 LAB — HEMOGLOBIN A1C: Hgb A1c MFr Bld: 8.7 % — ABNORMAL HIGH (ref 4.6–6.5)

## 2024-04-11 ENCOUNTER — Ambulatory Visit: Payer: Self-pay | Admitting: Internal Medicine

## 2024-04-18 MED ORDER — INSULIN DEGLUDEC 100 UNIT/ML ~~LOC~~ SOPN
10.0000 [IU] | PEN_INJECTOR | Freq: Every day | SUBCUTANEOUS | 2 refills | Status: AC
Start: 2024-04-18 — End: ?

## 2024-04-19 ENCOUNTER — Ambulatory Visit (INDEPENDENT_AMBULATORY_CARE_PROVIDER_SITE_OTHER)

## 2024-04-19 DIAGNOSIS — E538 Deficiency of other specified B group vitamins: Secondary | ICD-10-CM

## 2024-04-19 MED ORDER — CYANOCOBALAMIN 1000 MCG/ML IJ SOLN
1000.0000 ug | Freq: Once | INTRAMUSCULAR | Status: AC
  Administered 2024-04-19: 1000 ug via INTRAMUSCULAR

## 2024-04-19 NOTE — Progress Notes (Signed)
Pt received B12 injection in Left deltoid. Pt tolerated it well with no complaints or concerns.

## 2024-04-26 ENCOUNTER — Ambulatory Visit (INDEPENDENT_AMBULATORY_CARE_PROVIDER_SITE_OTHER)

## 2024-04-26 DIAGNOSIS — E538 Deficiency of other specified B group vitamins: Secondary | ICD-10-CM | POA: Diagnosis not present

## 2024-04-26 MED ORDER — CYANOCOBALAMIN 1000 MCG/ML IJ SOLN
1000.0000 ug | Freq: Once | INTRAMUSCULAR | Status: AC
  Administered 2024-04-26: 1000 ug via INTRAMUSCULAR

## 2024-04-26 NOTE — Progress Notes (Signed)
Pt received B12 injection in right deltoid muscle. Pt tolerated it well with no complaints or concerns.  

## 2024-05-02 ENCOUNTER — Encounter: Payer: Self-pay | Admitting: Internal Medicine

## 2024-05-02 ENCOUNTER — Ambulatory Visit (INDEPENDENT_AMBULATORY_CARE_PROVIDER_SITE_OTHER): Admitting: Internal Medicine

## 2024-05-02 VITALS — BP 138/78 | HR 88 | Resp 16 | Ht 67.0 in | Wt 202.0 lb

## 2024-05-02 DIAGNOSIS — F1029 Alcohol dependence with unspecified alcohol-induced disorder: Secondary | ICD-10-CM

## 2024-05-02 DIAGNOSIS — E538 Deficiency of other specified B group vitamins: Secondary | ICD-10-CM

## 2024-05-02 DIAGNOSIS — K219 Gastro-esophageal reflux disease without esophagitis: Secondary | ICD-10-CM

## 2024-05-02 DIAGNOSIS — E7800 Pure hypercholesterolemia, unspecified: Secondary | ICD-10-CM | POA: Diagnosis not present

## 2024-05-02 DIAGNOSIS — Z7985 Long-term (current) use of injectable non-insulin antidiabetic drugs: Secondary | ICD-10-CM

## 2024-05-02 DIAGNOSIS — Z7984 Long term (current) use of oral hypoglycemic drugs: Secondary | ICD-10-CM

## 2024-05-02 DIAGNOSIS — E1165 Type 2 diabetes mellitus with hyperglycemia: Secondary | ICD-10-CM | POA: Diagnosis not present

## 2024-05-02 DIAGNOSIS — Z125 Encounter for screening for malignant neoplasm of prostate: Secondary | ICD-10-CM | POA: Diagnosis not present

## 2024-05-02 DIAGNOSIS — I10 Essential (primary) hypertension: Secondary | ICD-10-CM

## 2024-05-02 DIAGNOSIS — Z794 Long term (current) use of insulin: Secondary | ICD-10-CM

## 2024-05-02 LAB — HM DIABETES FOOT EXAM

## 2024-05-02 MED ORDER — LISINOPRIL 20 MG PO TABS: 20.0000 mg | ORAL_TABLET | Freq: Every day | ORAL | 1 refills | Status: AC

## 2024-05-02 MED ORDER — CYANOCOBALAMIN 1000 MCG/ML IJ SOLN
1000.0000 ug | Freq: Once | INTRAMUSCULAR | Status: AC
  Administered 2024-05-02: 1000 ug via INTRAMUSCULAR

## 2024-05-02 MED ORDER — SEMAGLUTIDE(0.25 OR 0.5MG/DOS) 2 MG/3ML ~~LOC~~ SOPN: 0.2500 mg | PEN_INJECTOR | SUBCUTANEOUS | 2 refills | Status: AC

## 2024-05-02 MED ORDER — ROSUVASTATIN CALCIUM 10 MG PO TABS: 10.0000 mg | ORAL_TABLET | Freq: Every day | ORAL | 1 refills | Status: AC

## 2024-05-02 MED ORDER — EMPAGLIFLOZIN 25 MG PO TABS
25.0000 mg | ORAL_TABLET | Freq: Every day | ORAL | 1 refills | Status: AC
Start: 2024-05-02 — End: ?

## 2024-05-02 NOTE — Assessment & Plan Note (Signed)
 Continue B12 injections.

## 2024-05-02 NOTE — Assessment & Plan Note (Signed)
Has decreased alcohol intake.  Follow.  

## 2024-05-02 NOTE — Assessment & Plan Note (Signed)
 Continue lisinopril . Follow pressures. Follow metabolic panel.  No changes in medication today.

## 2024-05-02 NOTE — Assessment & Plan Note (Signed)
No upper symptoms reported.  Continue prilosec.  

## 2024-05-02 NOTE — Progress Notes (Signed)
 Subjective:    [name]: Phillip Shepard, male    DOB: Mar 27, 1966, 58 y.o.   MRN: [id number]  Patient here for  Chief Complaint  Patient presents with   Medical Management of Chronic Issues    HPI Here for a scheduled follow up - follow up regarding diabetes, hypertension and hypercholesterolemia. Recent A1c improved - 8.7 down form A1c 11.4. currently on tresiba  and jardiance . Had previously been on GLP 1 agonist. Tolerated. Brought in no sugar readings today. States sugar has been averaging 160-200 during the day. Discussed diet and exercise. No chest pain or sob reported. No abdominal pain or bowel change. Agreeable to start back on ozempic . Tolerated.    Past Medical History:  Diagnosis Date   Allergy    Diabetes mellitus without complication (HCC)    Dysphagia    s/p esophageal dilatation   GERD (gastroesophageal reflux disease)    History of chickenpox    History of kidney stones    Hypercholesterolemia    Hypertension    Kidney stones 07/18/2013   Past Surgical History:  Procedure Laterality Date   ESOPHAGEAL DILATION     TONSILLECTOMY  1970   Family History  Problem Relation Age of Onset   Hyperlipidemia Mother    Hypertension Father    Arthritis Maternal Grandmother    Diabetes Maternal Grandmother    Lung cancer Maternal Grandfather    Heart disease Paternal Grandfather    Stroke Paternal Grandfather    Social History   Socioeconomic History   Marital status: Married    Spouse name: Diane   Number of children: 2   Years of education: Not on file   Highest education level: Not on file  Occupational History    Employer: [employer]  Tobacco Use   Smoking status: Never   Smokeless tobacco: Former    Types: Chew   Tobacco comments:    [place]  Substance and Sexual Activity   Alcohol use: Yes    Alcohol/week: 0.0 standard drinks of alcohol    Comment: drinks 3-4 beers per day   Drug use: No   Sexual activity: Yes  Other Topics Concern    Not on file  Social History Narrative   Not on file   Social Drivers of Health   Financial Resource Strain: Low Risk  (08/10/2021)   Overall Financial Resource Strain (CARDIA)    Difficulty of Paying Living Expenses: Not hard at all  Food Insecurity: Not on file  Transportation Needs: Not on file  Physical Activity: Not on file  Stress: Not on file  Social Connections: Not on file     Review of Systems  Constitutional:  Negative for appetite change and unexpected weight change.  HENT:  Negative for congestion and sinus pressure.   Respiratory:  Negative for cough, chest tightness and shortness of breath.   Cardiovascular:  Negative for chest pain, palpitations and leg swelling.  Gastrointestinal:  Negative for abdominal pain, diarrhea, nausea and vomiting.  Genitourinary:  Negative for difficulty urinating and dysuria.  Musculoskeletal:  Negative for joint swelling and myalgias.  Skin:  Negative for color change and rash.  Neurological:  Negative for dizziness and headaches.  Psychiatric/Behavioral:  Negative for agitation and dysphoric mood.        Objective:     BP 138/78   Pulse 88   Resp 16   Ht 5' 7 (1.702 m)   Wt 202 lb (91.6 kg)   SpO2 98%   BMI 31.64  kg/m  Wt Readings from Last 3 Encounters:  05/02/24 202 lb (91.6 kg)  02/27/24 197 lb 8 oz (89.6 kg)  12/21/23 193 lb 6.4 oz (87.7 kg)    Physical Exam Vitals reviewed.  Constitutional:      General: He is not in acute distress.    Appearance: Normal appearance. He is well-developed.  HENT:     Head: Normocephalic and atraumatic.     Right Ear: External ear normal.     Left Ear: External ear normal.     Mouth/Throat:     Pharynx: No oropharyngeal exudate or posterior oropharyngeal erythema.  Eyes:     General: No scleral icterus.       Right eye: No discharge.        Left eye: No discharge.     Conjunctiva/sclera: Conjunctivae normal.  Cardiovascular:     Rate and Rhythm: Normal rate and  regular rhythm.  Pulmonary:     Effort: Pulmonary effort is normal. No respiratory distress.     Breath sounds: Normal breath sounds.  Abdominal:     General: Bowel sounds are normal.     Palpations: Abdomen is soft.     Tenderness: There is no abdominal tenderness.  Musculoskeletal:        General: No swelling or tenderness.     Cervical back: Neck supple. No tenderness.  Lymphadenopathy:     Cervical: No cervical adenopathy.  Skin:    Findings: No erythema or rash.  Neurological:     Mental Status: He is alert.  Psychiatric:        Mood and Affect: Mood normal.        Behavior: Behavior normal.      Diabetic foot exam was performed with the following findings:   No deformities, ulcerations, or other skin breakdown Normal sensation of 10g monofilament Intact posterior tibialis and dorsalis pedis pulses      Outpatient Encounter Medications as of 05/02/2024  Medication Sig   Semaglutide ,0.25 or 0.5MG /DOS, 2 MG/3ML SOPN Inject 0.25 mg into the skin once a week.   empagliflozin  (JARDIANCE ) 25 MG TABS tablet Take 1 tablet (25 mg total) by mouth daily.   insulin  degludec (TRESIBA ) 100 UNIT/ML FlexTouch Pen Inject 10 Units into the skin daily.   lisinopril  (ZESTRIL ) 20 MG tablet Take 1 tablet (20 mg total) by mouth daily.   omeprazole (PRILOSEC) 20 MG capsule Take 20 mg by mouth daily.   rosuvastatin  (CRESTOR ) 10 MG tablet Take 1 tablet (10 mg total) by mouth daily.   [DISCONTINUED] empagliflozin  (JARDIANCE ) 25 MG TABS tablet Take 1 tablet (25 mg total) by mouth daily.   [DISCONTINUED] lisinopril  (ZESTRIL ) 20 MG tablet Take 1 tablet (20 mg total) by mouth daily.   [DISCONTINUED] rosuvastatin  (CRESTOR ) 10 MG tablet Take 1 tablet (10 mg total) by mouth daily.   [EXPIRED] cyanocobalamin  (VITAMIN B12) injection 1,000 mcg    No facility-administered encounter medications on file as of 05/02/2024.     Lab Results  Component Value Date   WBC 5.8 04/02/2024   HGB 14.5 04/02/2024    HCT 43.4 04/02/2024   PLT 185.0 04/02/2024   GLUCOSE 228 (H) 04/02/2024   CHOL 192 04/02/2024   TRIG 339.0 (H) 04/02/2024   HDL 47.90 04/02/2024   LDLDIRECT 196.0 12/14/2023   LDLCALC 76 04/02/2024   ALT 50 04/02/2024   AST 31 04/02/2024   NA 144 04/02/2024   K 4.5 04/02/2024   CL 104 04/02/2024   CREATININE 0.91 04/02/2024  BUN 19 04/02/2024   CO2 29 04/02/2024   TSH 1.71 05/12/2023   PSA 0.32 05/12/2023   HGBA1C 8.7 (H) 04/02/2024   MICROALBUR 3.7 (H) 04/02/2024       Assessment & Plan:  B12 deficiency Assessment & Plan: Continue B12 injections.   Orders: -     Cyanocobalamin   Hypercholesterolemia Assessment & Plan: Continue crestor . Low cholesterol diet and exercise. Follow lipid panel and liver function tests. Check lipid panel with next labs.  Triglycerides improved on recent check. Follow.   Orders: -     Hepatic function panel; Future -     Lipid panel; Future  Type 2 diabetes mellitus with hyperglycemia, without long-term current use of insulin  (HCC) Assessment & Plan: Only on jardiance . Previously on ozempic . Had no problems taking ozempic . Previous A1c 11.4. Most recent check improved - 8.7. Discussed elevated sugars. Discussed treatment options. Currently on tresiba . Will continue. Start ozempic  .25mg  weekly.  Low carb diet and exercise.  Follow sugars. Send in readings. Call with update. Follow met b and A1c.   Orders: -     Basic metabolic panel with GFR; Future -     Hemoglobin A1c; Future -     Empagliflozin ; Take 1 tablet (25 mg total) by mouth daily.  Dispense: 90 tablet; Refill: 1  Prostate cancer screening -     PSA; Future  Essential hypertension, benign Assessment & Plan: Continue lisinopril . Follow pressures. Follow metabolic panel.  No changes in medication today.   Orders: -     TSH; Future  Alcohol dependence with unspecified alcohol-induced disorder (HCC) Assessment & Plan: Has decreased alcohol intake. Follow.     Gastroesophageal reflux disease without esophagitis Assessment & Plan: No upper symptoms reported. Continue prilosec.    Other orders -     Lisinopril ; Take 1 tablet (20 mg total) by mouth daily.  Dispense: 90 tablet; Refill: 1 -     Rosuvastatin  Calcium ; Take 1 tablet (10 mg total) by mouth daily.  Dispense: 90 tablet; Refill: 1 -     Semaglutide (0.25 or 0.5MG /DOS); Inject 0.25 mg into the skin once a week.  Dispense: 3 mL; Refill: 2     Allena Hamilton, MD

## 2024-05-02 NOTE — Assessment & Plan Note (Signed)
 Only on jardiance . Previously on ozempic . Had no problems taking ozempic . Previous A1c 11.4. Most recent check improved - 8.7. Discussed elevated sugars. Discussed treatment options. Currently on tresiba . Will continue. Start ozempic  .25mg  weekly.  Low carb diet and exercise.  Follow sugars. Send in readings. Call with update. Follow met b and A1c.

## 2024-05-02 NOTE — Assessment & Plan Note (Signed)
 Continue crestor . Low cholesterol diet and exercise. Follow lipid panel and liver function tests. Check lipid panel with next labs.  Triglycerides improved on recent check. Follow.

## 2024-05-03 ENCOUNTER — Other Ambulatory Visit (HOSPITAL_COMMUNITY): Payer: Self-pay

## 2024-05-03 ENCOUNTER — Telehealth: Payer: Self-pay

## 2024-05-03 ENCOUNTER — Ambulatory Visit

## 2024-05-03 NOTE — Telephone Encounter (Signed)
 Pharmacy Patient Advocate Encounter   Received notification from CoverMyMeds that prior authorization for Ozempic  (0.25 or 0.5 MG/DOSE) 2MG /3ML pen-injectorsis required/requested.   Insurance verification completed.   The patient is insured through [facility] .   Per test claim: PA required; PA submitted to above mentioned insurance via Latent Key/confirmation #/EOC [facility] Status is pending

## 2024-05-06 NOTE — Telephone Encounter (Signed)
 Left detailed message for patient.

## 2024-05-06 NOTE — Telephone Encounter (Signed)
 Pharmacy Patient Advocate Encounter  Received notification from [facility] that Prior Authorization for Ozempic  (0.25 or 0.5 MG/DOSE) 2MG /3ML pen-injectors  has been APPROVED from 05/06/2024 to 05/07/2027   PA #/Case ID/Reference #: [id number]

## 2024-05-07 ENCOUNTER — Ambulatory Visit: Admitting: Internal Medicine

## 2024-05-10 ENCOUNTER — Ambulatory Visit

## 2024-05-10 ENCOUNTER — Other Ambulatory Visit

## 2024-05-10 ENCOUNTER — Other Ambulatory Visit: Payer: Self-pay

## 2024-05-10 DIAGNOSIS — E538 Deficiency of other specified B group vitamins: Secondary | ICD-10-CM | POA: Diagnosis not present

## 2024-05-10 DIAGNOSIS — E7800 Pure hypercholesterolemia, unspecified: Secondary | ICD-10-CM

## 2024-05-10 DIAGNOSIS — Z125 Encounter for screening for malignant neoplasm of prostate: Secondary | ICD-10-CM

## 2024-05-10 DIAGNOSIS — I10 Essential (primary) hypertension: Secondary | ICD-10-CM

## 2024-05-10 DIAGNOSIS — E1165 Type 2 diabetes mellitus with hyperglycemia: Secondary | ICD-10-CM

## 2024-05-10 MED ORDER — CYANOCOBALAMIN 1000 MCG/ML IJ SOLN
1000.0000 ug | Freq: Once | INTRAMUSCULAR | Status: AC
  Administered 2024-05-10: 1000 ug via INTRAMUSCULAR

## 2024-05-10 NOTE — Addendum Note (Signed)
 Addended by: MARYLEN PRO A on: 05/10/2024 04:04 PM   Modules accepted: Orders

## 2024-05-10 NOTE — Progress Notes (Signed)
Pt received B12 injection in right deltoid muscle. Pt tolerated it well with no complaints or concerns.  

## 2024-05-10 NOTE — Addendum Note (Signed)
 Addended by: MARYLEN PRO A on: 05/10/2024 03:59 PM   Modules accepted: Orders

## 2024-06-17 ENCOUNTER — Ambulatory Visit

## 2024-06-17 DIAGNOSIS — E538 Deficiency of other specified B group vitamins: Secondary | ICD-10-CM

## 2024-06-17 MED ORDER — CYANOCOBALAMIN 1000 MCG/ML IJ SOLN
1000.0000 ug | Freq: Once | INTRAMUSCULAR | Status: AC
  Administered 2024-06-17: 1000 ug via INTRAMUSCULAR

## 2024-06-17 NOTE — Progress Notes (Signed)
 Pt presented for their vitamin B12 injection. Pt was identified through two identifiers. Pt tolerated shot well in their left deltoid.

## 2024-07-08 ENCOUNTER — Telehealth: Payer: Self-pay

## 2024-07-08 NOTE — Telephone Encounter (Signed)
 Received response from [facility], see note

## 2024-07-09 NOTE — Telephone Encounter (Signed)
 Received note from [facility]. States has not been seen since 2023. Please clarify with him if he has been anywhere else for eye exam. If not, notify is overdue and needs to schedule.  Notification in box. Can send to scan once notified.

## 2024-07-09 NOTE — Telephone Encounter (Signed)
 I do not see a note attached. What note is this referring to?

## 2024-07-10 LAB — OPHTHALMOLOGY REPORT-SCANNED

## 2024-07-11 ENCOUNTER — Encounter: Payer: Self-pay | Admitting: Internal Medicine

## 2024-07-11 ENCOUNTER — Ambulatory Visit: Admitting: Internal Medicine

## 2024-07-11 ENCOUNTER — Encounter: Payer: Self-pay | Admitting: *Deleted

## 2024-07-11 DIAGNOSIS — E1165 Type 2 diabetes mellitus with hyperglycemia: Secondary | ICD-10-CM

## 2024-07-11 NOTE — Progress Notes (Signed)
 [name]: Phillip Shepard, male   DOB: 10/28/1965, 58 y.o.   MRN: [id number] Did not show for appt.

## 2024-07-11 NOTE — Progress Notes (Deleted)
 Subjective:    [name]: Phillip Shepard, male    DOB: 1965-11-18, 58 y.o.   MRN: [id number]  Patient here for No chief complaint on file.   HPI Here for a scheduled follow up - follow up regarding diabetes, hypertension and hypercholesterolemia. Recent A1c improved - 8.7 down form A1c 11.4. has been on tresiba  and jardiance .  last visit, was started back on ozempic .    Past Medical History:  Diagnosis Date   Allergy    Diabetes mellitus without complication (HCC)    Dysphagia    s/p esophageal dilatation   GERD (gastroesophageal reflux disease)    History of chickenpox    History of kidney stones    Hypercholesterolemia    Hypertension    Kidney stones 07/18/2013   Past Surgical History:  Procedure Laterality Date   ESOPHAGEAL DILATION     TONSILLECTOMY  1970   Family History  Problem Relation Age of Onset   Hyperlipidemia Mother    Hypertension Father    Arthritis Maternal Grandmother    Diabetes Maternal Grandmother    Lung cancer Maternal Grandfather    Heart disease Paternal Grandfather    Stroke Paternal Grandfather    Social History   Socioeconomic History   Marital status: Married    Spouse name: Diane   Number of children: 2   Years of education: Not on file   Highest education level: Not on file  Occupational History    Employer: [employer]  Tobacco Use   Smoking status: Never   Smokeless tobacco: Former    Types: Chew   Tobacco comments:    [place]  Substance and Sexual Activity   Alcohol use: Yes    Alcohol/week: 0.0 standard drinks of alcohol    Comment: drinks 3-4 beers per day   Drug use: No   Sexual activity: Yes  Other Topics Concern   Not on file  Social History Narrative   Not on file   Social Drivers of Health   Financial Resource Strain: Low Risk  (08/10/2021)   Overall Financial Resource Strain (CARDIA)    Difficulty of Paying Living Expenses: Not hard at all  Food Insecurity: Not on file  Transportation  Needs: Not on file  Physical Activity: Not on file  Stress: Not on file  Social Connections: Not on file     Review of Systems     Objective:     There were no vitals taken for this visit. Wt Readings from Last 3 Encounters:  05/02/24 202 lb (91.6 kg)  02/27/24 197 lb 8 oz (89.6 kg)  12/21/23 193 lb 6.4 oz (87.7 kg)    Physical Exam  {Perform Simple Foot Exam  Perform Detailed exam:1} {Insert foot Exam (Optional):30965}   Outpatient Encounter Medications as of 07/11/2024  Medication Sig   empagliflozin  (JARDIANCE ) 25 MG TABS tablet Take 1 tablet (25 mg total) by mouth daily.   insulin  degludec (TRESIBA ) 100 UNIT/ML FlexTouch Pen Inject 10 Units into the skin daily.   lisinopril  (ZESTRIL ) 20 MG tablet Take 1 tablet (20 mg total) by mouth daily.   omeprazole (PRILOSEC) 20 MG capsule Take 20 mg by mouth daily.   rosuvastatin  (CRESTOR ) 10 MG tablet Take 1 tablet (10 mg total) by mouth daily.   Semaglutide ,0.25 or 0.5MG /DOS, 2 MG/3ML SOPN Inject 0.25 mg into the skin once a week.   No facility-administered encounter medications on file as of 07/11/2024.     Lab Results  Component Value Date  WBC 5.8 04/02/2024   HGB 14.5 04/02/2024   HCT 43.4 04/02/2024   PLT 185.0 04/02/2024   GLUCOSE 228 (H) 04/02/2024   CHOL 192 04/02/2024   TRIG 339.0 (H) 04/02/2024   HDL 47.90 04/02/2024   LDLDIRECT 196.0 12/14/2023   LDLCALC 76 04/02/2024   ALT 50 04/02/2024   AST 31 04/02/2024   NA 144 04/02/2024   K 4.5 04/02/2024   CL 104 04/02/2024   CREATININE 0.91 04/02/2024   BUN 19 04/02/2024   CO2 29 04/02/2024   TSH 1.71 05/12/2023   PSA 0.32 05/12/2023   HGBA1C 8.7 (H) 04/02/2024   MICROALBUR 3.7 (H) 04/02/2024    CT Abdomen Pelvis Wo Contrast Result Date: 01/10/2007 **** PRIOR REPORT IMPORTED FROM AN EXTERNAL SYSTEM **** PRIOR REPORT IMPORTED FROM THE SYNGO WORKFLOW SYSTEM REASON FOR EXAM:    (1) L flank and abdominal pain,  stone protocol; (2) L flank and abd pain  COMMENTS: PROCEDURE:     CT  - CT ABDOMEN AND PELVIS W0  - Jan 10 2007  9:23AM RESULT:     The study was performed without IV contrast as requested. The patient is complaining of LEFT flank discomfort. There is mild hydronephrosis on the LEFT which is likely secondary to a stone at the LEFT ureteropelvic junction which measures approximately 5 mm in diameter. There is an additional, approximately 3 mm diameter stone causing no obstruction lying in the LEFT renal pelvis. The RIGHT kidney exhibits sub millimeter stones, but no evidence of obstruction. The liver, gallbladder, spleen, nondistended stomach, pancreas, and adrenal glands are normal in appearance. The caliber of the abdominal aorta is normal. The unopacified loops of small and large bowel exhibit no acute abnormality. The urinary bladder and prostate gland and seminal vesicles are normal in appearance. The appendix is demonstrated and is normal in caliber. The lung bases exhibit mild fibrosis in the posterior costophrenic gutters. IMPRESSION: 1. There is very mild hydronephrosis on the LEFT, which is likely secondary to an approximately 5 mm diameter stone at the ureteropelvic junction. An additional 3 mm diameter non-obstructing stone in the LEFT renal pelvis is seen. No obstruction on the RIGHT is identified. 2. I see no acute abnormality elsewhere within the abdomen or pelvis. The findings were called to the Emergency Department the conclusion of the study. Thank you for the opportunity to contribute to the care of your patient.        Assessment & Plan:  Type 2 diabetes mellitus with hyperglycemia, without long-term current use of insulin  (HCC)     Allena Hamilton, MD

## 2024-07-22 ENCOUNTER — Ambulatory Visit
